# Patient Record
Sex: Female | Born: 1980 | Race: Black or African American | Hispanic: No | Marital: Married | Smoking: Never smoker
Health system: Southern US, Community
[De-identification: ages and names within clinical notes are randomized; demographics above are authoritative.]

## PROBLEM LIST (undated history)

## (undated) ENCOUNTER — Inpatient Hospital Stay (HOSPITAL_COMMUNITY): Payer: Self-pay

## (undated) DIAGNOSIS — R202 Paresthesia of skin: Secondary | ICD-10-CM

## (undated) DIAGNOSIS — M797 Fibromyalgia: Secondary | ICD-10-CM

## (undated) DIAGNOSIS — Z973 Presence of spectacles and contact lenses: Secondary | ICD-10-CM

## (undated) DIAGNOSIS — R200 Anesthesia of skin: Secondary | ICD-10-CM

## (undated) DIAGNOSIS — R110 Nausea: Secondary | ICD-10-CM

## (undated) DIAGNOSIS — I10 Essential (primary) hypertension: Secondary | ICD-10-CM

## (undated) DIAGNOSIS — M329 Systemic lupus erythematosus, unspecified: Secondary | ICD-10-CM

## (undated) DIAGNOSIS — K219 Gastro-esophageal reflux disease without esophagitis: Secondary | ICD-10-CM

## (undated) DIAGNOSIS — F419 Anxiety disorder, unspecified: Secondary | ICD-10-CM

## (undated) DIAGNOSIS — Z87898 Personal history of other specified conditions: Secondary | ICD-10-CM

## (undated) DIAGNOSIS — M351 Other overlap syndromes: Secondary | ICD-10-CM

## (undated) HISTORY — DX: Anxiety disorder, unspecified: F41.9

## (undated) HISTORY — PX: LIVER BIOPSY: SHX301

## (undated) HISTORY — DX: Anesthesia of skin: R20.0

## (undated) HISTORY — DX: Fibromyalgia: M79.7

## (undated) HISTORY — DX: Systemic lupus erythematosus, unspecified: M32.9

## (undated) HISTORY — DX: Paresthesia of skin: R20.2

## (undated) HISTORY — PX: BIOPSY STOMACH: PRO33

---

## 2001-01-10 ENCOUNTER — Other Ambulatory Visit: Admission: RE | Admit: 2001-01-10 | Discharge: 2001-01-10 | Payer: Self-pay | Admitting: Family Medicine

## 2002-05-20 ENCOUNTER — Encounter: Admission: RE | Admit: 2002-05-20 | Discharge: 2002-05-20 | Payer: Self-pay | Admitting: Family Medicine

## 2002-05-20 ENCOUNTER — Encounter: Payer: Self-pay | Admitting: Family Medicine

## 2003-06-12 ENCOUNTER — Inpatient Hospital Stay (HOSPITAL_COMMUNITY): Admission: AD | Admit: 2003-06-12 | Discharge: 2003-06-12 | Payer: Self-pay | Admitting: Obstetrics & Gynecology

## 2003-06-26 ENCOUNTER — Encounter: Admission: RE | Admit: 2003-06-26 | Discharge: 2003-06-26 | Payer: Self-pay | Admitting: Family Medicine

## 2003-07-03 ENCOUNTER — Encounter: Payer: Self-pay | Admitting: Family Medicine

## 2003-07-03 ENCOUNTER — Encounter: Admission: RE | Admit: 2003-07-03 | Discharge: 2003-07-03 | Payer: Self-pay | Admitting: Family Medicine

## 2003-07-08 ENCOUNTER — Inpatient Hospital Stay (HOSPITAL_COMMUNITY): Admission: AD | Admit: 2003-07-08 | Discharge: 2003-07-09 | Payer: Self-pay | Admitting: *Deleted

## 2003-08-06 ENCOUNTER — Encounter: Admission: RE | Admit: 2003-08-06 | Discharge: 2003-08-06 | Payer: Self-pay | Admitting: Sports Medicine

## 2003-08-12 ENCOUNTER — Encounter: Admission: RE | Admit: 2003-08-12 | Discharge: 2003-08-12 | Payer: Self-pay | Admitting: Family Medicine

## 2003-08-16 ENCOUNTER — Inpatient Hospital Stay (HOSPITAL_COMMUNITY): Admission: AD | Admit: 2003-08-16 | Discharge: 2003-08-16 | Payer: Self-pay | Admitting: Obstetrics & Gynecology

## 2003-09-27 ENCOUNTER — Inpatient Hospital Stay (HOSPITAL_COMMUNITY): Admission: AD | Admit: 2003-09-27 | Discharge: 2003-09-27 | Payer: Self-pay | Admitting: Obstetrics

## 2003-10-18 ENCOUNTER — Inpatient Hospital Stay (HOSPITAL_COMMUNITY): Admission: AD | Admit: 2003-10-18 | Discharge: 2003-10-18 | Payer: Self-pay | Admitting: Obstetrics

## 2003-12-22 ENCOUNTER — Inpatient Hospital Stay (HOSPITAL_COMMUNITY): Admission: AD | Admit: 2003-12-22 | Discharge: 2003-12-22 | Payer: Self-pay | Admitting: Obstetrics

## 2004-01-01 ENCOUNTER — Inpatient Hospital Stay (HOSPITAL_COMMUNITY): Admission: AD | Admit: 2004-01-01 | Discharge: 2004-01-01 | Payer: Self-pay | Admitting: Obstetrics

## 2004-01-03 ENCOUNTER — Inpatient Hospital Stay (HOSPITAL_COMMUNITY): Admission: AD | Admit: 2004-01-03 | Discharge: 2004-01-03 | Payer: Self-pay | Admitting: Obstetrics

## 2004-01-06 ENCOUNTER — Inpatient Hospital Stay (HOSPITAL_COMMUNITY): Admission: AD | Admit: 2004-01-06 | Discharge: 2004-01-06 | Payer: Self-pay | Admitting: Obstetrics

## 2004-01-08 ENCOUNTER — Inpatient Hospital Stay (HOSPITAL_COMMUNITY): Admission: AD | Admit: 2004-01-08 | Discharge: 2004-01-08 | Payer: Self-pay | Admitting: Obstetrics

## 2004-01-09 ENCOUNTER — Inpatient Hospital Stay (HOSPITAL_COMMUNITY): Admission: AD | Admit: 2004-01-09 | Discharge: 2004-01-11 | Payer: Self-pay | Admitting: Obstetrics

## 2004-01-20 ENCOUNTER — Inpatient Hospital Stay (HOSPITAL_COMMUNITY): Admission: AD | Admit: 2004-01-20 | Discharge: 2004-01-23 | Payer: Self-pay | Admitting: Obstetrics

## 2004-10-25 ENCOUNTER — Emergency Department (HOSPITAL_COMMUNITY): Admission: EM | Admit: 2004-10-25 | Discharge: 2004-10-25 | Payer: Self-pay | Admitting: Family Medicine

## 2005-05-01 ENCOUNTER — Emergency Department (HOSPITAL_COMMUNITY): Admission: EM | Admit: 2005-05-01 | Discharge: 2005-05-01 | Payer: Self-pay | Admitting: Emergency Medicine

## 2005-07-27 ENCOUNTER — Other Ambulatory Visit: Admission: RE | Admit: 2005-07-27 | Discharge: 2005-07-27 | Payer: Self-pay | Admitting: Family Medicine

## 2005-11-16 ENCOUNTER — Emergency Department (HOSPITAL_COMMUNITY): Admission: EM | Admit: 2005-11-16 | Discharge: 2005-11-16 | Payer: Self-pay | Admitting: Family Medicine

## 2006-05-06 ENCOUNTER — Emergency Department (HOSPITAL_COMMUNITY): Admission: EM | Admit: 2006-05-06 | Discharge: 2006-05-06 | Payer: Self-pay | Admitting: Family Medicine

## 2006-05-09 ENCOUNTER — Emergency Department (HOSPITAL_COMMUNITY): Admission: EM | Admit: 2006-05-09 | Discharge: 2006-05-09 | Payer: Self-pay | Admitting: Family Medicine

## 2006-05-12 ENCOUNTER — Encounter: Admission: RE | Admit: 2006-05-12 | Discharge: 2006-05-12 | Payer: Self-pay | Admitting: Family Medicine

## 2006-06-26 ENCOUNTER — Emergency Department (HOSPITAL_COMMUNITY): Admission: EM | Admit: 2006-06-26 | Discharge: 2006-06-27 | Payer: Self-pay | Admitting: Family Medicine

## 2006-10-17 ENCOUNTER — Emergency Department (HOSPITAL_COMMUNITY): Admission: EM | Admit: 2006-10-17 | Discharge: 2006-10-17 | Payer: Self-pay | Admitting: Family Medicine

## 2007-01-11 ENCOUNTER — Emergency Department (HOSPITAL_COMMUNITY): Admission: EM | Admit: 2007-01-11 | Discharge: 2007-01-11 | Payer: Self-pay | Admitting: Family Medicine

## 2007-02-07 ENCOUNTER — Other Ambulatory Visit: Admission: RE | Admit: 2007-02-07 | Discharge: 2007-02-07 | Payer: Self-pay | Admitting: Obstetrics and Gynecology

## 2007-04-11 ENCOUNTER — Emergency Department (HOSPITAL_COMMUNITY): Admission: EM | Admit: 2007-04-11 | Discharge: 2007-04-11 | Payer: Self-pay | Admitting: Emergency Medicine

## 2008-05-04 IMAGING — CT CT PELVIS W/ CM
4 of 5 series · 12 of 46 positions shown, 19 images · IV contrast (READY/WATER & [ID] OMNI 300)
Comparison: none

CLINICAL DATA: Left abdomen and left flank pain.  Some nausea and vomiting.
 ABDOMEN CT WITH CONTRAST:
TECHNIQUE: Multidetector CT imaging of the abdomen was performed following the standard protocol during bolus administration of intravenous contrast.
 Contrast:  100 cc Omnipaque 300 and oral contrast.
TECHNIQUE: Multidetector CT imaging of the pelvis was performed following the standard protocol during bolus administration of intravenous contrast.

[Series 2: a&p w/ · axial · 0.62mm/px · z∈[-337,-77]mm · 5 of 80 slices shown, 10 images (1 of 2)]
[im 14/80  soft-tissue]
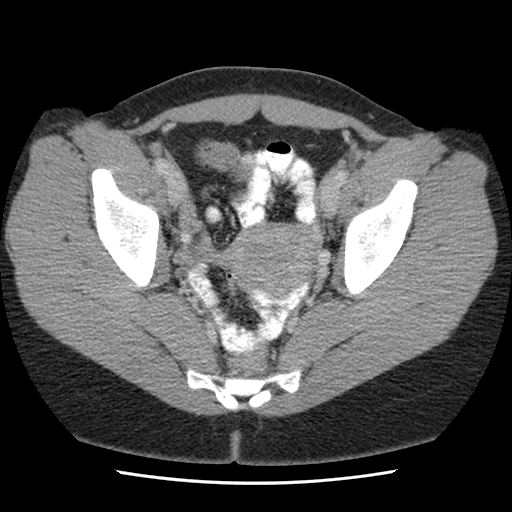
[im 14/80  bone]
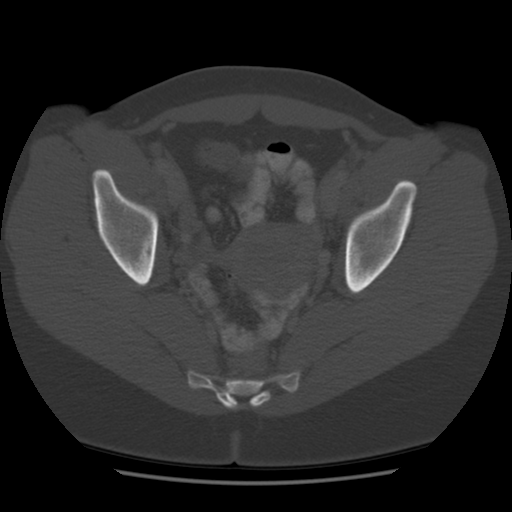
[im 27/80  soft-tissue]
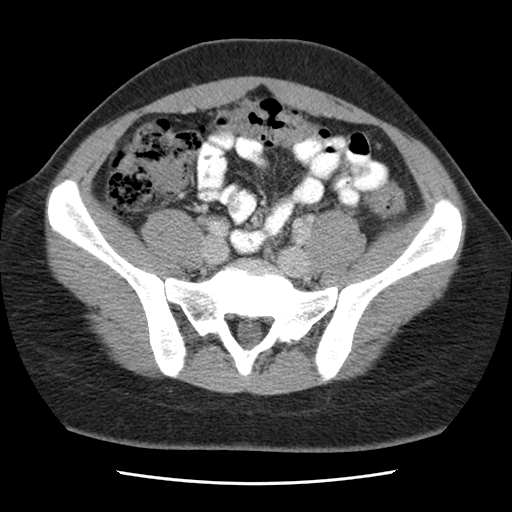
[im 27/80  lung]
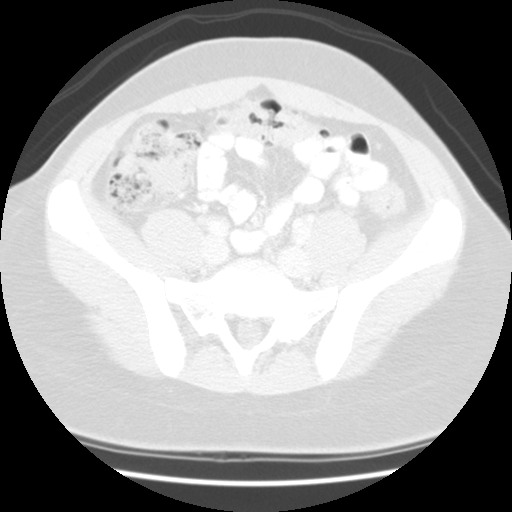
[im 40/80  soft-tissue]
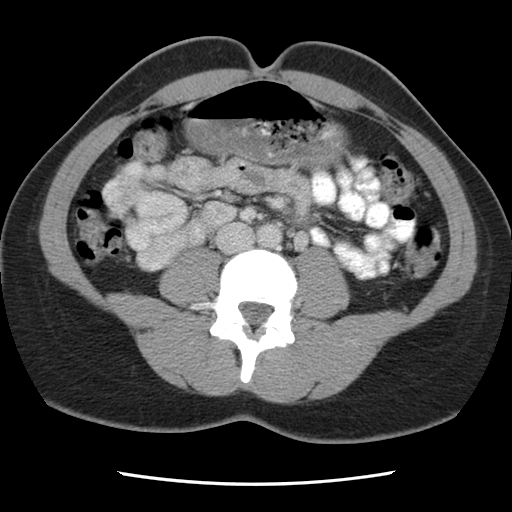
[im 40/80  lung]
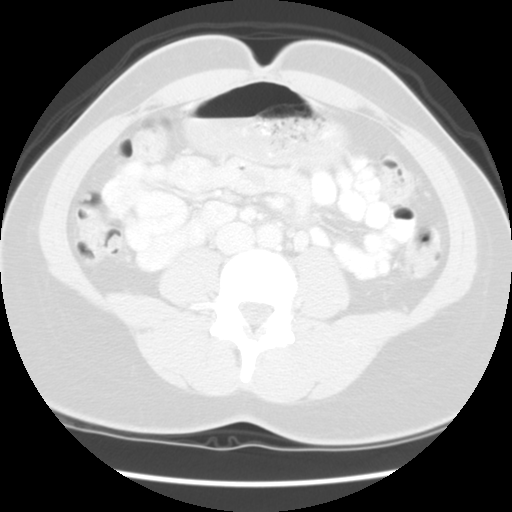
[im 53/80  soft-tissue]
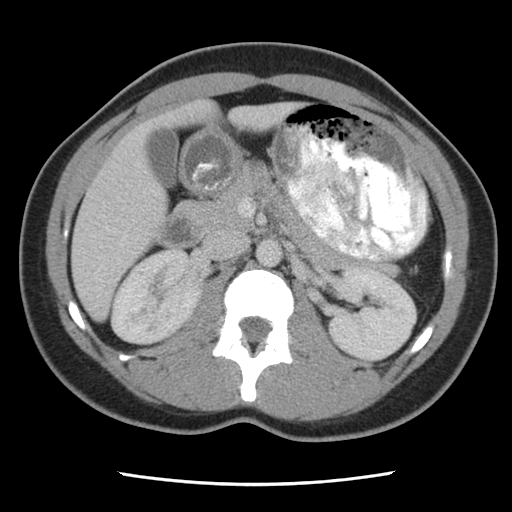
[im 53/80  lung]
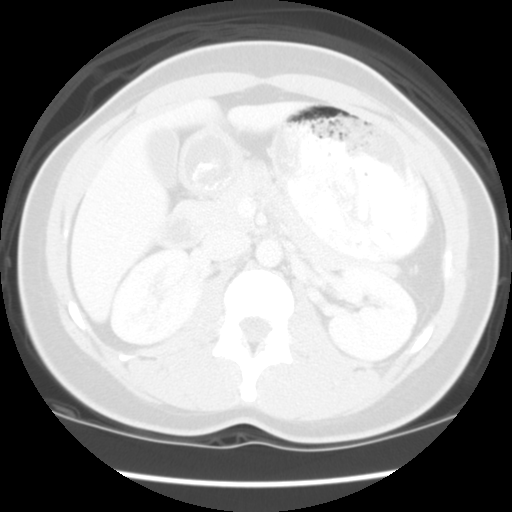
[im 66/80  soft-tissue]
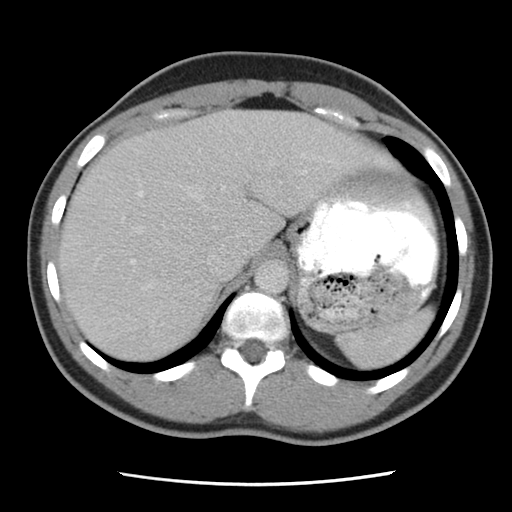
[im 66/80  lung]
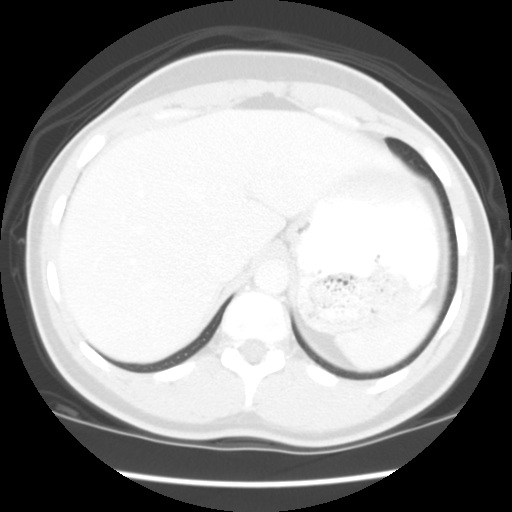

[Series 102: a&p w/ · axial · 0.62mm/px · z∈[-343,-260]mm · 3 of 296 slices shown (2 of 2)]
[im 27/296  soft-tissue]
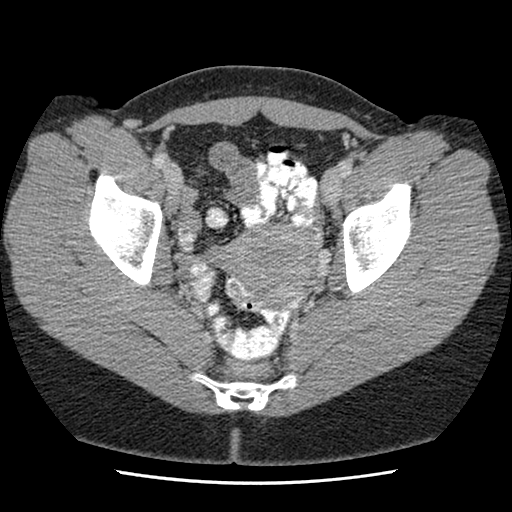
[im 54/296  soft-tissue]
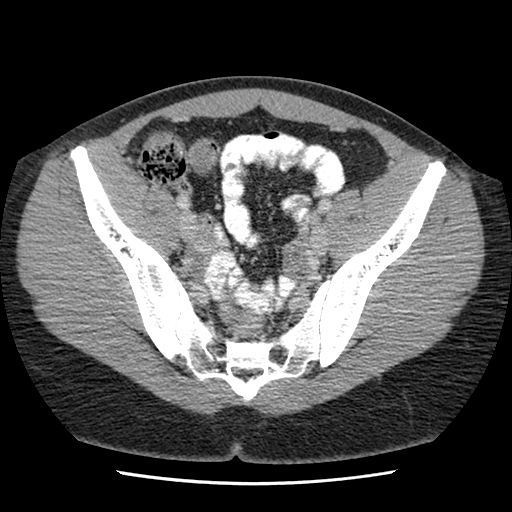
[im 94/296  soft-tissue]
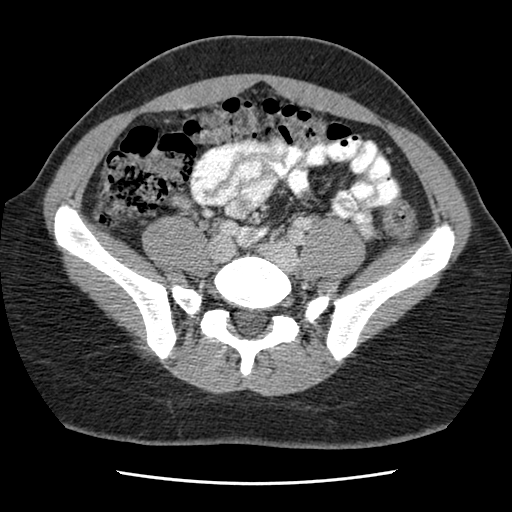

[Series 789: reformatted · sagittal · 0.82mm/px · 3 of 112 slices shown, 4 images (1 of 2)]
[im 38/112  soft-tissue]
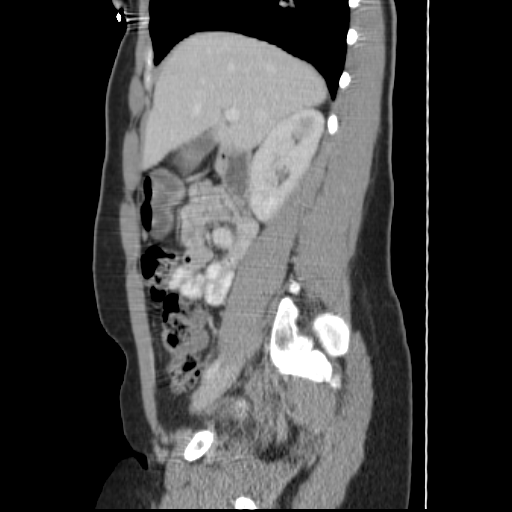
[im 50/112  soft-tissue]
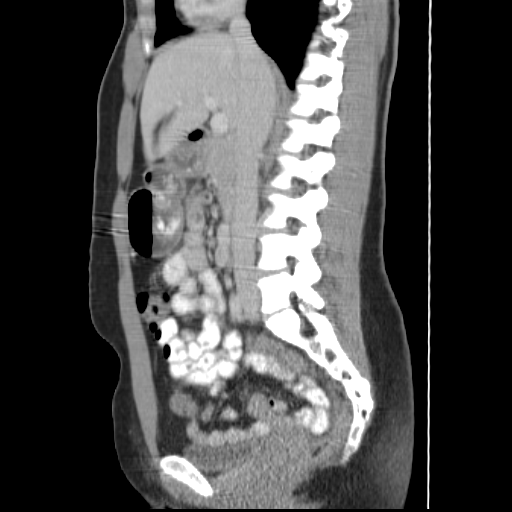
[im 50/112  bone]
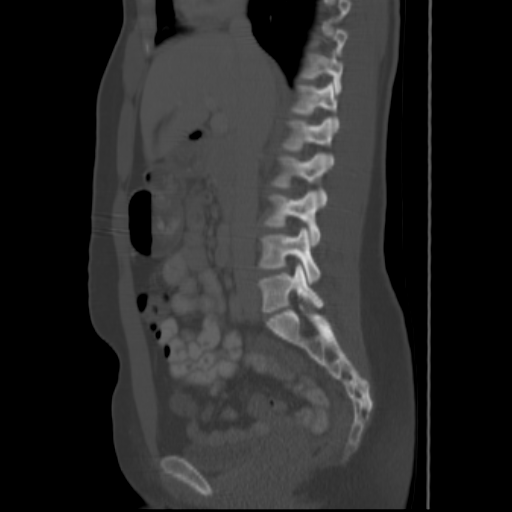
[im 62/112  soft-tissue]
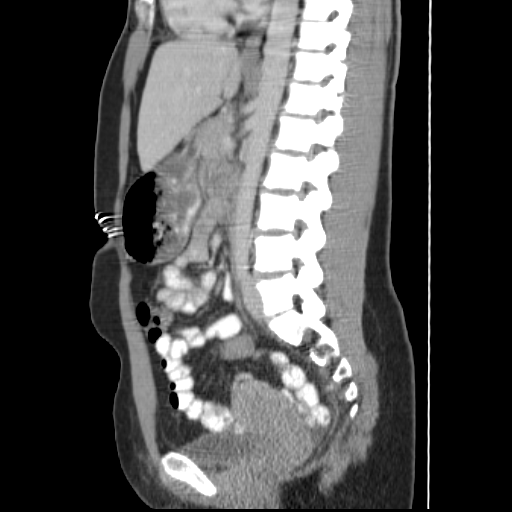

[Series 790: reformatted · coronal · 0.82mm/px · 1 of 86 slices shown, 2 images (2 of 2)]
[im 29/86  soft-tissue]
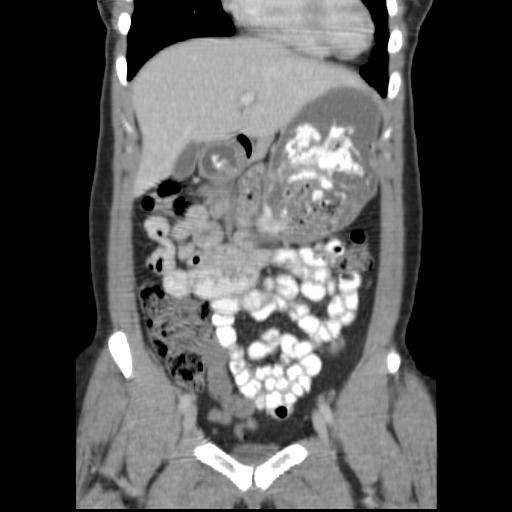
[im 29/86  bone]
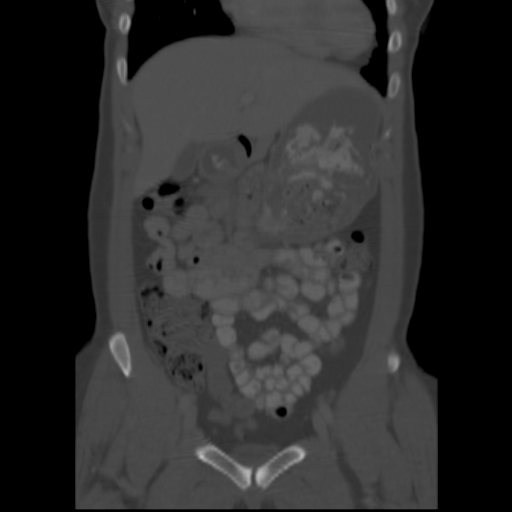

[12 of 46 positions shown; findings below may reference images not displayed]

FINDINGS: The lung bases are clear.  The liver enhances normally with no focal abnormality and no ductal dilatation is seen.  No calcified gallstones are noted. The pancreas is normal in size with normal peripancreatic fat planes.  The adrenal glands and spleen appear normal.  The kidneys enhance normally and on delayed images, the pelvocaliceal systems appear normal.  The abdominal aorta is normal in caliber.  No adenopathy is seen.
IMPRESSION: Negative CT of the abdomen.  
 PELVIS CT WITH CONTRAST:
FINDINGS: Scans were continued through the pelvis after oral and IV contrast media were given.  The urinary bladder is not distended.  The uterus is normal in size.  Only a small amount of fluid is noted in the cul-de-sac.  No adnexal lesion is seen.  The appendix and terminal ileum appear normal.
IMPRESSION: 1.  Negative CT of the pelvis.  Appendix and terminal ileum appear normal. 
 2.  Small amount of fluid in the cul-de-sac.

## 2008-06-18 ENCOUNTER — Other Ambulatory Visit: Admission: RE | Admit: 2008-06-18 | Discharge: 2008-06-18 | Payer: Self-pay | Admitting: Obstetrics and Gynecology

## 2009-01-23 ENCOUNTER — Emergency Department (HOSPITAL_COMMUNITY): Admission: EM | Admit: 2009-01-23 | Discharge: 2009-01-23 | Payer: Self-pay | Admitting: Emergency Medicine

## 2009-01-28 ENCOUNTER — Emergency Department (HOSPITAL_COMMUNITY): Admission: EM | Admit: 2009-01-28 | Discharge: 2009-01-28 | Payer: Self-pay | Admitting: Family Medicine

## 2009-06-25 ENCOUNTER — Emergency Department (HOSPITAL_COMMUNITY): Admission: EM | Admit: 2009-06-25 | Discharge: 2009-06-25 | Payer: Self-pay | Admitting: Emergency Medicine

## 2009-12-02 ENCOUNTER — Other Ambulatory Visit: Admission: RE | Admit: 2009-12-02 | Discharge: 2009-12-02 | Payer: Self-pay | Admitting: Family Medicine

## 2010-11-21 HISTORY — PX: WISDOM TOOTH EXTRACTION: SHX21

## 2011-02-26 LAB — CBC
HCT: 35.7 % — ABNORMAL LOW (ref 36.0–46.0)
MCHC: 32.9 g/dL (ref 30.0–36.0)
MCV: 80.2 fL (ref 78.0–100.0)
RBC: 4.46 MIL/uL (ref 3.87–5.11)
RDW: 14.1 % (ref 11.5–15.5)

## 2011-02-26 LAB — DIFFERENTIAL
Basophils Relative: 0 % (ref 0–1)
Eosinophils Absolute: 0 10*3/uL (ref 0.0–0.7)
Lymphs Abs: 1.5 10*3/uL (ref 0.7–4.0)
Monocytes Absolute: 0.4 10*3/uL (ref 0.1–1.0)
Monocytes Relative: 8 % (ref 3–12)
Neutro Abs: 2.4 10*3/uL (ref 1.7–7.7)
Neutrophils Relative %: 57 % (ref 43–77)

## 2011-02-26 LAB — COMPREHENSIVE METABOLIC PANEL
ALT: 9 U/L (ref 0–35)
Alkaline Phosphatase: 66 U/L (ref 39–117)
BUN: 6 mg/dL (ref 6–23)
CO2: 24 mEq/L (ref 19–32)
Calcium: 9 mg/dL (ref 8.4–10.5)
GFR calc Af Amer: >60 mL/min (ref >60)
Glucose, Bld: 87 mg/dL (ref 70–99)
Potassium: 3.5 mEq/L (ref 3.5–5.1)
Total Bilirubin: 0.4 mg/dL (ref 0.3–1.2)
Total Protein: 7.6 g/dL (ref 6.0–8.3)

## 2011-02-26 LAB — TSH: TSH: 0.98 u[IU]/mL (ref 0.350–4.500)

## 2011-08-26 ENCOUNTER — Encounter (HOSPITAL_COMMUNITY): Payer: Self-pay

## 2011-08-26 ENCOUNTER — Inpatient Hospital Stay (HOSPITAL_COMMUNITY)
Admission: AD | Admit: 2011-08-26 | Discharge: 2011-08-26 | Disposition: A | Discharge status: Home / Self Care | Payer: BC Managed Care – PPO | Source: Ambulatory Visit | Attending: Obstetrics and Gynecology | Admitting: Obstetrics and Gynecology

## 2011-08-26 DIAGNOSIS — Z30432 Encounter for removal of intrauterine contraceptive device: Secondary | ICD-10-CM | POA: Insufficient documentation

## 2011-08-26 DIAGNOSIS — I10 Essential (primary) hypertension: Secondary | ICD-10-CM | POA: Insufficient documentation

## 2011-08-26 DIAGNOSIS — N912 Amenorrhea, unspecified: Secondary | ICD-10-CM | POA: Insufficient documentation

## 2011-08-26 HISTORY — DX: Essential (primary) hypertension: I10

## 2011-08-26 LAB — URINALYSIS, ROUTINE W REFLEX MICROSCOPIC
Glucose, UA: NEGATIVE mg/dL
Leukocytes, UA: NEGATIVE
Nitrite: NEGATIVE
Protein, ur: NEGATIVE mg/dL

## 2011-08-26 LAB — POCT PREGNANCY, URINE
Preg Test, Ur: NEGATIVE
Preg Test, Ur: NEGATIVE

## 2011-08-26 LAB — HCG, QUANTITATIVE, PREGNANCY: hCG, Beta Chain, Quant, S: 1 m[IU]/mL (ref <5)

## 2011-08-26 NOTE — Progress Notes (Signed)
Pt states, " I had my IUD for 5 yrs and never missed my period until now. I had a pos. HPT, and a Neg HPT.i want my IUD out reguardles."

## 2011-08-26 NOTE — Progress Notes (Signed)
Patient has iud placed about 5 years ago. Her lmp 07/19/11. She had 1+upt and 1  Negative. Denies any vaginal bleeding or discharge. Has lower abdominal cramping that is intermittent . Started on Monday.

## 2011-08-26 NOTE — ED Provider Notes (Signed)
Michelle Rojas [id number] y.o.[id number]  Chief Complaint  Patient presents with  . Amenorrhea    SUBJECTIVE  HPI:  LMP 07/19/11 with normal cycle interval 4 wks. She had one pos and one neg HPT. She requests removal of her Mirena IUD which has been in place for 5 years and one month. She has never felt the strings. She has some mild lower abdominal cramping which she describes as premenstrual cramps. Has not had subjective symptoms of pregnancy. Denies dysuria or irritative vaginal discharge. She is undecided about future contraception. She is followed by Dr. Lorn Junes at [facility] for her chronic hypertension and takes her hydrochlorothiazide as directed.    Past Medical History  Diagnosis Date  . Hypertension     on hctz 25mg  daily   Ob Hx: NSVD Gyn Hx: neg for STIs History reviewed. No pertinent past surgical history. No Known Allergies  ROS: pertinent items in HPI  OBJECTIVE  BP 186/112  Pulse 91  Temp(Src) 98.1 F (36.7 C) (Oral)  Resp 20  Ht 5\' 6"  (1.676 m)  Wt 83.462 kg (184 lb)  BMI 29.70 kg/m2   Physical Exam:  General: Pleasant WN/WD female in NAD Abd: soft, NT Pelvic: NEFG             Spec: vag clean with physiologic discharge; cx with sm Nabothian cyst, IUD strings protruding  2 cm from ext os grasped with sponge forceps and removed easily, minimal spotting; pt   tolerated procedure well  Uterus: NSSP  Adnexae without tenderness or masses Results for orders placed during the hospital encounter of 08/26/11 (from the past 24 hour(s))  POCT PREGNANCY, URINE     Status: Normal   Collection Time   08/26/11  6:20 PM      Component Value Range   Preg Test, Ur NEGATIVE    POCT PREGNANCY, URINE     Status: Normal   Collection Time   08/26/11  8:21 PM      Component Value Range   Preg Test, Ur NEGATIVE    URINALYSIS, ROUTINE W REFLEX MICROSCOPIC     Status: Normal   Collection Time   08/26/11  8:21 PM      Component Value Range   Color, Urine YELLOW  YELLOW      Appearance CLEAR  CLEAR    Specific Gravity, Urine 1.020  1.005 - 1.030    pH 7.0  5.0 - 8.0    Glucose, UA NEGATIVE  NEGATIVE (mg/dL)   Hgb urine dipstick NEGATIVE  NEGATIVE    Bilirubin Urine NEGATIVE  NEGATIVE    Ketones, ur NEGATIVE  NEGATIVE (mg/dL)   Protein, ur NEGATIVE  NEGATIVE (mg/dL)   Urobilinogen, UA 0.2  0.0 - 1.0 (mg/dL)   Nitrite NEGATIVE  NEGATIVE    Leukocytes, UA NEGATIVE  NEGATIVE   HCG, QUANTITATIVE, PREGNANCY     Status: Normal   Collection Time   08/26/11  8:58 PM      Component Value Range   hCG, Beta Chain, Quant, S 1  <5 (mIU/mL)   ASSESSMENT Pregnancy ruled out Mirena IUD removal CHTN not well controlled   PLAN D/W Dr. Paul Half Safe sex and contraceptive options discussed. Use condoms or abstain. F/U with Dr. Richardson Dopp. She will call her PMD at [facility] Monday for appointment regarding [facility] management.

## 2011-08-26 NOTE — Progress Notes (Signed)
Dr Paul Half notified of patient. Negative upt here and he ordered bhcg and have midlevel provider evaluate.

## 2011-09-06 NOTE — ED Provider Notes (Signed)
Patient was discussed with PA only after IUD removed.  Neg urine and serum pregnancy tests noted.  Pt told by PA to use condoms.  I asked that the patient be followed up by the PCP asap to determine new method of contraception.

## 2013-11-30 ENCOUNTER — Encounter (HOSPITAL_COMMUNITY): Payer: Self-pay | Admitting: Emergency Medicine

## 2013-11-30 ENCOUNTER — Emergency Department (HOSPITAL_COMMUNITY)
Admission: EM | Admit: 2013-11-30 | Discharge: 2013-11-30 | Disposition: A | Discharge status: Home / Self Care | Payer: 59 | Attending: Emergency Medicine | Admitting: Emergency Medicine

## 2013-11-30 DIAGNOSIS — R1032 Left lower quadrant pain: Secondary | ICD-10-CM | POA: Insufficient documentation

## 2013-11-30 DIAGNOSIS — Z79899 Other long term (current) drug therapy: Secondary | ICD-10-CM | POA: Insufficient documentation

## 2013-11-30 DIAGNOSIS — R109 Unspecified abdominal pain: Secondary | ICD-10-CM

## 2013-11-30 DIAGNOSIS — Z3202 Encounter for pregnancy test, result negative: Secondary | ICD-10-CM | POA: Insufficient documentation

## 2013-11-30 DIAGNOSIS — Z792 Long term (current) use of antibiotics: Secondary | ICD-10-CM | POA: Insufficient documentation

## 2013-11-30 DIAGNOSIS — R197 Diarrhea, unspecified: Secondary | ICD-10-CM | POA: Insufficient documentation

## 2013-11-30 DIAGNOSIS — I10 Essential (primary) hypertension: Secondary | ICD-10-CM | POA: Insufficient documentation

## 2013-11-30 DIAGNOSIS — R110 Nausea: Secondary | ICD-10-CM | POA: Insufficient documentation

## 2013-11-30 LAB — CBC WITH DIFFERENTIAL/PLATELET
BASOS PCT: 0 % (ref 0–1)
Basophils Absolute: 0 10*3/uL (ref 0.0–0.1)
EOS PCT: 1 % (ref 0–5)
Eosinophils Absolute: 0 10*3/uL (ref 0.0–0.7)
HCT: 35.8 % — ABNORMAL LOW (ref 36.0–46.0)
Hemoglobin: 12 g/dL (ref 12.0–15.0)
LYMPHS PCT: 45 % (ref 12–46)
Lymphs Abs: 2.3 10*3/uL (ref 0.7–4.0)
MCH: 26.5 pg (ref 26.0–34.0)
MCHC: 33.5 g/dL (ref 30.0–36.0)
MCV: 79 fL (ref 78.0–100.0)
MONO ABS: 0.5 10*3/uL (ref 0.1–1.0)
Monocytes Relative: 9 % (ref 3–12)
NEUTROS ABS: 2.3 10*3/uL (ref 1.7–7.7)
Neutrophils Relative %: 45 % (ref 43–77)
Platelets: 250 10*3/uL (ref 150–400)
RBC: 4.53 MIL/uL (ref 3.87–5.11)
RDW: 14 % (ref 11.5–15.5)
WBC: 5.1 10*3/uL (ref 4.0–10.5)

## 2013-11-30 LAB — COMPREHENSIVE METABOLIC PANEL
ALBUMIN: 3.9 g/dL (ref 3.5–5.2)
ALT: 26 U/L (ref 0–35)
AST: 26 U/L (ref 0–37)
Alkaline Phosphatase: 61 U/L (ref 39–117)
BUN: 10 mg/dL (ref 6–23)
CALCIUM: 9.4 mg/dL (ref 8.4–10.5)
CHLORIDE: 103 meq/L (ref 96–112)
CO2: 30 meq/L (ref 19–32)
Creatinine, Ser: 0.9 mg/dL (ref 0.50–1.10)
GFR, EST NON AFRICAN AMERICAN: 82 mL/min — AB (ref >90)
GLUCOSE: 98 mg/dL (ref 70–99)
Potassium: 3.6 mEq/L — ABNORMAL LOW (ref 3.7–5.3)
Sodium: 141 mEq/L (ref 137–147)
Total Bilirubin: 0.2 mg/dL — ABNORMAL LOW (ref 0.3–1.2)
Total Protein: 8.2 g/dL (ref 6.0–8.3)

## 2013-11-30 LAB — URINALYSIS, ROUTINE W REFLEX MICROSCOPIC
BILIRUBIN URINE: NEGATIVE
Glucose, UA: NEGATIVE mg/dL
HGB URINE DIPSTICK: NEGATIVE
Ketones, ur: NEGATIVE mg/dL
LEUKOCYTES UA: NEGATIVE
Nitrite: NEGATIVE
PH: 6 (ref 5.0–8.0)
PROTEIN: NEGATIVE mg/dL
SPECIFIC GRAVITY, URINE: 1.033 — AB (ref 1.005–1.030)
UROBILINOGEN UA: 0.2 mg/dL (ref 0.0–1.0)

## 2013-11-30 LAB — POCT PREGNANCY, URINE: Preg Test, Ur: NEGATIVE

## 2013-11-30 MED ORDER — POLYETHYLENE GLYCOL 3350 17 G PO PACK: 17.0000 g | PACK | Freq: Every day | ORAL | Status: DC

## 2013-11-30 MED ORDER — DICYCLOMINE HCL 20 MG PO TABS: 20.0000 mg | ORAL_TABLET | Freq: Two times a day (BID) | ORAL | Status: DC

## 2013-11-30 NOTE — Discharge Instructions (Signed)
Abdominal Pain, Women °Abdominal (stomach, pelvic, or belly) pain can be caused by many things. It is important to tell your doctor: °· The location of the pain. °· Does it come and go or is it present all the time? °· Are there things that start the pain (eating certain foods, exercise)? °· Are there other symptoms associated with the pain (fever, nausea, vomiting, diarrhea)? °All of this is helpful to know when trying to find the cause of the pain. °CAUSES  °· Stomach: virus or bacteria infection, or ulcer. °· Intestine: appendicitis (inflamed appendix), regional ileitis (Crohn's disease), ulcerative colitis (inflamed colon), irritable bowel syndrome, diverticulitis (inflamed diverticulum of the colon), or cancer of the stomach or intestine. °· Gallbladder disease or stones in the gallbladder. °· Kidney disease, kidney stones, or infection. °· Pancreas infection or cancer. °· Fibromyalgia (pain disorder). °· Diseases of the female organs: °· Uterus: fibroid (non-cancerous) tumors or infection. °· Fallopian tubes: infection or tubal pregnancy. °· Ovary: cysts or tumors. °· Pelvic adhesions (scar tissue). °· Endometriosis (uterus lining tissue growing in the pelvis and on the pelvic organs). °· Pelvic congestion syndrome (female organs filling up with blood just before the menstrual period). °· Pain with the menstrual period. °· Pain with ovulation (producing an egg). °· Pain with an IUD (intrauterine device, birth control) in the uterus. °· Cancer of the female organs. °· Functional pain (pain not caused by a disease, may improve without treatment). °· Psychological pain. °· Depression. °DIAGNOSIS  °Your doctor will decide the seriousness of your pain by doing an examination. °· Blood tests. °· X-rays. °· Ultrasound. °· CT scan (computed tomography, special type of X-ray). °· MRI (magnetic resonance imaging). °· Cultures, for infection. °· Barium enema (dye inserted in the large intestine, to better view it with  X-rays). °· Colonoscopy (looking in intestine with a lighted tube). °· Laparoscopy (minor surgery, looking in abdomen with a lighted tube). °· Major abdominal exploratory surgery (looking in abdomen with a large incision). °TREATMENT  °The treatment will depend on the cause of the pain.  °· Many cases can be observed and treated at home. °· Over-the-counter medicines recommended by your caregiver. °· Prescription medicine. °· Antibiotics, for infection. °· Birth control pills, for painful periods or for ovulation pain. °· Hormone treatment, for endometriosis. °· Nerve blocking injections. °· Physical therapy. °· Antidepressants. °· Counseling with a psychologist or psychiatrist. °· Minor or major surgery. °HOME CARE INSTRUCTIONS  °· Do not take laxatives, unless directed by your caregiver. °· Take over-the-counter pain medicine only if ordered by your caregiver. Do not take aspirin because it can cause an upset stomach or bleeding. °· Try a clear liquid diet (broth or water) as ordered by your caregiver. Slowly move to a bland diet, as tolerated, if the pain is related to the stomach or intestine. °· Have a thermometer and take your temperature several times a day, and record it. °· Bed rest and sleep, if it helps the pain. °· Avoid sexual intercourse, if it causes pain. °· Avoid stressful situations. °· Keep your follow-up appointments and tests, as your caregiver orders. °· If the pain does not go away with medicine or surgery, you may try: °· Acupuncture. °· Relaxation exercises (yoga, meditation). °· Group therapy. °· Counseling. °SEEK MEDICAL CARE IF:  °· You notice certain foods cause stomach pain. °· Your home care treatment is not helping your pain. °· You need stronger pain medicine. °· You want your IUD removed. °· You feel faint or   lightheaded. °· You develop nausea and vomiting. °· You develop a rash. °· You are having side effects or an allergy to your medicine. °SEEK IMMEDIATE MEDICAL CARE IF:  °· Your  pain does not go away or gets worse. °· You have a fever. °· Your pain is felt only in portions of the abdomen. The right side could possibly be appendicitis. The left lower portion of the abdomen could be colitis or diverticulitis. °· You are passing blood in your stools (bright red or black tarry stools, with or without vomiting). °· You have blood in your urine. °· You develop chills, with or without a fever. °· You pass out. °MAKE SURE YOU:  °· Understand these instructions. °· Will watch your condition. °· Will get help right away if you are not doing well or get worse. °Document Released: 09/04/2007 Document Revised: 01/30/2012 Document Reviewed: 09/24/2009 °Exit[employer]® Patient Information ©2014 [employer]. ° °

## 2013-11-30 NOTE — ED Provider Notes (Signed)
I saw and evaluated the patient, reviewed the resident's note and I agree with the findings and plan.  EKG Interpretation   None        Pt with waxing and waning LLQ abd pain, sharp/cramping, ongoing for 2 months. No vomiting, fever, dysuria. No blood in stool but has had a lot of gas. Abd benign, no peritoneal signs, labs unremarkable. No indication for emergent imaging. Advised bentyl and GI followup for further eval.   Larrisha Babineau B. Bernette MayersSheldon, MD 11/30/13 1745

## 2013-11-30 NOTE — ED Provider Notes (Signed)
CSN: [id number]     Arrival date & time 11/30/13  1505 History   First MD Initiated Contact with Patient 11/30/13 1629     Chief Complaint  Patient presents with  . Abdominal Pain   (Consider location/radiation/quality/duration/timing/severity/associated sxs/prior Treatment) HPI  33 year old female with a history of hypertension who presents today with abdominal pain.  Patient has abdominal pain in her left lower quadrant which she describes as sharp and intermittent as well as dull and constant. She states it is been ongoing for the last 2 months. She states that she went to urgent care on Monday. She states that she has had some mild vaginal bleeding and because of that was evaluated by a gynecologist yesterday for concern for her IUD, and there was no problem with it. She presents now for continued pain in further evaluation.  Past Medical History  Diagnosis Date  . Hypertension     on hctz 25mg  daily   History reviewed. No pertinent past surgical history. History reviewed. No pertinent family history. History  Substance Use Topics  . Smoking status: Never Smoker   . Smokeless tobacco: Not on file  . Alcohol Use: Not on file   OB History   Grav Para Term Preterm Abortions TAB SAB Ect Mult Living   1 1 1       1      Review of Systems  Constitutional: Negative for fever and chills.  HENT: Negative for sore throat.   Eyes: Negative for pain.  Respiratory: Negative for cough and shortness of breath.   Cardiovascular: Negative for chest pain.  Gastrointestinal: Positive for nausea and diarrhea. Negative for vomiting and abdominal pain.  Genitourinary: Negative for dysuria.  Musculoskeletal: Negative for back pain.  Skin: Negative for rash.  Neurological: Negative for numbness and headaches.    Allergies  Review of patient's allergies indicates no known allergies.  Home Medications   Current Outpatient Rx  Name  Route  Sig  Dispense  Refill  . acetaminophen (TYLENOL)  500 MG tablet   Oral   Take 1,000 mg by mouth daily as needed for mild pain.          Marland Kitchen HYDROcodone-acetaminophen (NORCO/VICODIN) 5-325 MG per tablet   Oral   Take 1 tablet by mouth every 6 (six) hours as needed for moderate pain.         Marland Kitchen HYDROcodone-homatropine (HYCODAN) 5-1.5 MG/5ML syrup   Oral   Take 5 mLs by mouth every 6 (six) hours as needed for cough.         Marland Kitchen levofloxacin (LEVAQUIN) 500 MG tablet   Oral   Take 500 mg by mouth daily.         Marland Kitchen losartan-hydrochlorothiazide (HYZAAR) 50-12.5 MG per tablet   Oral   Take 1 tablet by mouth daily.         Marland Kitchen dicyclomine (BENTYL) 20 MG tablet   Oral   Take 1 tablet (20 mg total) by mouth 2 (two) times daily.   20 tablet   0   . polyethylene glycol (MIRALAX) packet   Oral   Take 17 g by mouth daily.   14 each   0    BP 150/96  Pulse 66  Temp(Src) 98.6 F (37 C) (Oral)  Resp 18  Wt 202 lb (91.627 kg)  SpO2 100% Physical Exam  Constitutional: She is oriented to person, place, and time. She appears well-developed and well-nourished. No distress.  HENT:  Head: Normocephalic and atraumatic.  Eyes:  Pupils are equal, round, and reactive to light. Right eye exhibits no discharge. Left eye exhibits no discharge.  Neck: Normal range of motion.  Cardiovascular: Normal rate, regular rhythm and normal heart sounds.   Pulmonary/Chest: Effort normal and breath sounds normal.  Abdominal: Soft. She exhibits no distension. There is no tenderness. There is no rigidity, no guarding and no tenderness at McBurney's point.  Musculoskeletal: Normal range of motion.  Neurological: She is alert and oriented to person, place, and time.  Skin: Skin is warm. She is not diaphoretic.    ED Course  Procedures (including critical care time) Labs Review Labs Reviewed  COMPREHENSIVE METABOLIC PANEL - Abnormal; Notable for the following:    Potassium 3.6 (*)    Total Bilirubin 0.2 (*)    GFR calc non Af Amer 82 (*)    All other  components within normal limits  CBC WITH DIFFERENTIAL - Abnormal; Notable for the following:    HCT 35.8 (*)    All other components within normal limits  URINALYSIS, ROUTINE W REFLEX MICROSCOPIC - Abnormal; Notable for the following:    Specific Gravity, Urine 1.033 (*)    All other components within normal limits  POCT PREGNANCY, URINE   Imaging Review No results found.  EKG Interpretation   None       MDM   1. Abdominal pain    33 year old female with history of hypertension presents with abdominal pain and left lower quadrant.  Description as above, including physical exam findings, the patient does not have an acute abdomen nor appears in any distress. She is hemodynamically stable. She is mildly hypertensive which is consistent with her history of hypertension, not taking her home medications and she plans to begin doing so again. Her abdomen is soft and nontender and nondistended. Review of her labs from triage and she no acute findings. Doubt appendicitis diverticulitis diverticulosis bowel ischemia. IBS vs constipation.   No acute findings on labs. Will discharge with instructions to follow-up with PCP. Recommend bentyl and miralax for abdominal pain and symptoms. Patient voices understanding, and feels comfortable with discharge. Strict return precautions given. Discharged to home in stable condition. Patient seen and evaluated by myself and my attending, Dr. Bernette MayersSheldon.     Imagene ShellerSteve Stephenia Vogan, MD 12/01/13 [id number]  Imagene ShellerSteve Jumaane Weatherford, MD 12/01/13 [phone]

## 2013-11-30 NOTE — ED Notes (Signed)
Pt states shes had L sided abd pain since thanksgiving. She went to [facility] and gyn md this week with no diagnosis. States the pain was so bad last night she could not sleep. States shes had nausea and diarrhea also

## 2013-12-12 ENCOUNTER — Encounter (HOSPITAL_COMMUNITY): Payer: Self-pay | Admitting: Emergency Medicine

## 2013-12-12 ENCOUNTER — Emergency Department (HOSPITAL_COMMUNITY)
Admission: EM | Admit: 2013-12-12 | Discharge: 2013-12-12 | Disposition: A | Discharge status: Home / Self Care | Payer: 59 | Source: Home / Self Care | Attending: Emergency Medicine | Admitting: Emergency Medicine

## 2013-12-12 DIAGNOSIS — R002 Palpitations: Secondary | ICD-10-CM

## 2013-12-12 LAB — TSH: TSH: 1.144 u[IU]/mL (ref 0.350–4.500)

## 2013-12-12 LAB — POCT I-STAT, CHEM 8
BUN: 12 mg/dL (ref 6–23)
CALCIUM ION: 1.23 mmol/L (ref 1.12–1.23)
CHLORIDE: 108 meq/L (ref 96–112)
Creatinine, Ser: 0.9 mg/dL (ref 0.50–1.10)
GLUCOSE: 88 mg/dL (ref 70–99)
HEMATOCRIT: 38 % (ref 36.0–46.0)
HEMOGLOBIN: 12.9 g/dL (ref 12.0–15.0)
Potassium: 3.9 mEq/L (ref 3.7–5.3)
Sodium: 145 mEq/L (ref 137–147)
TCO2: 23 mmol/L (ref 0–100)

## 2013-12-12 NOTE — ED Notes (Signed)
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

## 2013-12-12 NOTE — ED Provider Notes (Signed)
Chief Complaint:   Chief Complaint  Patient presents with  . Palpitations    History of Present Illness:   Michelle Rojas is a 33 year old female who has had a three-day history of recurring palpitations. Episodes come and go and can last 2-3 seconds at a time it feels like her heart speeds up and slows down and sometimes feels like it's beating irregularly. She has never checked her pulse during any of these episodes last night she woke up in the middle of the night didn't like she was gasping for air. She also has had sharp substernal chest pains lasting for a second or 2 at a time. This tends to occur while walking. It's not necessarily related to the palpitations. She's felt lightheaded and dizzy. She denies any vertigo or presyncope. No syncope. Lightheaded sensations don't necessarily correlate with the rapid heartbeat. Patient also notes her legs, feet, and hands feel numb and tingly. She denies any medications that might be causing these symptoms, denies anxiety or stress, and has not had any history of cardiac problems in the past. She has no history of thyroid disease.  Review of Systems:  Other than noted above, the patient denies any of the following symptoms. Systemic:  No fever, chills, or fatigue. Pulmonary:  No cough, wheezing, shortness of breath. Cardiac:  No chest pain, tightness, pressure, dizziness, presyncope, syncope, PND, orthopnea, or edema. Ext:  No leg pain or swelling. Neuro:  No weakness, paresthesias, or difficulty with speech or gait. Psych:  No anxiety or depression. Endo:  No weight loss, tremor, sweats, or heat intolerance.   PMFSH:  Past medical history, family history, social history, meds, and allergies were reviewed and updated as needed. No history of cardiac disease.  No history of excessive alcohol intake.  She has no medication allergies. She has a Mirena IUD and takes fosinopril/hydrochlorothiazide for high blood pressure.  Physical Exam:   Vital  signs:  BP 137/98  Pulse 73  Temp(Src) 98.2 F (36.8 C) (Oral)  Resp 18  SpO2 100%  LMP 11/17/2013 Gen:  Alert, oriented, in no distress, skin warm and dry. Eye:  PERRL, lids and conjunctivas normal.  No stare or lid lag. ENT:  Mucous membranes moist, pharynx clear. Neck:  Supple, no adenopathy or tenderness.  No JVD.  Thyroid not enlarged. Lungs:  Clear to auscultation, no wheezes, rales or rhonchi.  No respiratory distress. Heart:  Regular rhythm, no extrasystoles.  No gallops, murmers, clicks or rubs. Abdomen:  Soft, nontender, no organomegaly or mass.  Bowel sounds normal.  No pulsatile abdominal mass or bruit. Ext:  No edema. Pulses full and equal. Skin:  Warm and dry.  No rash.  Labs:   Results for orders placed during the hospital encounter of 12/12/13  POCT I-STAT, CHEM 8      Result Value Range   Sodium 145  137 - 147 mEq/L   Potassium 3.9  3.7 - 5.3 mEq/L   Chloride 108  96 - 112 mEq/L   BUN 12  6 - 23 mg/dL   Creatinine, Ser 1.610.90  0.50 - 1.10 mg/dL   Glucose, Bld 88  70 - 99 mg/dL   Calcium, Ion 0.961.23  0.451.12 - 1.23 mmol/L   TCO2 23  0 - 100 mmol/L   Hemoglobin 12.9  12.0 - 15.0 g/dL   HCT 40.938.0  81.136.0 - 91.446.0 %    A TSH was obtained.  EKG:   Date: 12/12/2013  Rate: 72  Rhythm: normal sinus  rhythm  QRS Axis: normal  Intervals: normal  ST/T Wave abnormalities: normal  Conduction Disutrbances:none  Narrative Interpretation: Normal sinus rhythm, normal EKG.  Old EKG Reviewed: none available  Assessment:  The encounter diagnosis was Palpitations.  No obvious cause for palpitations were found she will need followup with cardiology.  Plan:   1.  Meds:  The following meds were prescribed:   Discharge Medication List as of 12/12/2013  9:57 AM      2.  Patient Education/Counseling:  The patient was given appropriate handouts, self care instructions, and instructed in symptomatic relief.  Advised to avoid caffeine and get extra fluids.  3.  Follow up:  The patient  was told to follow up if no better in 3 to 4 days, if becoming worse in any way, and given some red flag symptoms such as syncope, presyncope, chest pain or dyspnea which would prompt immediate return.  Follow up with Dr. Charlton Haws as soon as possible.       Reuben Likes, MD 12/12/13 [phone]

## 2013-12-12 NOTE — Discharge Instructions (Signed)
Avoid caffeine and get extra fluids.  We will call about result of thyroid test.    Palpitations  A palpitation is the feeling that your heartbeat is irregular or is faster than normal. It may feel like your heart is fluttering or skipping a beat. Palpitations are usually not a serious problem. However, in some cases, you may need further medical evaluation. CAUSES  Palpitations can be caused by:  Smoking.  Caffeine or other stimulants, such as diet pills or energy drinks.  Alcohol.  Stress and anxiety.  Strenuous physical activity.  Fatigue.  Certain medicines.  Heart disease, especially if you have a history of arrhythmias. This includes atrial fibrillation, atrial flutter, or supraventricular tachycardia.  An improperly working pacemaker or defibrillator. DIAGNOSIS  To find the cause of your palpitations, your caregiver will take your history and perform a physical exam. Tests may also be done, including:  Electrocardiography (ECG). This test records the heart's electrical activity.  Cardiac monitoring. This allows your caregiver to monitor your heart rate and rhythm in real time.  Holter monitor. This is a portable device that records your heartbeat and can help diagnose heart arrhythmias. It allows your caregiver to track your heart activity for several days, if needed.  Stress tests by exercise or by giving medicine that makes the heart beat faster. TREATMENT  Treatment of palpitations depends on the cause of your symptoms and can vary greatly. Most cases of palpitations do not require any treatment other than time, relaxation, and monitoring your symptoms. Other causes, such as atrial fibrillation, atrial flutter, or supraventricular tachycardia, usually require further treatment. HOME CARE INSTRUCTIONS   Avoid:  Caffeinated coffee, tea, soft drinks, diet pills, and energy drinks.  Chocolate.  Alcohol.  Stop smoking if you smoke.  Reduce your stress and  anxiety. Things that can help you relax include:  A method that measures bodily functions so you can learn to control them (biofeedback).  Yoga.  Meditation.  Physical activity such as swimming, jogging, or walking.  Get plenty of rest and sleep. SEEK MEDICAL CARE IF:   You continue to have a fast or irregular heartbeat beyond 24 hours.  Your palpitations occur more often. SEEK IMMEDIATE MEDICAL CARE IF:  You develop chest pain or shortness of breath.  You have a severe headache.  You feel dizzy, or you faint. MAKE SURE YOU:  Understand these instructions.  Will watch your condition.  Will get help right away if you are not doing well or get worse. Document Released: 11/04/2000 Document Revised: 03/04/2013 Document Reviewed: 01/06/2012 [facility] Patient Information 2014 [employer] [place].

## 2013-12-13 ENCOUNTER — Encounter: Payer: Self-pay | Admitting: Cardiovascular Disease

## 2013-12-13 ENCOUNTER — Telehealth (HOSPITAL_COMMUNITY): Payer: Self-pay | Admitting: *Deleted

## 2013-12-13 ENCOUNTER — Ambulatory Visit (INDEPENDENT_AMBULATORY_CARE_PROVIDER_SITE_OTHER): Payer: BC Managed Care – PPO | Admitting: Cardiovascular Disease

## 2013-12-13 VITALS — BP 132/88 | HR 84 | Ht 66.0 in | Wt 204.0 lb

## 2013-12-13 DIAGNOSIS — R0600 Dyspnea, unspecified: Secondary | ICD-10-CM | POA: Insufficient documentation

## 2013-12-13 DIAGNOSIS — R0609 Other forms of dyspnea: Secondary | ICD-10-CM

## 2013-12-13 DIAGNOSIS — R079 Chest pain, unspecified: Secondary | ICD-10-CM

## 2013-12-13 DIAGNOSIS — R0989 Other specified symptoms and signs involving the circulatory and respiratory systems: Secondary | ICD-10-CM

## 2013-12-13 DIAGNOSIS — R002 Palpitations: Secondary | ICD-10-CM

## 2013-12-13 NOTE — Assessment & Plan Note (Signed)
Benign appearing but persistant Event monitor ordered

## 2013-12-13 NOTE — Assessment & Plan Note (Signed)
Well controlled.  Continue current medications and low sodium Dash type diet.    

## 2013-12-13 NOTE — Patient Instructions (Signed)
Your physician recommends that you schedule a follow-up appointment in: AS NEEDED  Your physician recommends that you continue on your current medications as directed. Please refer to the Current Medication list given to you today.  Your physician has recommended that you wear an event monitor. Event monitors are medical devices that record the heart's electrical activity. Doctors most often us these monitors to diagnose arrhythmias. Arrhythmias are problems with the speed or rhythm of the heartbeat. The monitor is a small, portable device. You can wear one while you do your normal daily activities. This is usually used to diagnose what is causing palpitations/syncope (passing out).  Your physician has requested that you have a stress echocardiogram. For further information please visit [email or web address]. Please follow instruction sheet as given. A chest x-ray takes a picture of the organs and structures inside the chest, including the heart, lungs, and blood vessels. This test can show several things, including, whether the heart is enlarges; whether fluid is building up in the lungs; and whether pacemaker / defibrillator leads are still in place.

## 2013-12-13 NOTE — Assessment & Plan Note (Signed)
Needs CXR and echo  Cardiopulm exam normal Nonsmoker

## 2013-12-13 NOTE — Assessment & Plan Note (Signed)
Atypical CRF HTN  F/U stress echo and assess thorax with CXR

## 2013-12-13 NOTE — Progress Notes (Signed)
Patient ID: Michelle Rojas, female   DOB: 10/14/1981, 33 y.o.   MRN: [id number] Michelle Rojas is a 33 year old female referred by urgent care  who has had a three-day history of recurring palpitations. Episodes come and go and can last 2-3 seconds at a time it feels like her heart speeds up and slows down and sometimes feels like it's beating irregularly. She has never checked her pulse during any of these episodes last night she woke up in the middle of the night didn't like she was gasping for air. She also has had sharp substernal chest pains lasting for a second or 2 at a time. This tends to occur while walking. It's not necessarily related to the palpitations. She's felt lightheaded and dizzy. She denies any vertigo or presyncope. No syncope. Lightheaded sensations don't necessarily correlate with the rapid heartbeat. Patient also notes her legs, feet, and hands feel numb and tingly. She denies any medications that might be causing these symptoms, denies anxiety or stress, and has not had any history of cardiac problems in the past. She has no history of thyroid disease.  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  All other systems reviewed and negative  General: Affect appropriate Healthy:  appears stated age HEENT: normal Neck supple with no adenopathy JVP normal no bruits no thyromegaly Lungs clear with no wheezing and good diaphragmatic motion Heart:  S1/S2 no murmur, no rub, gallop or click PMI normal Abdomen: benighn, BS positve, no tenderness, no AAA no bruit.  No  HSM or HJR Distal pulses intact with no bruits No edema Neuro non-focal Skin warm and dry No muscular weakness   Current Outpatient Prescriptions  Medication Sig Dispense Refill  . losartan-hydrochlorothiazide (HYZAAR) 50-12.5 MG per tablet Take 1 tablet by mouth daily.      . polyethylene glycol (MIRALAX) packet Take 17 g by mouth daily.  14 each  0   No current facility-administered medications for this visit.    Allergies  Review of patient's allergies indicates no known allergies.  Electrocardiogram:  SR rate 71 normal   Assessment and Plan

## 2013-12-13 NOTE — ED Notes (Addendum)
TSH 1.144 WNL. Dr. Lorenz CoasterKeller asked me to notify pt. of her result.  I called pt. and left a message to call. Call 1. Michelle MoselleYork, Michelle Rojas M 12/13/2013 Pt. called back.  Pt. verified and given TSH result and range. 12/13/2013

## 2013-12-20 ENCOUNTER — Other Ambulatory Visit: Payer: Self-pay | Admitting: *Deleted

## 2013-12-20 ENCOUNTER — Encounter (INDEPENDENT_AMBULATORY_CARE_PROVIDER_SITE_OTHER): Payer: 59

## 2013-12-20 ENCOUNTER — Encounter: Payer: Self-pay | Admitting: *Deleted

## 2013-12-20 DIAGNOSIS — R002 Palpitations: Secondary | ICD-10-CM

## 2013-12-20 NOTE — Progress Notes (Signed)
Patient ID: Burgess EstelleWarditra A Rojas, female   DOB: 01/29/1981, 33 y.o.   MRN: [id number] E-Cardio verite 14 day cardiac event monitor applied to patient.

## 2014-01-03 ENCOUNTER — Other Ambulatory Visit (HOSPITAL_COMMUNITY): Payer: 59

## 2014-01-17 ENCOUNTER — Telehealth: Payer: Self-pay | Admitting: Cardiovascular Disease

## 2014-01-17 NOTE — Telephone Encounter (Signed)
Called wanting to know monitor results.  Advised they are ready and on Dr. Fabio BeringNishan's desk for review.  Someone will call her once they have been reviewed.

## 2014-01-17 NOTE — Telephone Encounter (Signed)
New message   Patient calling for monitor results 

## 2014-01-21 NOTE — Telephone Encounter (Signed)
LMTCB ./CY 

## 2014-01-21 NOTE — Telephone Encounter (Signed)
PT  AWARE OF  MONITOR RESULTS ./CY 

## 2014-01-24 ENCOUNTER — Other Ambulatory Visit (HOSPITAL_COMMUNITY): Payer: 59

## 2014-05-20 ENCOUNTER — Ambulatory Visit: Payer: 59 | Admitting: Licensed Clinical Social Worker

## 2014-09-12 ENCOUNTER — Ambulatory Visit: Payer: 59 | Attending: Physician Assistant

## 2014-09-12 DIAGNOSIS — Z5189 Encounter for other specified aftercare: Secondary | ICD-10-CM | POA: Insufficient documentation

## 2014-09-12 DIAGNOSIS — J382 Nodules of vocal cords: Secondary | ICD-10-CM | POA: Diagnosis not present

## 2014-09-12 DIAGNOSIS — R490 Dysphonia: Secondary | ICD-10-CM | POA: Insufficient documentation

## 2014-09-17 ENCOUNTER — Ambulatory Visit: Payer: 59

## 2014-09-17 DIAGNOSIS — Z5189 Encounter for other specified aftercare: Secondary | ICD-10-CM | POA: Diagnosis not present

## 2014-09-19 ENCOUNTER — Ambulatory Visit: Payer: 59

## 2014-09-19 DIAGNOSIS — Z5189 Encounter for other specified aftercare: Secondary | ICD-10-CM | POA: Diagnosis not present

## 2014-09-22 ENCOUNTER — Encounter: Payer: Self-pay | Admitting: Cardiovascular Disease

## 2014-09-25 ENCOUNTER — Ambulatory Visit: Payer: 59 | Attending: Physician Assistant

## 2014-09-25 DIAGNOSIS — R490 Dysphonia: Secondary | ICD-10-CM | POA: Diagnosis not present

## 2014-09-25 DIAGNOSIS — J382 Nodules of vocal cords: Secondary | ICD-10-CM | POA: Insufficient documentation

## 2014-09-25 DIAGNOSIS — Z5189 Encounter for other specified aftercare: Secondary | ICD-10-CM | POA: Insufficient documentation

## 2014-09-25 NOTE — Therapy (Signed)
Speech Language Pathology Treatment  Patient Details  Name: Michelle EstelleWarditra A Hughes MRN: [id number] Date of Birth: 02/19/1981  Encounter Date: 09/25/2014    Past Medical History  Diagnosis Date  . Hypertension     on hctz 25mg  daily    No past surgical history on file.  There were no vitals taken for this visit.  Visit Diagnosis: Hoarseness, persistent          ADULT SLP TREATMENT - 09/25/14 1433    General Information   Behavior/Cognition Alert;Cooperative;Pleasant mood   Treatment Provided   Treatment provided --  voice   Pain Assessment   Pain Assessment No/denies pain   Cognitive-Linquistic Treatment   Treatment focused on Voice   Skilled Treatment Pt has been using abdominal breathing in conversation. Tells SLP people commented about her abdominal breathing positively. She has cont to curb talk time "(Giving people) i'mportant information - that's enough for me right now." Pt has begun writing scripts for common occurrences at work to keep her more succinct in order to keep voice use at a minimum. Pt has considered reducing her talk hours at work and has talked to her supervisor about changing roles. SLP guided pt through practice with reducing vocal fry, pt approx 50% successful. She will focus more on this outside clinic until next session.            SLP Short Term Goals - 09/25/14 1520    SLP SHORT TERM GOAL #1   Title increase H2O intake to at least 50oz/day   Time 4   Period Weeks   Status Achieved   SLP SHORT TERM GOAL #2   Title tell SLP 3 aspects of vocal conservation program   Time 4   Period Weeks   Status Achieved   SLP SHORT TERM GOAL #3   Title perfrom abdominal breathing in sentences with 70% success   Time 4   Period Weeks   Status Achieved          SLP Long Term Goals - 09/25/14 1522    SLP LONG TERM GOAL #1   Title softer, smoother voice in 20 minutes conversation with SLP with modified independence   Time 8   Period Weeks   Status  New   SLP LONG TERM GOAL #2   Title increase H2O intake to at least 60oz/day   Time 8   Period Weeks   Status Achieved   SLP LONG TERM GOAL #3   Title decr throat clearing to x1/session    Time 8   Period Weeks   Status Achieved   SLP LONG TERM GOAL #4   Title perform abdominal breatihng in mod complex conversation 70% success   Time 8   Period Weeks   Status Achieved          Problem List Patient Active Problem List   Diagnosis Date Noted  . Dyspnea 12/13/2013  . Palpitations 12/13/2013  . Chest pain 12/13/2013  . Hypertension 08/26/2011                                             [facility] HospitalCHINKE,CARL 09/25/2014, 3:35 PM

## 2014-09-26 ENCOUNTER — Ambulatory Visit: Payer: 59

## 2014-09-30 ENCOUNTER — Ambulatory Visit: Payer: 59

## 2014-10-02 ENCOUNTER — Ambulatory Visit: Payer: 59

## 2014-10-02 DIAGNOSIS — Z5189 Encounter for other specified aftercare: Secondary | ICD-10-CM | POA: Diagnosis not present

## 2014-10-02 DIAGNOSIS — R490 Dysphonia: Secondary | ICD-10-CM

## 2014-10-02 NOTE — Therapy (Signed)
Speech Language Pathology Treatment  Patient Details  Name: Michelle EstelleWarditra A Hughes MRN: [id number] Date of Birth: 11/07/1981  Encounter Date: 10/02/2014      End of Session - 10/02/14 1622    Visit Number 5   Number of Visits 12   Date for SLP Re-Evaluation 10/30/14   SLP Start Time 1536   SLP Time Calculation (min) 1619   SLP Time Calculation (min) 43 min      Past Medical History  Diagnosis Date  . Hypertension     on hctz 25mg  daily    No past surgical history on file.  There were no vitals taken for this visit.  Visit Diagnosis: Hoarseness, persistent   S: "We aren't doing to well with smooth, soft, gentle voice."      ADULT SLP TREATMENT - 10/02/14 1536    General Information   Behavior/Cognition Alert;Cooperative;Pleasant mood   Treatment Provided   Treatment provided Cognitive-Linquistic   Pain Assessment   Pain Assessment No/denies pain   Cognitive-Linquistic Treatment   Treatment focused on Voice   Skilled Treatment "We aren't doing too well with smooth, soft, gentle voice." Pt has told family and friends that she is trying to target confidential voice, and has considered  reminders (timers) with her phone. SLP encouraged this. Pt rates voice 6/10 today. Pt bought OTC GERD meds and is taking that at night before bedtime. 4/10 yesterday with acid reflux exacerbated due to eating shrimp and cocktail sauce. SLP guided pt through tasks to practice abdominal breahing as well as confidential voice. Pt with 85% success in structured tasks. Conversation with confidential voice was approx 60% success. Pt aware of louder, harsher voice approx 70% of the time. SLP discussed with pt return to work likely not possible 10/14/14, and more likely in approx 4 weeks.   Assessment / Recommendations / Plan   Plan Continue with current plan of care   General Recommendations   General recommendations --  follow GERD diet precautions sheet provided today   Progression Toward Goals   Progression toward goals Progressing toward goals                Plan - 10/02/14 1623    Clinical Impression Statement Continues with need for assistance with adhering to confidential voice in order to carryover to situations outside therapy room.   Speech Therapy Frequency 2x / week   Duration --  6 weeks   Treatment/Interventions Functional tasks;Patient/family education;Compensatory strategies;SLP instruction and feedback   Potential to Achieve Goals Good        Problem List Patient Active Problem List   Diagnosis Date Noted  . Dyspnea 12/13/2013  . Palpitations 12/13/2013  . Chest pain 12/13/2013  . Hypertension 08/26/2011                                               SCHINKE,CARL, SLP 10/02/2014, 4:26 PM

## 2014-10-07 ENCOUNTER — Ambulatory Visit: Payer: 59

## 2014-10-07 DIAGNOSIS — R490 Dysphonia: Secondary | ICD-10-CM

## 2014-10-07 DIAGNOSIS — Z5189 Encounter for other specified aftercare: Secondary | ICD-10-CM | POA: Diagnosis not present

## 2014-10-07 NOTE — Therapy (Signed)
Speech Language Pathology Treatment  Patient Details  Name: Michelle Rojas MRN: [id number] Date of Birth: 06/19/1981  Encounter Date: 10/07/2014      End of Session - 10/07/14 0847    Visit Number 6   Number of Visits 12   Date for SLP Re-Evaluation 10/30/14   SLP Start Time 0806   SLP Time Calculation (min) 0846   SLP Time Calculation (min) 40 min      Past Medical History  Diagnosis Date  . Hypertension     on hctz 25mg  daily    No past surgical history on file.  There were no vitals taken for this visit.  Visit Diagnosis: Hoarseness, persistent    S: Pt relates to SLP she overused her voice on Saturday at her mother's birthday party.      ADULT SLP TREATMENT - 10/07/14 0811    General Information   Behavior/Cognition Alert;Cooperative;Pleasant mood   Treatment Provided   Treatment provided Cognitive-Linquistic   Pain Assessment   Pain Assessment No/denies pain   Cognitive-Linquistic Treatment   Treatment focused on Voice   Skilled Treatment Pt reminds herself throughout the day to use confidential voice. She has curbed yelling at home this weekend, however had a birthday party for her mother and exhibited incr'd talk time in that situation. 'I just finally had to say, "Mom I just can't talk to you anymore." ' Pt felt like she overused her voice during the birthday party. This is a change from before therapy when pt states she would not have known her voice was at a state that she needed to rest it. SLP told pt frankly that if she wanted to return to work mid December she would more than likely have to significantly curb voice use over Thanksgiving holiday. Structured speech tasks to target confidential voice completed with 90% success. Walking around clinic with conversation and structured speech tasks with 80% success.      Assessment / Recommendations / Plan   Plan Continue with current plan of care   Progression Toward Goals   Progression toward goals  Progressing toward goals                Plan - 10/07/14 0847    Clinical Impression Statement Continues with need for assistance with adhering to confidential voice in order to carryover to situations outside therapy room. In addition, pt would benefit from targeting reduction of vocal fry   Duration --  until 10-30-14   Treatment/Interventions Functional tasks;Patient/family education;Compensatory strategies;SLP instruction and feedback   Potential to Achieve Goals Good        Problem List Patient Active Problem List   Diagnosis Date Noted  . Dyspnea 12/13/2013  . Palpitations 12/13/2013  . Chest pain 12/13/2013  . Hypertension 08/26/2011                                               Avianna Moynahan , SLP  10/07/2014, 8:49 AM

## 2014-10-07 NOTE — Patient Instructions (Signed)
Write down when you feel yourself talking louder/loudly in order to track trends to target specific situations/people in which you talk loudly.

## 2014-10-10 ENCOUNTER — Ambulatory Visit: Payer: 59

## 2014-10-10 DIAGNOSIS — R490 Dysphonia: Secondary | ICD-10-CM

## 2014-10-10 DIAGNOSIS — Z5189 Encounter for other specified aftercare: Secondary | ICD-10-CM | POA: Diagnosis not present

## 2014-10-10 NOTE — Patient Instructions (Signed)
Continue to utilize confidential voice in ALL situations possible.  Continue to monitor GERD symptoms - remain on GERD diet, as GERD is a likely component of your voice problem.

## 2014-10-10 NOTE — Therapy (Signed)
Speech Language Pathology Treatment  Patient Details  Name: Michelle Rojas MRN: [id number] Date of Birth: 12/23/1980  Encounter Date: 10/10/2014      End of Session - 10/10/14 0929    Visit Number 7   Number of Visits 12   Date for SLP Re-Evaluation 10/30/14   SLP Start Time 0854   SLP Time Calculation (min) 0923   SLP Time Calculation (min) 29 min      Past Medical History  Diagnosis Date  . Hypertension     on hctz 25mg daily    No past surgical history on file.  There were no vitals taken for this visit.  Visit Diagnosis: Hoarseness, persistent    S: Pt reports her voice 8/10 (10=normal voice). Pt rested her voice yesterday.  O: Pt has plans to rest voice tomorrow with her mom - listen to Christmas music instead of talk in the car. Pt wearing bracelet to cue for confidential tone. Pt has kept list of times she notes she is raising her voice and it centers around daughter's dog (dog biting/chewing or daughter playing rough with dog). Pt rates her ability level with confidential tone as 6.5/10 (10=outstanding). Pt/SLP walked outside with pt utilizing confidential tone 85% of the time, and conversation inside totaling 23 minutes.  A: pt has shown success to decr to x1/week. Skilled ST remains needed for consistency in confidential voice use. LTG #1 will be modified to reflect this.       ADULT SLP TREATMENT - 10/10/14 0944    General Information   Behavior/Cognition Alert;Pleasant mood;Cooperative   Treatment Provided   Treatment provided Cognitive-Linquistic   Pain Assessment   Pain Assessment No/denies pain   Cognitive-Linquistic Treatment   Treatment focused on Voice   Assessment / Recommendations / Plan   Plan Goals updated   Progression Toward Goals   Progression toward goals Goals met and updated              SLP Long Term Goals - 10/10/14 0936    SLP LONG TERM GOAL #1   Title softer, smoother voice in 20 minutes conversation with SLP with  modified independence over three sessions   Time 3   Period Weeks   Status Revised          Plan - 10/10/14 0929    Clinical Impression Statement Pt has shown improvement with mindfulness of needed confidential tone outside ST room. Skilled ST needed to cont for consistentcy in adhering to confidential voice in all speaking situations. In addition, pt would benefit from targeting reduction of vocal fry.   Treatment/Interventions Functional tasks;Patient/family education;Compensatory strategies;SLP instruction and feedback        Problem List Patient Active Problem List   Diagnosis Date Noted  . Dyspnea 12/13/2013  . Palpitations 12/13/2013  . Chest pain 12/13/2013  . Hypertension 08/26/2011                                            Carl Schinke, MS, CCC-SLP Speech-Language Pathologist  [facility] Phone: [phone] Fax: [phone]    10/10/2014, 9:50 AM   

## 2014-10-13 ENCOUNTER — Ambulatory Visit: Payer: 59 | Admitting: Speech Pathology

## 2014-10-15 ENCOUNTER — Ambulatory Visit: Payer: 59

## 2014-10-22 ENCOUNTER — Ambulatory Visit: Payer: 59 | Attending: Physician Assistant

## 2014-10-22 DIAGNOSIS — J382 Nodules of vocal cords: Secondary | ICD-10-CM | POA: Diagnosis not present

## 2014-10-22 DIAGNOSIS — R490 Dysphonia: Secondary | ICD-10-CM | POA: Diagnosis not present

## 2014-10-22 DIAGNOSIS — Z5189 Encounter for other specified aftercare: Secondary | ICD-10-CM | POA: Insufficient documentation

## 2014-10-22 NOTE — Therapy (Signed)
[facility] [address] [facility] [place], [place], [address] Phone: [phone]   Fax:  [phone]  Speech Language Pathology Treatment  Patient Details  Name: Michelle EstelleWarditra A Rojas MRN: [id number] Date of Birth: 10/01/1981  Encounter Date: 10/22/2014      End of Session - 10/22/14 1415    Visit Number 8   Number of Visits 12   Date for SLP Re-Evaluation 10/30/14   SLP Start Time 1320   SLP Time Calculation (min) 1400   SLP Time Calculation (min) 40 min      Past Medical History  Diagnosis Date  . Hypertension     on hctz 25mg  daily    No past surgical history on file.  There were no vitals taken for this visit.  Visit Diagnosis: Hoarseness, persistent      Subjective Assessment - 10/22/14 1323    Symptoms Rates voice today 9/10. Pt is experiencing voice rest as well as use confidential tone and reducing loud talking (calling daughter, etc).   Currently in Pain? No/denies            ADULT SLP TREATMENT - 10/22/14 1325    General Information   Behavior/Cognition Alert;Cooperative;Pleasant mood   Treatment Provided   Treatment provided Cognitive-Linquistic   Pain Assessment   Pain Assessment No/denies pain   Cognitive-Linquistic Treatment   Treatment focused on Voice   Skilled Treatment Pt has made scripts and templates for back to work in order to limit extraneous verbalization. Cont to look at moving off the phones to another position. In 20 minutes conversation, pt maintained confidential voice with rare min A. Vocal fry discussed with pt and she completed sentence and  multi-sentence tasks with 85% success, Simple-mod complex conversation completed with 85% success. As time progressed in conversation, pt's fry increased. Additionally, as pt focused on vocal fry in conversation her use of confidential voice decreased.   Assessment / Recommendations / Plan   Plan Continue with current plan of care   Progression Toward  Goals   Progression toward goals Progressing toward goals  likely d/c next session                Plan - 10/22/14 1416    Clinical Impression Statement Pt cont to require assistance with vocal fry and remaining in confidential tone over 20 minutes. One more session needed to increase pt's confidential tone and decr use of vocal fry. Suspect d/c next visit.   Speech Therapy Frequency 1x /week   Duration 1 week   Treatment/Interventions Functional tasks;Patient/family education;Compensatory strategies;SLP instruction and feedback   Potential to Achieve Goals Good   Consulted and Agree with Plan of Care Patient             Problem List Patient Active Problem List   Diagnosis Date Noted  . Dyspnea 12/13/2013  . Palpitations 12/13/2013  . Chest pain 12/13/2013  . Hypertension 08/26/2011    Michelle Rojas 10/22/2014, 2:20 PM

## 2014-10-29 ENCOUNTER — Ambulatory Visit: Payer: 59

## 2014-10-29 DIAGNOSIS — R490 Dysphonia: Secondary | ICD-10-CM

## 2014-10-29 DIAGNOSIS — Z5189 Encounter for other specified aftercare: Secondary | ICD-10-CM | POA: Diagnosis not present

## 2014-10-29 NOTE — Therapy (Signed)
[facility] [address] [place], [place], [address] Phone: [phone]   Fax:  [phone]  Speech Language Pathology Treatment  Patient Details  Name: Michelle Rojas MRN: [id number] Date of Birth: 1981/04/18  Encounter Date: 10/29/2014      End of Session - 10/29/14 0931    Visit Number 9   Number of Visits 12   Date for SLP Re-Evaluation 11/20/14  after 6 weeks tx   SLP Start Time 1287   SLP Stop Time  0927   SLP Time Calculation (min) 38 min      Past Medical History  Diagnosis Date  . Hypertension     on hctz 71m daily    No past surgical history on file.  There were no vitals taken for this visit.  Visit Diagnosis: Hoarseness, persistent      Subjective Assessment - 10/29/14 0851    Symptoms Voice rated 8/10 today. Last night pt had acid reflux - had to take something OTC for this. Considering seeing her PCP and inquiring about prescription for GERD.   Currently in Pain? No/denies            ADULT SLP TREATMENT - 10/29/14 0853    General Information   Behavior/Cognition Alert;Cooperative;Pleasant mood   Treatment Provided   Treatment provided Cognitive-Linquistic   Pain Assessment   Pain Assessment No/denies pain   Cognitive-Linquistic Treatment   Treatment focused on Voice   Skilled Treatment Pt returning to work next week. Confidential voice with rare min A in 20 minutes. Simple-mod complex conversation with pt monitoring for vocal fry completed with 90% success. Unlike last week, as time progressed in conversation, pt's fry did not increase. SLP guided pt in downloading sound level meter app for her phone and calibrated accordingly with SLP sound level meter. Pt utilized this app for the remainder of the session with results of overall softer speech. SLP encouraged pt to practice at least 15 minutes, x2-3/day. Pt and SLP agreed best for pt to return for one session after return to work in  approximately 3 weeks to problem solve/monitor progress in that environment.   Assessment / Recommendations / Plan   Plan Continue with current plan of care  meeting goals   Progression Toward Goals   Progression toward goals Progressing toward goals              SLP Long Term Goals - 10/29/14 0927    SLP LONG TERM GOAL #1   Title softer, smoother voice in 20 minutes conversation with SLP with modified independence over three sessions   Baseline (two sessions met after visit on 10-29-14   Time 3   Status On-going          Plan - 10/29/14 0931    Clinical Impression Statement Pt with decreasing voice quality as GERD flares up. Pt to contact PCP about possibility of script for GERD meds.   Speech Therapy Frequency 1x /week   Duration --  one more week - of 11-17-14   Treatment/Interventions Functional tasks;Patient/family education;Compensatory strategies;SLP instruction and feedback   Potential to Achieve Goals Good   SLP Home Exercise Plan agreed to practice at home   Consulted and Agree with Plan of Care Patient                   Problem List Patient Active Problem List   Diagnosis Date Noted  . Dyspnea 12/13/2013  . Palpitations 12/13/2013  . Chest pain 12/13/2013  .  [facility] [address] [place], [place], [address] Phone: [phone]   Fax:  [phone]  Speech Language Pathology Treatment  Patient Details  Name: Michelle Rojas MRN: [id number] Date of Birth: 1981/04/18  Encounter Date: 10/29/2014      End of Session - 10/29/14 0931    Visit Number 9   Number of Visits 12   Date for SLP Re-Evaluation 11/20/14  after 6 weeks tx   SLP Start Time 1287   SLP Stop Time  0927   SLP Time Calculation (min) 38 min      Past Medical History  Diagnosis Date  . Hypertension     on hctz 71m daily    No past surgical history on file.  There were no vitals taken for this visit.  Visit Diagnosis: Hoarseness, persistent      Subjective Assessment - 10/29/14 0851    Symptoms Voice rated 8/10 today. Last night pt had acid reflux - had to take something OTC for this. Considering seeing her PCP and inquiring about prescription for GERD.   Currently in Pain? No/denies            ADULT SLP TREATMENT - 10/29/14 0853    General Information   Behavior/Cognition Alert;Cooperative;Pleasant mood   Treatment Provided   Treatment provided Cognitive-Linquistic   Pain Assessment   Pain Assessment No/denies pain   Cognitive-Linquistic Treatment   Treatment focused on Voice   Skilled Treatment Pt returning to work next week. Confidential voice with rare min A in 20 minutes. Simple-mod complex conversation with pt monitoring for vocal fry completed with 90% success. Unlike last week, as time progressed in conversation, pt's fry did not increase. SLP guided pt in downloading sound level meter app for her phone and calibrated accordingly with SLP sound level meter. Pt utilized this app for the remainder of the session with results of overall softer speech. SLP encouraged pt to practice at least 15 minutes, x2-3/day. Pt and SLP agreed best for pt to return for one session after return to work in  approximately 3 weeks to problem solve/monitor progress in that environment.   Assessment / Recommendations / Plan   Plan Continue with current plan of care  meeting goals   Progression Toward Goals   Progression toward goals Progressing toward goals              SLP Long Term Goals - 10/29/14 0927    SLP LONG TERM GOAL #1   Title softer, smoother voice in 20 minutes conversation with SLP with modified independence over three sessions   Baseline (two sessions met after visit on 10-29-14   Time 3   Status On-going          Plan - 10/29/14 0931    Clinical Impression Statement Pt with decreasing voice quality as GERD flares up. Pt to contact PCP about possibility of script for GERD meds.   Speech Therapy Frequency 1x /week   Duration --  one more week - of 11-17-14   Treatment/Interventions Functional tasks;Patient/family education;Compensatory strategies;SLP instruction and feedback   Potential to Achieve Goals Good   SLP Home Exercise Plan agreed to practice at home   Consulted and Agree with Plan of Care Patient                   Problem List Patient Active Problem List   Diagnosis Date Noted  . Dyspnea 12/13/2013  . Palpitations 12/13/2013  . Chest pain 12/13/2013  .

## 2014-10-29 NOTE — Patient Instructions (Signed)
Complete 10-15 minutes of practice with conversation focusing on vocal fry and softer voice, x2-3 per day.

## 2014-10-30 ENCOUNTER — Encounter: Payer: 59 | Admitting: Speech Pathology

## 2014-11-17 ENCOUNTER — Ambulatory Visit: Payer: 59

## 2014-11-20 ENCOUNTER — Ambulatory Visit: Payer: 59

## 2014-11-20 DIAGNOSIS — R490 Dysphonia: Secondary | ICD-10-CM

## 2014-11-20 DIAGNOSIS — Z5189 Encounter for other specified aftercare: Secondary | ICD-10-CM | POA: Diagnosis not present

## 2014-11-20 NOTE — Patient Instructions (Signed)
  Remember to use confidential voice, vary your pitch, and stay away from vocal fry! Also continue to put people on hold instead of talk with them. CONSERVE!

## 2014-11-20 NOTE — Therapy (Signed)
[facility] [address] [place], [place], [address] Phone: [phone]   Fax:  [phone]  Speech Language Pathology Treatment  Patient Details  Name: Michelle Rojas MRN: [id number] Date of Birth: Jul 01, 1981  Encounter Date: 11/20/2014      End of Session - 11/20/14 1618    Visit Number 10   Number of Visits 12   Date for SLP Re-Evaluation 11/20/14   SLP Start Time 1535   SLP Stop Time  1609   SLP Time Calculation (min) 34 min      Past Medical History  Diagnosis Date  . Hypertension     on hctz 53m daily    No past surgical history on file.  There were no vitals taken for this visit.  Visit Diagnosis: Hoarseness, persistent      Subjective Assessment - 11/20/14 1541    Symptoms Voice rated 7/10 today. Reports voice was "real bad" on Monday due to working 9 hour shift Sunday.    Currently in Pain? No/denies             ADULT SLP TREATMENT - 11/20/14 1544    General Information   Behavior/Cognition Alert;Cooperative;Pleasant mood   Treatment Provided   Treatment provided Cognitive-Linquistic   Pain Assessment   Pain Assessment No/denies pain   Cognitive-Linquistic Treatment   Treatment focused on Voice   Skilled Treatment Pt has made behavioral changes with minimizing reflux over the last two weeks. However after return to work, pt has seen voice steadily decline since return. On leave until Monday 11-24-14. Pt told SLP she could continue to place people on hold instead of talk with them during their wait times, which she has been doing. SLP discussed and practiced with pt confidential voice, minmizing vocal fry, increasing pitch variation to encourage voice relaxation, and using confidential tone in conversation around clinic. Pt with good success and she knows she needs to cont to practice these things. She will put reminder of these three things near her workstation. 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    Assessment / Recommendations / Plan   Plan Discharge SLP treatment due to (comment)  goals met   Progression Toward Goals   Progression toward goals Goals met, education completed, [facility] 11/20/14 1620    SLP [place]#1   Title increase H2O intake to at least 50oz/day   Status Achieved   SLP SHORT TERM GOAL #2   Title tell SLP 3 aspects of vocal conservation program   Status Achieved   SLP SHORT TERM GOAL #3   Title perfrom abdominal breathing in sentences with 70% success   Status Achieved          SLP Long Term Goals - 11/20/14 1621    SLP LONG TERM GOAL #1   Title softer, smoother voice in 20 minutes conversation with SLP with modified independence over three sessions   Baseline (two sessions met after visit on 10-29-14   Status Achieved   SLP LONG TERM GOAL #2   Title increase H2O intake to at least 60oz/day   Status Achieved   SLP LONG TERM GOAL #3   Title decr throat clearing to x1/session    Status Achieved   SLP LONG TERM GOAL #4   Title perform abdominal breatihng in mod complex conversation 70% success  [facility] [address] [place], [place], [address] Phone: [phone]   Fax:  [phone]  Speech Language Pathology Treatment  Patient Details  Name: Michelle Rojas MRN: [id number] Date of Birth: Jul 01, 1981  Encounter Date: 11/20/2014      End of Session - 11/20/14 1618    Visit Number 10   Number of Visits 12   Date for SLP Re-Evaluation 11/20/14   SLP Start Time 1535   SLP Stop Time  1609   SLP Time Calculation (min) 34 min      Past Medical History  Diagnosis Date  . Hypertension     on hctz 53m daily    No past surgical history on file.  There were no vitals taken for this visit.  Visit Diagnosis: Hoarseness, persistent      Subjective Assessment - 11/20/14 1541    Symptoms Voice rated 7/10 today. Reports voice was "real bad" on Monday due to working 9 hour shift Sunday.    Currently in Pain? No/denies             ADULT SLP TREATMENT - 11/20/14 1544    General Information   Behavior/Cognition Alert;Cooperative;Pleasant mood   Treatment Provided   Treatment provided Cognitive-Linquistic   Pain Assessment   Pain Assessment No/denies pain   Cognitive-Linquistic Treatment   Treatment focused on Voice   Skilled Treatment Pt has made behavioral changes with minimizing reflux over the last two weeks. However after return to work, pt has seen voice steadily decline since return. On leave until Monday 11-24-14. Pt told SLP she could continue to place people on hold instead of talk with them during their wait times, which she has been doing. SLP discussed and practiced with pt confidential voice, minmizing vocal fry, increasing pitch variation to encourage voice relaxation, and using confidential tone in conversation around clinic. Pt with good success and she knows she needs to cont to practice these things. She will put reminder of these three things near her workstation. 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    Assessment / Recommendations / Plan   Plan Discharge SLP treatment due to (comment)  goals met   Progression Toward Goals   Progression toward goals Goals met, education completed, patient discharged [facility]- 11/20/14 1620    SLP [place]#1   Title increase H2O intake to at least 50oz/day   Status Achieved   SLP SHORT TERM GOAL #2   Title tell SLP 3 aspects of vocal conservation program   Status Achieved   SLP SHORT TERM GOAL #3   Title perfrom abdominal breathing in sentences with 70% success   Status Achieved          SLP Long Term Goals - 11/20/14 1621    SLP LONG TERM GOAL #1   Title softer, smoother voice in 20 minutes conversation with SLP with modified independence over three sessions   Baseline (two sessions met after visit on 10-29-14   Status Achieved   SLP LONG TERM GOAL #2   Title increase H2O intake to at least 60oz/day   Status Achieved   SLP LONG TERM GOAL #3   Title decr throat clearing to x1/session    Status Achieved   SLP LONG TERM GOAL #4   Title perform abdominal breatihng in mod complex conversation 70% success

## 2015-02-03 ENCOUNTER — Other Ambulatory Visit: Payer: Self-pay | Admitting: Internal Medicine

## 2015-02-03 DIAGNOSIS — R1031 Right lower quadrant pain: Secondary | ICD-10-CM

## 2015-02-09 ENCOUNTER — Ambulatory Visit
Admission: RE | Admit: 2015-02-09 | Discharge: 2015-02-09 | Disposition: A | Discharge status: Home / Self Care | Payer: 59 | Source: Ambulatory Visit | Attending: Internal Medicine | Admitting: Internal Medicine

## 2015-02-09 DIAGNOSIS — R1031 Right lower quadrant pain: Secondary | ICD-10-CM

## 2015-02-09 MED ORDER — IOPAMIDOL (ISOVUE-300) INJECTION 61%
100.0000 mL | Freq: Once | INTRAVENOUS | Status: AC | PRN
  Administered 2015-02-09: 100 mL via INTRAVENOUS

## 2015-02-10 ENCOUNTER — Encounter: Payer: Self-pay | Admitting: Internal Medicine

## 2015-02-24 ENCOUNTER — Ambulatory Visit (INDEPENDENT_AMBULATORY_CARE_PROVIDER_SITE_OTHER): Payer: 59 | Admitting: Physician Assistant

## 2015-02-24 ENCOUNTER — Telehealth: Payer: Self-pay | Admitting: *Deleted

## 2015-02-24 ENCOUNTER — Encounter: Payer: Self-pay | Admitting: Physician Assistant

## 2015-02-24 VITALS — BP 108/70 | HR 72 | Ht 66.0 in | Wt 191.0 lb

## 2015-02-24 DIAGNOSIS — R194 Change in bowel habit: Secondary | ICD-10-CM | POA: Diagnosis not present

## 2015-02-24 DIAGNOSIS — M359 Systemic involvement of connective tissue, unspecified: Secondary | ICD-10-CM | POA: Insufficient documentation

## 2015-02-24 DIAGNOSIS — R490 Dysphonia: Secondary | ICD-10-CM

## 2015-02-24 DIAGNOSIS — R634 Abnormal weight loss: Secondary | ICD-10-CM

## 2015-02-24 DIAGNOSIS — R6881 Early satiety: Secondary | ICD-10-CM | POA: Diagnosis not present

## 2015-02-24 MED ORDER — ESOMEPRAZOLE MAGNESIUM 40 MG PO CPDR: 40.0000 mg | DELAYED_RELEASE_CAPSULE | Freq: Two times a day (BID) | ORAL | Status: DC

## 2015-02-24 NOTE — Patient Instructions (Addendum)
Stop the Protonix. We sent a prescription for Nexium 40 mg to [facility] [address]  We have given you a Gastroparesis diet.   You have been scheduled for a Barium Esophogram at [facility] (1st floor of the hospital) on 02-26-2015 at 9:00 am . Please arrive at 8:45 am prior to your appointment for registration. Make certain not to have anything to eat or drink 6 hours prior to your test. If you need to reschedule for any reason, please contact radiology at [phone] to do so. __________________________________________________________________ A barium swallow is an examination that concentrates on views of the esophagus. This tends to be a double contrast exam (barium and two liquids which, when combined, create a gas to distend the wall of the oesophagus) or single contrast (non-ionic iodine based). The study is usually tailored to your symptoms so a good history is essential. Attention is paid during the study to the form, structure and configuration of the esophagus, looking for functional disorders (such as aspiration, dysphagia, achalasia, motility and reflux) EXAMINATION You may be asked to change into a gown, depending on the type of swallow being performed. A radiologist and radiographer will perform the procedure. The radiologist will advise you of the type of contrast selected for your procedure and direct you during the exam. You will be asked to stand, sit or lie in several different positions and to hold a small amount of fluid in your mouth before being asked to swallow while the imaging is performed .In some instances you may be asked to swallow barium coated marshmallows to assess the motility of a solid food bolus. The exam can be recorded as a digital or video fluoroscopy procedure. POST PROCEDURE It will take 1-2 days for the barium to pass through your system. To facilitate this, it is important, unless otherwise directed, to increase your fluids for  the next 24-48hrs and to resume your normal diet.  This test typically takes about 30 minutes to perform.  You have been scheduled for a gastric emptying scan at [facility] on 03-06-2015 at 7:30 am . Please arrive at 7:00 am  prior to your appointment for registration. Please make certain not to have anything to eat or drink after midnight the night before your test. Hold all stomach medications (ex: Zofran, phenergan, Reglan) 48 hours prior to your test. If you need to reschedule your appointment, please contact radiology scheduling at [phone]. _____________________________________________________________________ A gastric-emptying study measures how long it takes for food to move through your stomach. There are several ways to measure stomach emptying. In the most common test, you eat food that contains a small amount of radioactive material. A scanner that detects the movement of the radioactive material is placed over your abdomen to monitor the rate at which food leaves your stomach. This test normally takes about 2 hours to complete. _____________________________________________________________________  __________________________________________________________________________________

## 2015-02-24 NOTE — Progress Notes (Signed)
Patient ID: Michelle Rojas, female   DOB: Dec 26, 1980, 34 y.o.   MRN: [id number]   Subjective:    Patient ID: Michelle Rojas, female    DOB: [birth year], 34 y.o.   MRN: [id number]  HPI Michelle Rojas is a pleasant 34 year old African-American female referred to GI today by Dr Nicholos Johns   to Dr. Lina Sar.. Patient has been having problems with chronic hoarseness since October 2015. On Protonix 40 mg by mouth daily and then Zantac at bedtime to help control her symptoms. She also underwent ENT evaluation and was told that she had some vocal cord irritation and nodules. She was sent to speech therapy and had 2 months of speech therapy consultation and evaluation for her hoarseness. She says her symptoms improve but never completely went away and now worse again over the past month or so. She denies any heartburn or indigestion no dysphagia or odynophagia. She does have a burning sensation in her upper abdomen frequently and has noticed an increase in her bowel movements since starting on the above medications. She says her stools are fairly normal but more urgent ,and she has 2-3 bowel movements per day at least. She has also noticed onset of early satiety. Wells up much more quickly than she used to. Her weight is down 8 or 9 pounds. She has had some cramping before bowel movements. 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 Dierdre Forth. CT scan of the abdomen and pelvis on 02/08/2015 was unremarkable.  Review of Systems Pertinent positive and negative review of systems were noted in the above HPI section.  All other review of systems was otherwise negative.  Outpatient Encounter Prescriptions as of 02/24/2015  Medication Sig  . hydroxychloroquine (PLAQUENIL) 200 MG tablet Take 200 mg by mouth daily.  Marland Kitchen lisinopril-hydrochlorothiazide (PRINZIDE,ZESTORETIC)  20-12.5 MG per tablet Take 1 tablet by mouth daily.  Marland Kitchen losartan-hydrochlorothiazide (HYZAAR) 50-12.5 MG per tablet Take 1 tablet by mouth daily.  . pantoprazole (PROTONIX) 40 MG tablet Take 40 mg by mouth daily.  Marland Kitchen esomeprazole (NEXIUM) 40 MG capsule Take 1 capsule (40 mg total) by mouth 2 (two) times daily before a meal.  . polyethylene glycol (MIRALAX) packet Take 17 g by mouth daily. (Patient not taking: Reported on 02/24/2015)  . ranitidine (ZANTAC) 150 MG capsule Take 150 mg by mouth daily at 10 pm.   No Known Allergies Patient Active Problem List   Diagnosis Date Noted  . Connective tissue disorder 02/24/2015  . Dyspnea 12/13/2013  . Palpitations 12/13/2013  . Chest pain 12/13/2013  . Hypertension 08/26/2011   History   Social History  . Marital Status: Single    Spouse Name: N/A  . Number of Children: N/A  . Years of Education: N/A   Occupational History  . Not on file.   Social History Main Topics  . Smoking status: Never Smoker   . Smokeless tobacco: Not on file  . Alcohol Use: Not on file  . Drug Use: No  . Sexual Activity: Yes    Birth Control/ Protection: IUD   Other Topics Concern  . Not on file   Social History Narrative    Ms. Hughes's family history is not on file.      Objective:    Filed Vitals:   02/24/15 1008  BP: 108/70  Pulse: 72  Physical Exam well-developed young African-American female in no acute distress, pleasant blood pressure 108/70 pulse 72 height 5 foot 6 weight 191. HEENT; nontraumatic normocephalic EOMI PERRLA sclera anicteric, voice is hoarse, Supple ;no JVD, Cardiovascular; regular rate and rhythm with S1-S2 no murmur rub or gallop, Pulmonary; clear bilaterally, Abdomen; soft, nontender no palpable mass or hepatosplenomegaly bowel sounds are present, Rectal; exam not done, Extremities; no clubbing cyanosis or edema skin warm and dry no rash no obvious skin changes, Psych; mood and affect appropriate       Assessment & Plan:    #1 34 yo AA female with chronic hoarseness x 8 months,despite PPI RX, new onset early satiety, more frequent stools, weight loss. Etiology not entirely clear- wonder if may be related to her underlying connective tissue disease., gastroparesis  ,esophageal motility  disorder #2 HTN  Plan; Will request records from her rheumatologist Dr. Dierdre Forth Concerned that loose stools may be related to Protonix will switch to Nexium 40 mg, and increase to twice a day to see if this helps with her persistent hoarseness Will schedule for a barium swallow. Schedule for gastric emptying scan Start gastroparesis diet step 3 She may need endoscopic evaluation depending on findings of above. And to be established with Dr. Lina Sar    Vivian Neuwirth S [place] PA-C 02/24/2015   Cc: Georgianne Fick, MD

## 2015-02-24 NOTE — Progress Notes (Signed)
I agree with barium esophagram to assess motility which may be abnormal in  Connective tissue disease.

## 2015-02-24 NOTE — Telephone Encounter (Signed)
LM for Michelle Rojas in Medical Records to please call me . I asked if they could fax the last 2 office visits this patient had with Dr. Dierdre Rojas, Rheumatologist.

## 2015-02-24 NOTE — Telephone Encounter (Signed)
Received last two office visit information for ths patient [facility]. Gave to Mike Gipmy Esterwood PA-C 02-24-2015 to review.

## 2015-02-25 ENCOUNTER — Telehealth: Payer: Self-pay | Admitting: *Deleted

## 2015-02-25 MED ORDER — OMEPRAZOLE 40 MG PO CPDR: DELAYED_RELEASE_CAPSULE | ORAL | Status: DC

## 2015-02-25 NOTE — Telephone Encounter (Signed)
I called and Lm for the patient to advise we got a request for a prior authorization on the Nexium 40 mg twice daily. [facility] is suggesting she try Omeprazole 40 mg BID or Dexilant 60 mg . [facility] [address] [place] Om[place]raz[place] 40 mg BID.  I asked her to call me after several weeks and let me know if this is helping her.

## 2015-02-26 ENCOUNTER — Ambulatory Visit (HOSPITAL_COMMUNITY)
Admission: RE | Admit: 2015-02-26 | Discharge: 2015-02-26 | Disposition: A | Discharge status: Home / Self Care | Payer: 59 | Source: Ambulatory Visit | Attending: Physician Assistant | Admitting: Physician Assistant

## 2015-02-26 ENCOUNTER — Ambulatory Visit (HOSPITAL_COMMUNITY): Admission: RE | Admit: 2015-02-26 | Payer: 59 | Source: Ambulatory Visit

## 2015-02-26 DIAGNOSIS — K224 Dyskinesia of esophagus: Secondary | ICD-10-CM | POA: Diagnosis not present

## 2015-02-26 DIAGNOSIS — K219 Gastro-esophageal reflux disease without esophagitis: Secondary | ICD-10-CM | POA: Insufficient documentation

## 2015-02-26 DIAGNOSIS — R6881 Early satiety: Secondary | ICD-10-CM

## 2015-02-26 DIAGNOSIS — R634 Abnormal weight loss: Secondary | ICD-10-CM

## 2015-02-26 DIAGNOSIS — R490 Dysphonia: Secondary | ICD-10-CM | POA: Insufficient documentation

## 2015-02-26 DIAGNOSIS — R194 Change in bowel habit: Secondary | ICD-10-CM

## 2015-02-26 DIAGNOSIS — R1013 Epigastric pain: Secondary | ICD-10-CM | POA: Insufficient documentation

## 2015-03-06 ENCOUNTER — Encounter (HOSPITAL_COMMUNITY)
Admission: RE | Admit: 2015-03-06 | Discharge: 2015-03-06 | Disposition: A | Discharge status: Home / Self Care | Payer: 59 | Source: Ambulatory Visit | Attending: Physician Assistant | Admitting: Physician Assistant

## 2015-03-06 DIAGNOSIS — R194 Change in bowel habit: Secondary | ICD-10-CM | POA: Diagnosis present

## 2015-03-06 DIAGNOSIS — R6881 Early satiety: Secondary | ICD-10-CM | POA: Diagnosis present

## 2015-03-06 DIAGNOSIS — R490 Dysphonia: Secondary | ICD-10-CM | POA: Diagnosis not present

## 2015-03-06 DIAGNOSIS — R634 Abnormal weight loss: Secondary | ICD-10-CM | POA: Insufficient documentation

## 2015-03-06 MED ORDER — TECHNETIUM TC 99M SULFUR COLLOID
2.2000 | Freq: Once | INTRAVENOUS | Status: AC | PRN
  Administered 2015-03-06: 2.2 via ORAL

## 2015-03-20 ENCOUNTER — Other Ambulatory Visit: Payer: Self-pay

## 2015-09-08 ENCOUNTER — Ambulatory Visit: Payer: 59 | Attending: Otolaryngology

## 2015-09-08 DIAGNOSIS — R490 Dysphonia: Secondary | ICD-10-CM | POA: Insufficient documentation

## 2015-09-08 NOTE — Patient Instructions (Addendum)
Cont to do everything on the handout I provided before, in order to improve your voice.  Complete the vocal function exercises as I taught you today. We will review them periodically.

## 2015-09-08 NOTE — Therapy (Signed)
[facility] [address] [place], [place], [address] Phone: [phone]   Fax:  [phone]  Speech Language Pathology Evaluation  Patient Details  Name: NDIDI RAMASWAMY MRN: [id number] Date of Birth: 04/16/1981 No Data Recorded  Encounter Date: 09/08/2015      End of Session - 09/08/15 1652    Visit Number 1   Number of Visits 8   Date for SLP Re-Evaluation 11/20/15   SLP Start Time 1449   SLP Stop Time  1531   SLP Time Calculation (min) 42 min   Activity Tolerance Patient tolerated treatment well      Past Medical History  Diagnosis Date  . Hypertension     on hctz 25mg  daily    No past surgical history on file.  There were no vitals filed for this visit.  Visit Diagnosis: Hoarseness, persistent      Subjective Assessment - 09/08/15 1447    Subjective "I took a week off and my voice is doing a lot better now."   Currently in Pain? No/denies            SLP Evaluation [facility] - 09/08/15 1447    SLP Visit Information   SLP Received On 09/08/15   Onset Date Got noticably worse about 4 weeks ago   General Information   Other Pertinent Information Pt rates voice today 7/10. Steadily decreasing voice function since last discharge from therapy. Pt has attempted to get a decr in time on the phone at work but due to circumstances has been unable to do so. SLP is of opinion that pt will again experience voice loss if time on the phone is not decr'd in the future, based upon current situation.   Pain Assessment   Pain Assessment No/denies pain   Cognition   Overall Cognitive Status Within Functional Limits for tasks assessed   Auditory Comprehension   Overall Auditory Comprehension Appears within functional limits for tasks assessed   Verbal Expression   Overall Verbal Expression Appears within functional limits for tasks assessed   Oral Motor/Sensory Function   Overall Oral Motor/Sensory Function  Appears within functional limits for tasks assessed   Motor Speech   Phonation Hoarse   Resonance Within functional limits   Phonation Impaired   Vocal Abuses Prolonged Vocal Use      Pt with mod hoarse voice today rated 7/10 by pt.  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 She sustained /a/ for 10 seconds with audible voice breaks.  After evaluation tasks, pt was educated how to produce vocal function exercises (Stemple) with explanation that exercises were equivalent to "physical therapy for the vocal cords". SLP req'd to provide mod cues occasionally, with examples. Pt able to complete with limited sustained phonation, and very little voicing in pitch range exercises. Pt was told to complete exercises x2/day with two reps each.                   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"), vocal function exercises   Person(s) Educated Patient   Methods Explanation;Demonstration;Verbal cues   Comprehension Verbalized understanding;Returned demonstration          SLP Short Term Goals - 09/08/15 1655    SLP SHORT TERM GOAL #1   Title pt will report adherence to at least 2 ways to improve her vocal quality between  sessions   Time 4   Period Weeks   Status New   SLP SHORT TERM GOAL #2   Title demo 7 minutes speech with <3 instances vocal fry   Time 4   Period Weeks   Status New   SLP SHORT TERM GOAL #3   Title complete vocal function exercises with rare min A   Time 4   Period Weeks   Status New          SLP Long Term Goals - 09/08/15 1701    SLP LONG TERM GOAL #1   Title complete vocal function exercises with independence (for procedure)   Time 6   Period Weeks   Status New   SLP LONG TERM GOAL #2   Title demo 20 minutes speech with <3 instances vocal fry   Time 6   Period Weeks   Status New          Plan - 09/08/15 1653     Clinical Impression Statement Pt with cont difficulty with voice. She has seen a slow decline since her return to work in December 2015. Pt has not been able to shift to job responsibilities with less telephone assistance. Skilled ST needed to refresh pt's adherence to vocal hygiene and to ensure correct procedure with vocal function exercises provided today. Suspect pt will not need more than 6 weeks of therapy.   Speech Therapy Frequency 1x /week   Duration --  6 weeks   Treatment/Interventions Functional tasks;Patient/family education;Compensatory strategies;SLP instruction and feedback  HEP   Potential to Achieve Goals Good   Potential Considerations Severity of impairments   SLP Home Exercise Plan agreed to practice at home   Consulted and Agree with Plan of Care Patient        Problem List Patient Active Problem List   Diagnosis Date Noted  . Connective tissue disorder (HCC) 02/24/2015  . Dyspnea 12/13/2013  . Palpitations 12/13/2013  . Chest pain 12/13/2013  . Hypertension 08/26/2011    [facility] 09/08/2015, 5:03 PM  [facility] [address] [place], [place], [address] Phone: [phone]   Fax:  [phone]  Name: KARESSA DOSHI MRN: [id number] Date of Birth: [birth year]

## 2015-09-22 ENCOUNTER — Ambulatory Visit: Payer: 59

## 2015-10-02 ENCOUNTER — Ambulatory Visit: Payer: 59 | Attending: Otolaryngology

## 2015-10-09 ENCOUNTER — Ambulatory Visit: Payer: 59

## 2015-12-14 NOTE — Therapy (Signed)
[facility] [address] [place], [place], [address] Phone: [phone]   Fax:  [phone]  Patient Details  Name: Michelle Rojas MRN: [id number] Date of Birth: March 09, 1981 Referring Provider:  No ref. provider found  Encounter Date: 12/14/2015  SPEECH THERAPY DISCHARGE SUMMARY  Visits from Start of Care: one (evaluation), 09-08-15.  Current functional level related to goals / functional outcomes: Pt was evaluated and agreed on therapy plan. Unfortunately, she cancelled or missed scheduled treatments after the evaluation.  SLP filled out leave of absence/disability paperwork for pt and forwarded to claim representative as directed.   Remaining deficits: Suspected deficits remain.   Education / Equipment: Insurance underwriter.  Plan: Patient agrees to discharge.  Patient goals were not met. Patient is being discharged due to not returning since the last visit.  ?????       [facility] 12/14/2015, 1:39 PM  [facility] [address] [place], [place], [address] Phone: [phone]   Fax:  [phone]

## 2016-06-14 NOTE — H&P (Signed)
Michelle Rojas is an 35 y.o. female with missed abortion; anembryonic pregnancy measuring 8 weeks.  She declined expectant and medical management.  Blood type O positive.  Pertinent Gynecological History: Menses: n/a Bleeding: n/a Contraception: none DES exposure: unknown Blood transfusions: none Sexually transmitted diseases: no past history Previous GYN Procedures: none  Last mammogram: n/a Date: n/a Last pap: normal Date: 10/2015 OB History: G2, P1   Menstrual History: Menarche age: n/a No LMP recorded.    Past Medical History:  Diagnosis Date  . Hypertension    on hctz 25mg  daily    No past surgical history on file.  No family history on file.  Social History:  reports that she has never smoked. She does not have any smokeless tobacco history on file. She reports that she does not use drugs. Her alcohol history is not on file.  Allergies: No Known Allergies  No prescriptions prior to admission.    ROS  There were no vitals taken for this visit. Physical Exam  Constitutional: She is oriented to person, place, and time. She appears well-developed and well-nourished.  GI: Soft. There is no rebound and no guarding.  Neurological: She is alert and oriented to person, place, and time.  Skin: Skin is warm and dry.  Psychiatric: She has a normal mood and affect. Her behavior is normal.    No results found for this or any previous visit (from the past 24 hour(s)).  No results found.  Assessment/Plan: 35yo with missed abortion -Suction D&C -Patient is counseled re: risk of bleeding, infection, scarring and damage to surrounding structures.  All questions were answered and the patient wishes to proceed.  Twylah Bennetts 06/14/2016, 9:37 PM

## 2016-06-15 ENCOUNTER — Encounter (HOSPITAL_BASED_OUTPATIENT_CLINIC_OR_DEPARTMENT_OTHER): Payer: Self-pay | Admitting: *Deleted

## 2016-06-15 NOTE — Progress Notes (Addendum)
NPO AFTER MN.  ARRIVE AT 0600.  NEEDS ISTAT AND EKG .   CURRENT  ABO/Rh RESULTS W/ CHART (O+).   WILL TAKE ZANTAC AM DOS W/ SIPS OF WATER AND IF NEEDED TAKE NAUSEA MEDICATION.

## 2016-06-15 NOTE — Progress Notes (Addendum)
REVIEWED PRE-ORDERS , NOTED T&S ORDERED ,  CALLED AND LM FOR HEATHER IF DR MORRIS NEEDED THIS OR  ABO/RH  . AWAIT CALL BACK FOR DR MORRIS ANSWER.  REVIEWED CALL BACK FROM HEATHER.  PER DR MORRIS T & S NOT NEEDED AND PT'S BLOOD TYPE IS "O+"  AND HEATHER IS FAXING RESULTS.

## 2016-06-17 ENCOUNTER — Ambulatory Visit (HOSPITAL_BASED_OUTPATIENT_CLINIC_OR_DEPARTMENT_OTHER)
Admission: RE | Admit: 2016-06-17 | Discharge: 2016-06-17 | Disposition: A | Discharge status: Home / Self Care | Payer: 59 | Source: Ambulatory Visit | Attending: Obstetrics & Gynecology | Admitting: Obstetrics & Gynecology

## 2016-06-17 ENCOUNTER — Encounter (HOSPITAL_BASED_OUTPATIENT_CLINIC_OR_DEPARTMENT_OTHER): Payer: Self-pay

## 2016-06-17 ENCOUNTER — Ambulatory Visit (HOSPITAL_BASED_OUTPATIENT_CLINIC_OR_DEPARTMENT_OTHER): Payer: 59 | Admitting: Anesthesiology

## 2016-06-17 ENCOUNTER — Other Ambulatory Visit: Payer: Self-pay

## 2016-06-17 ENCOUNTER — Encounter (HOSPITAL_BASED_OUTPATIENT_CLINIC_OR_DEPARTMENT_OTHER)
Admission: RE | Disposition: A | Discharge status: Home / Self Care | Payer: Self-pay | Source: Ambulatory Visit | Attending: Obstetrics & Gynecology

## 2016-06-17 DIAGNOSIS — Z79899 Other long term (current) drug therapy: Secondary | ICD-10-CM | POA: Insufficient documentation

## 2016-06-17 DIAGNOSIS — I10 Essential (primary) hypertension: Secondary | ICD-10-CM | POA: Diagnosis not present

## 2016-06-17 DIAGNOSIS — O021 Missed abortion: Secondary | ICD-10-CM | POA: Insufficient documentation

## 2016-06-17 DIAGNOSIS — K219 Gastro-esophageal reflux disease without esophagitis: Secondary | ICD-10-CM | POA: Insufficient documentation

## 2016-06-17 HISTORY — DX: Gastro-esophageal reflux disease without esophagitis: K21.9

## 2016-06-17 HISTORY — DX: Personal history of other specified conditions: Z87.898

## 2016-06-17 HISTORY — DX: Presence of spectacles and contact lenses: Z97.3

## 2016-06-17 HISTORY — DX: Nausea: R11.0

## 2016-06-17 HISTORY — PX: DILATION AND EVACUATION: SHX1459

## 2016-06-17 HISTORY — DX: Other overlap syndromes: M35.1

## 2016-06-17 LAB — POCT I-STAT 4, (NA,K, GLUC, HGB,HCT)
GLUCOSE: 106 mg/dL — AB (ref 65–99)
HEMATOCRIT: 36 % (ref 36.0–46.0)
HEMOGLOBIN: 12.2 g/dL (ref 12.0–15.0)
POTASSIUM: 4.4 mmol/L (ref 3.5–5.1)
SODIUM: 142 mmol/L (ref 135–145)

## 2016-06-17 SURGERY — DILATION AND EVACUATION, UTERUS
Anesthesia: General

## 2016-06-17 MED ORDER — IBUPROFEN 600 MG PO TABS: 600.0000 mg | ORAL_TABLET | Freq: Four times a day (QID) | ORAL | 0 refills | Status: DC | PRN

## 2016-06-17 MED ORDER — ONDANSETRON HCL 4 MG/2ML IJ SOLN
4.0000 mg | Freq: Four times a day (QID) | INTRAMUSCULAR | Status: DC | PRN
  Filled 2016-06-17: qty 2

## 2016-06-17 MED ORDER — KETOROLAC TROMETHAMINE 30 MG/ML IJ SOLN
INTRAMUSCULAR | Status: AC
  Filled 2016-06-17: qty 1

## 2016-06-17 MED ORDER — DOXYCYCLINE HYCLATE 100 MG IV SOLR
100.0000 mg | Freq: Once | INTRAVENOUS | Status: AC
  Administered 2016-06-17: 100 mg via INTRAVENOUS
  Filled 2016-06-17 (×2): qty 100

## 2016-06-17 MED ORDER — OXYCODONE HCL 5 MG PO TABS: 5.0000 mg | ORAL_TABLET | Freq: Once | ORAL | 0 refills | Status: DC | PRN

## 2016-06-17 MED ORDER — KETOROLAC TROMETHAMINE 30 MG/ML IJ SOLN
INTRAMUSCULAR | Status: AC
  Filled 2016-06-17: qty 10

## 2016-06-17 MED ORDER — OXYCODONE HCL 5 MG/5ML PO SOLN
5.0000 mg | Freq: Once | ORAL | Status: DC | PRN
  Filled 2016-06-17: qty 5

## 2016-06-17 MED ORDER — LIDOCAINE HCL (CARDIAC) 20 MG/ML IV SOLN
INTRAVENOUS | Status: AC
  Filled 2016-06-17: qty 5

## 2016-06-17 MED ORDER — KETOROLAC TROMETHAMINE 30 MG/ML IJ SOLN
INTRAMUSCULAR | Status: DC | PRN
  Administered 2016-06-17: 30 mg via INTRAVENOUS

## 2016-06-17 MED ORDER — PROPOFOL 10 MG/ML IV BOLUS
INTRAVENOUS | Status: DC | PRN
Start: 2016-06-17 — End: 2016-06-17
  Administered 2016-06-17: 50 mg via INTRAVENOUS
  Administered 2016-06-17: 200 mg via INTRAVENOUS

## 2016-06-17 MED ORDER — ONDANSETRON HCL 4 MG/2ML IJ SOLN
INTRAMUSCULAR | Status: AC
  Filled 2016-06-17: qty 2

## 2016-06-17 MED ORDER — LACTATED RINGERS IV SOLN
INTRAVENOUS | Status: DC
Start: 2016-06-17 — End: 2016-06-17
  Administered 2016-06-17: 07:00:00 via INTRAVENOUS
  Filled 2016-06-17: qty 1000

## 2016-06-17 MED ORDER — DEXAMETHASONE SODIUM PHOSPHATE 10 MG/ML IJ SOLN
INTRAMUSCULAR | Status: AC
  Filled 2016-06-17: qty 1

## 2016-06-17 MED ORDER — OXYCODONE HCL 5 MG PO TABS
5.0000 mg | ORAL_TABLET | Freq: Once | ORAL | Status: DC | PRN
  Filled 2016-06-17: qty 1

## 2016-06-17 MED ORDER — FENTANYL CITRATE (PF) 100 MCG/2ML IJ SOLN
INTRAMUSCULAR | Status: DC | PRN
  Administered 2016-06-17 (×2): 50 ug via INTRAVENOUS

## 2016-06-17 MED ORDER — LIDOCAINE HCL (CARDIAC) 20 MG/ML IV SOLN
INTRAVENOUS | Status: DC | PRN
  Administered 2016-06-17: 70 mg via INTRAVENOUS

## 2016-06-17 MED ORDER — MIDAZOLAM HCL 5 MG/5ML IJ SOLN
INTRAMUSCULAR | Status: DC | PRN
Start: 2016-06-17 — End: 2016-06-17
  Administered 2016-06-17: 2 mg via INTRAVENOUS

## 2016-06-17 MED ORDER — FENTANYL CITRATE (PF) 100 MCG/2ML IJ SOLN
INTRAMUSCULAR | Status: AC
  Filled 2016-06-17: qty 2

## 2016-06-17 MED ORDER — FENTANYL CITRATE (PF) 100 MCG/2ML IJ SOLN
25.0000 ug | INTRAMUSCULAR | Status: DC | PRN
  Filled 2016-06-17: qty 1

## 2016-06-17 MED ORDER — PROPOFOL 10 MG/ML IV BOLUS
INTRAVENOUS | Status: AC
  Filled 2016-06-17: qty 40

## 2016-06-17 MED ORDER — DEXAMETHASONE SODIUM PHOSPHATE 4 MG/ML IJ SOLN
INTRAMUSCULAR | Status: DC | PRN
  Administered 2016-06-17: 10 mg via INTRAVENOUS

## 2016-06-17 MED ORDER — MIDAZOLAM HCL 2 MG/2ML IJ SOLN
INTRAMUSCULAR | Status: AC
  Filled 2016-06-17: qty 2

## 2016-06-17 MED ORDER — ONDANSETRON HCL 4 MG/2ML IJ SOLN
INTRAMUSCULAR | Status: DC | PRN
  Administered 2016-06-17: 4 mg via INTRAVENOUS

## 2016-06-17 MED ORDER — DOXYCYCLINE HYCLATE 100 MG PO TABS
200.0000 mg | ORAL_TABLET | Freq: Once | ORAL | Status: AC
  Administered 2016-06-17: 200 mg via ORAL
  Filled 2016-06-17 (×2): qty 2

## 2016-06-17 MED ORDER — LACTATED RINGERS IV SOLN
INTRAVENOUS | Status: DC
  Administered 2016-06-17: 07:00:00 via INTRAVENOUS
  Filled 2016-06-17: qty 1000

## 2016-06-17 SURGICAL SUPPLY — 33 items
CANISTER SUCTION 2500CC (MISCELLANEOUS) ×3 IMPLANT
CATH ROBINSON RED A/P 16FR (CATHETERS) ×3 IMPLANT
COVER BACK TABLE 60X90IN (DRAPES) ×4 IMPLANT
DRAPE LG THREE QUARTER DISP (DRAPES) ×3 IMPLANT
DRSG TELFA 3X8 NADH (GAUZE/BANDAGES/DRESSINGS) ×3 IMPLANT
ELECT REM PT RETURN 9FT ADLT (ELECTROSURGICAL)
ELECTRODE REM PT RTRN 9FT ADLT (ELECTROSURGICAL) IMPLANT
FILTER UTR ASPR ASSEMBLY (MISCELLANEOUS) ×3 IMPLANT
GLOVE BIO SURGEON STRL SZ 6 (GLOVE) ×6 IMPLANT
GLOVE BIOGEL PI IND STRL 6 (GLOVE) IMPLANT
GLOVE BIOGEL PI IND STRL 6.5 (GLOVE) ×2 IMPLANT
GLOVE BIOGEL PI INDICATOR 6 (GLOVE) ×4
GLOVE BIOGEL PI INDICATOR 6.5 (GLOVE)
GOWN STRL REUS W/ TWL LRG LVL3 (GOWN DISPOSABLE) ×1 IMPLANT
GOWN STRL REUS W/TWL LRG LVL3 (GOWN DISPOSABLE) ×3
HOSE CONNECTING 18IN BERKELEY (TUBING) ×8 IMPLANT
KIT ROOM TURNOVER WOR (KITS) ×3 IMPLANT
LEGGING LITHOTOMY PAIR STRL (DRAPES) ×3 IMPLANT
NDL SPNL 22GX3.5 QUINCKE BK (NEEDLE) IMPLANT
NEEDLE SPNL 22GX3.5 QUINCKE BK (NEEDLE) IMPLANT
PACK BASIN DAY SURGERY FS (CUSTOM PROCEDURE TRAY) ×3 IMPLANT
PAD DRESSING TELFA 3X8 NADH (GAUZE/BANDAGES/DRESSINGS) ×1 IMPLANT
SET BERKELEY SUCTION TUBING (SUCTIONS) ×3 IMPLANT
SYR CONTROL 10ML LL (SYRINGE) IMPLANT
TOWEL OR 17X24 6PK STRL BLUE (TOWEL DISPOSABLE) ×6 IMPLANT
TRAY DSU PREP LF (CUSTOM PROCEDURE TRAY) ×3 IMPLANT
TUBE CONNECTING 12'X1/4 (SUCTIONS)
TUBE CONNECTING 12X1/4 (SUCTIONS) IMPLANT
VACURETTE 6 ASPIR F TIP BERK (CANNULA) IMPLANT
VACURETTE 7MM CVD STRL WRAP (CANNULA) IMPLANT
VACURETTE 8 RIGID CVD (CANNULA) ×2 IMPLANT
VACURETTE 9 RIGID CVD (CANNULA) IMPLANT
WATER STERILE IRR 500ML POUR (IV SOLUTION) ×3 IMPLANT

## 2016-06-17 NOTE — Transfer of Care (Signed)
Immediate Anesthesia Transfer of Care Note  Patient: Michelle Rojas  Procedure(s) Performed: Procedure(s) with comments: DILATATION AND EVACUATION (N/A) - WTIH SUCTION  Patient Location: PACU  Anesthesia Type:General  Level of Consciousness: awake, alert , oriented and patient cooperative  Airway & Oxygen Therapy: Patient Spontanous Breathing and Patient connected to nasal cannula oxygen  Post-op Assessment: Report given to RN and Post -op Vital signs reviewed and stable  Post vital signs: Reviewed and stable  Last Vitals:  Vitals:   06/17/16 0616 06/17/16 0814  BP: (!) 145/93   Pulse: 90   Resp: 16   Temp: 37.2 C 36.9 C    Last Pain:  Vitals:   06/17/16 0616  TempSrc: Oral      Patients Stated Pain Goal: 6 (06/17/16 4163)  Complications: No apparent anesthesia complications

## 2016-06-17 NOTE — Anesthesia Postprocedure Evaluation (Signed)
Anesthesia Post Note  Patient: Michelle Rojas  Procedure(s) Performed: Procedure(s) (LRB): DILATATION AND EVACUATION (N/A)  Patient location during evaluation: PACU Anesthesia Type: General Level of consciousness: awake and alert and patient cooperative Pain management: pain level controlled Vital Signs Assessment: post-procedure vital signs reviewed and stable Respiratory status: spontaneous breathing and respiratory function stable Cardiovascular status: stable Anesthetic complications: no    Last Vitals:  Vitals:   06/17/16 0845 06/17/16 0942  BP: (!) 149/99 (!) 160/96  Pulse: 87 82  Resp: (!) 24 16  Temp:  37.3 C    Last Pain:  Vitals:   06/17/16 0942  TempSrc: Oral  PainSc:                  Azlyn Wingler S

## 2016-06-17 NOTE — Op Note (Signed)
PREOPERATIVE DIAGNOSIS: 35 y.o. with missed abortion measuring 8 weeks  POSTOPERATIVE DIAGNOSIS: The same  PROCEDURE: Suction Dilation and Curettage   SURGEON: Dr. [name]  INDICATIONS: 35 y.o. here for scheduled surgery for treatment of missed abortion Risks of surgery were discussed with the patient including but not limited to: bleeding which may require transfusion; infection which may require antibiotics; injury to uterus or surrounding organs; intrauterine scarring which may impair future fertility; need for additional procedures including laparotomy or laparoscopy; and other postoperative/anesthesia complications. Written informed consent was obtained.   FINDINGS: A 9 week size uterus.   ANESTHESIA:LMA   ESTIMATED BLOOD LOSS:100cc  SPECIMENS: POC sent to pathology   COMPLICATIONS: None immediate.  PROCEDURE DETAILS: The patient received intravenous antibiotics while in the preoperative area. She was then taken to the operating room where conscious sedation was administered and was found to be adequate. After an adequate timeout was performed, she was placed in the dorsal lithotomy position and examined; then prepped and draped in the sterile manner. Her bladder was catheterized for an unmeasured amount of clear, yellow urine. A speculum was then placed in the patient's vagina and a single tooth tenaculum was applied to the anterior lip of the cervix.The uterus was dilated manually with metal dilators to accommodate the 8 mm suction curette. Once the cervix was dilated, the suction curette was advanced without difficulty. A total of three passes were taken with tissue returned. A sharp curette was used to feel for gritty texture circumferentially which was present. A final pass with the suction curette was performed to remove blood. The tenaculum was removed from the anterior lip of the cervix and the vaginal speculum was removed after noting good  hemostasis. The patient tolerated the procedure well and was taken to the recovery area awake, extubated and in stable condition.

## 2016-06-17 NOTE — Progress Notes (Signed)
No change to H&P.  Michelle Fanguy, DO 

## 2016-06-17 NOTE — Anesthesia Preprocedure Evaluation (Signed)
Anesthesia Evaluation  Patient identified by MRN, date of birth, ID band Patient awake    Reviewed: Allergy & Precautions, NPO status , Patient's Chart, lab work & pertinent test results  Airway Mallampati: II   Neck ROM: full    Dental   Pulmonary shortness of breath,    breath sounds clear to auscultation       Cardiovascular hypertension,  Rhythm:regular Rate:Normal     Neuro/Psych    GI/Hepatic GERD  ,  Endo/Other    Renal/GU      Musculoskeletal   Abdominal   Peds  Hematology   Anesthesia Other Findings   Reproductive/Obstetrics Missed abortion                             Anesthesia Physical Anesthesia Plan  ASA: II  Anesthesia Plan: General   Post-op Pain Management:    Induction: Intravenous  Airway Management Planned: LMA  Additional Equipment:   Intra-op Plan:   Post-operative Plan:   Informed Consent: I have reviewed the patients History and Physical, chart, labs and discussed the procedure including the risks, benefits and alternatives for the proposed anesthesia with the patient or authorized representative who has indicated his/her understanding and acceptance.     Plan Discussed with: CRNA, Anesthesiologist and Surgeon  Anesthesia Plan Comments:         Anesthesia Quick Evaluation

## 2016-06-17 NOTE — Anesthesia Procedure Notes (Signed)
Procedure Name: LMA Insertion Date/Time: 06/17/2016 7:40 AM Performed by: Tyrone Nine Pre-anesthesia Checklist: Patient identified, Emergency Drugs available, Suction available, Patient being monitored and Timeout performed Patient Re-evaluated:Patient Re-evaluated prior to inductionOxygen Delivery Method: Circle system utilized Preoxygenation: Pre-oxygenation with 100% oxygen Intubation Type: IV induction Ventilation: Mask ventilation without difficulty LMA: LMA inserted LMA Size: 4.0 Number of attempts: 1 Placement Confirmation: positive ETCO2 and breath sounds checked- equal and bilateral Tube secured with: Tape Dental Injury: Teeth and Oropharynx as per pre-operative assessment

## 2016-06-17 NOTE — Discharge Instructions (Signed)
°  Post Anesthesia Home Care Instructions  Activity: Get plenty of rest for the remainder of the day. A responsible adult should stay with you for 24 hours following the procedure.  For the next 24 hours, DO NOT: -Drive a car -Advertising copywriter -Drink alcoholic beverages -Take any medication unless instructed by your physician -Make any legal decisions or sign important papers.  Meals: Start with liquid foods such as gelatin or soup. Progress to regular foods as tolerated. Avoid greasy, spicy, heavy foods. If nausea and/or vomiting occur, drink only clear liquids until the nausea and/or vomiting subsides. Call your physician if vomiting continues.  Special Instructions/Symptoms: Your throat may feel dry or sore from the anesthesia or the breathing tube placed in your throat during surgery. If this causes discomfort, gargle with warm salt water. The discomfort should disappear within 24 hours.    Call MD for T>100.4, heavy vaginal bleeding, severe abdominal pain, or respiratory distress.  Call office to schedule postop appointment in 2 weeks.  No driving while taking narcotics.  Pelvic rest x 2 weeks.

## 2016-06-20 ENCOUNTER — Encounter (HOSPITAL_BASED_OUTPATIENT_CLINIC_OR_DEPARTMENT_OTHER): Payer: Self-pay | Admitting: Obstetrics & Gynecology

## 2017-02-23 LAB — OB RESULTS CONSOLE RPR: RPR: NONREACTIVE

## 2017-02-23 LAB — OB RESULTS CONSOLE ANTIBODY SCREEN: Antibody Screen: NEGATIVE

## 2017-02-23 LAB — OB RESULTS CONSOLE HEPATITIS B SURFACE ANTIGEN: Hepatitis B Surface Ag: NEGATIVE

## 2017-02-23 LAB — OB RESULTS CONSOLE GC/CHLAMYDIA
Chlamydia: NEGATIVE
GC PROBE AMP, GENITAL: NEGATIVE

## 2017-02-23 LAB — OB RESULTS CONSOLE ABO/RH: RH Type: POSITIVE

## 2017-02-23 LAB — OB RESULTS CONSOLE RUBELLA ANTIBODY, IGM: Rubella: IMMUNE

## 2017-02-23 LAB — OB RESULTS CONSOLE HIV ANTIBODY (ROUTINE TESTING): HIV: NONREACTIVE

## 2017-03-07 ENCOUNTER — Encounter (HOSPITAL_COMMUNITY): Payer: Self-pay | Admitting: *Deleted

## 2017-03-07 ENCOUNTER — Inpatient Hospital Stay (HOSPITAL_COMMUNITY)
Admission: AD | Admit: 2017-03-07 | Discharge: 2017-03-07 | Disposition: A | Discharge status: Home / Self Care | Payer: 59 | Source: Ambulatory Visit | Attending: Obstetrics and Gynecology | Admitting: Obstetrics and Gynecology

## 2017-03-07 DIAGNOSIS — Z79899 Other long term (current) drug therapy: Secondary | ICD-10-CM | POA: Diagnosis not present

## 2017-03-07 DIAGNOSIS — O99611 Diseases of the digestive system complicating pregnancy, first trimester: Secondary | ICD-10-CM | POA: Insufficient documentation

## 2017-03-07 DIAGNOSIS — Z9889 Other specified postprocedural states: Secondary | ICD-10-CM | POA: Insufficient documentation

## 2017-03-07 DIAGNOSIS — Z3A09 9 weeks gestation of pregnancy: Secondary | ICD-10-CM | POA: Insufficient documentation

## 2017-03-07 DIAGNOSIS — O10911 Unspecified pre-existing hypertension complicating pregnancy, first trimester: Secondary | ICD-10-CM | POA: Diagnosis not present

## 2017-03-07 DIAGNOSIS — O9989 Other specified diseases and conditions complicating pregnancy, childbirth and the puerperium: Secondary | ICD-10-CM | POA: Insufficient documentation

## 2017-03-07 DIAGNOSIS — O219 Vomiting of pregnancy, unspecified: Secondary | ICD-10-CM | POA: Diagnosis present

## 2017-03-07 DIAGNOSIS — O10919 Unspecified pre-existing hypertension complicating pregnancy, unspecified trimester: Secondary | ICD-10-CM

## 2017-03-07 DIAGNOSIS — M351 Other overlap syndromes: Secondary | ICD-10-CM | POA: Insufficient documentation

## 2017-03-07 DIAGNOSIS — O210 Mild hyperemesis gravidarum: Secondary | ICD-10-CM | POA: Diagnosis not present

## 2017-03-07 DIAGNOSIS — K219 Gastro-esophageal reflux disease without esophagitis: Secondary | ICD-10-CM | POA: Diagnosis not present

## 2017-03-07 LAB — CBC WITH DIFFERENTIAL/PLATELET
BASOS ABS: 0 10*3/uL (ref 0.0–0.1)
Basophils Relative: 0 %
EOS ABS: 0.1 10*3/uL (ref 0.0–0.7)
EOS PCT: 2 %
HCT: 35.8 % — ABNORMAL LOW (ref 36.0–46.0)
Hemoglobin: 12 g/dL (ref 12.0–15.0)
LYMPHS PCT: 34 %
Lymphs Abs: 2.1 10*3/uL (ref 0.7–4.0)
MCH: 26.4 pg (ref 26.0–34.0)
MCHC: 33.5 g/dL (ref 30.0–36.0)
MCV: 78.7 fL (ref 78.0–100.0)
Monocytes Absolute: 0.6 10*3/uL (ref 0.1–1.0)
Monocytes Relative: 9 %
NEUTROS ABS: 3.5 10*3/uL (ref 1.7–7.7)
NEUTROS PCT: 55 %
PLATELETS: 219 10*3/uL (ref 150–400)
RBC: 4.55 MIL/uL (ref 3.87–5.11)
RDW: 13.9 % (ref 11.5–15.5)
WBC: 6.3 10*3/uL (ref 4.0–10.5)

## 2017-03-07 LAB — COMPREHENSIVE METABOLIC PANEL
ALT: 20 U/L (ref 14–54)
AST: 21 U/L (ref 15–41)
Albumin: 3.8 g/dL (ref 3.5–5.0)
Alkaline Phosphatase: 53 U/L (ref 38–126)
Anion gap: 6 (ref 5–15)
BUN: 11 mg/dL (ref 6–20)
CHLORIDE: 108 mmol/L (ref 101–111)
CO2: 25 mmol/L (ref 22–32)
CREATININE: 0.75 mg/dL (ref 0.44–1.00)
Calcium: 9.3 mg/dL (ref 8.9–10.3)
GFR calc Af Amer: >60 mL/min (ref >60)
GFR calc non Af Amer: >60 mL/min (ref >60)
Glucose, Bld: 89 mg/dL (ref 65–99)
Potassium: 4 mmol/L (ref 3.5–5.1)
SODIUM: 139 mmol/L (ref 135–145)
Total Bilirubin: 0.4 mg/dL (ref 0.3–1.2)
Total Protein: 7.7 g/dL (ref 6.5–8.1)

## 2017-03-07 LAB — URINALYSIS, ROUTINE W REFLEX MICROSCOPIC
Bilirubin Urine: NEGATIVE
Glucose, UA: NEGATIVE mg/dL
Hgb urine dipstick: NEGATIVE
Ketones, ur: NEGATIVE mg/dL
Leukocytes, UA: NEGATIVE
NITRITE: NEGATIVE
Protein, ur: NEGATIVE mg/dL
SPECIFIC GRAVITY, URINE: 1.026 (ref 1.005–1.030)
pH: 6 (ref 5.0–8.0)

## 2017-03-07 MED ORDER — LABETALOL HCL 100 MG PO TABS
100.0000 mg | ORAL_TABLET | Freq: Once | ORAL | Status: AC
  Administered 2017-03-07: 100 mg via ORAL
  Filled 2017-03-07: qty 1

## 2017-03-07 MED ORDER — LABETALOL HCL 100 MG PO TABS: 100.0000 mg | ORAL_TABLET | Freq: Two times a day (BID) | ORAL | 0 refills | Status: DC

## 2017-03-07 MED ORDER — PROMETHAZINE HCL 25 MG/ML IJ SOLN
25.0000 mg | Freq: Once | INTRAVENOUS | Status: AC
  Administered 2017-03-07: 25 mg via INTRAVENOUS
  Filled 2017-03-07: qty 1

## 2017-03-07 NOTE — MAU Note (Signed)
Just can't keep anything down.  Has really bad heartburn.  Is spitting a lot.  Lips are dry.  Is taking Diglegis.

## 2017-03-07 NOTE — MAU Provider Note (Signed)
History     CSN: [id number]  Arrival date and time: 03/07/17 1745   None     Chief Complaint  Patient presents with  . Emesis During Pregnancy  . Heartburn   HPI Michelle Rojas is 36 y.o. G2P1001 [redacted]w[redacted]d weeks presenting with nausea and vomiting in pregnancy.  She is a patient of Dr. Langston Masker'.  She is vomiting 6-7/ day, waking up at night to vomit. "terrible heartburn".  Unable to keep any food down, trying to drink more fluid.  Spitting "foamy stuff".  She states she spoke with the office several times today and was sent here for IV fluids.  She is taking maximum dose of Diclegis and Zantac without relief of sxs.  She has chronic hypertension.  Has not taken medication in 1 week because she wasn't sure medication was safe. BP readings elevated.  Neg for vaginal bleeding.     Past Medical History:  Diagnosis Date  . GERD (gastroesophageal reflux disease)   . History of palpitations    negative work-up by cardiologist-- dr Eden Emms-- 2015  . Hypertension    currently pt stopped taking medication due to pregnancy  . MCTD (mixed connective tissue disease) [facility])    dx 07/ 2015--  rheumotologist--  dr Zenovia Jordan  . Nausea   . Wears contact lenses     Past Surgical History:  Procedure Laterality Date  . DILATION AND EVACUATION N/A 06/17/2016   Procedure: DILATATION AND EVACUATION;  Surgeon: [name], DO;  Location: [facility];  Service: Gynecology;  Laterality: N/A;  WTIH SUCTION  . WISDOM TOOTH EXTRACTION  2012    History reviewed. No pertinent family history.  Social History  Substance Use Topics  . Smoking status: Never Smoker  . Smokeless tobacco: Never Used  . Alcohol use Not on file     Comment: before pregnancy -- occasional    Allergies: No Known Allergies  Prescriptions Prior to Admission  Medication Sig Dispense Refill Last Dose  . Doxylamine-Pyridoxine (DICLEGIS PO) Take 1-2 tablets by mouth 2 (two) times daily. Pt takes 1 tablet in the AM  and 2 tablets at night.   03/07/2017 at Unknown time  . Prenatal Vit-Fe Fumarate-FA (MULTIVITAMIN-PRENATAL) 27-0.8 MG TABS tablet Take 1 tablet by mouth at bedtime.    03/06/2017 at Unknown time  . ranitidine (ZANTAC) 150 MG capsule Take 150 mg by mouth daily as needed for heartburn.    03/07/2017 at Unknown time  . valACYclovir (VALTREX) 500 MG tablet Take by mouth daily as needed. For cold sore flare-ups   Past Month at Unknown time  . lisinopril-hydrochlorothiazide (PRINZIDE,ZESTORETIC) 20-12.5 MG per tablet Take 1 tablet by mouth daily.   02/21/2017  . omeprazole (PRILOSEC) 40 MG capsule Take 1 tab before breakfast and dinner, twice daily. (Patient not taking: Reported on 03/07/2017) 60 capsule 3 Not Taking at Unknown time    Review of Systems  Constitutional: Positive for appetite change.  Gastrointestinal: Positive for nausea and vomiting.       + for spitting + for heartburn  Genitourinary: Negative for dysuria, pelvic pain and vaginal bleeding.   Physical Exam   Blood pressure (!) 174/99, pulse 76, temperature 99 F (37.2 C), temperature source Oral, resp. rate 16, weight 196 lb (88.9 kg), last menstrual period 01/04/2016, SpO2 100 %.  Physical Exam  Constitutional: She is oriented to person, place, and time. She appears well-developed and well-nourished. No distress.  HENT:  Head: Normocephalic.  Neck: Normal range of motion.  Respiratory: Effort normal.  GI: Soft. There is no tenderness.  Genitourinary:  Genitourinary Comments: deferred  Neurological: She is alert and oriented to person, place, and time.  Skin: Skin is warm and dry.  Psychiatric: She has a normal mood and affect. Her behavior is normal. Thought content normal.   Results for orders placed or performed during the hospital encounter of 03/07/17 (from the past 24 hour(s))  Urinalysis, Routine w reflex microscopic     Status: Abnormal   Collection Time: 03/07/17  6:12 PM  Result Value Ref Range   Color, Urine  YELLOW YELLOW   APPearance HAZY (A) CLEAR   Specific Gravity, Urine 1.026 1.005 - 1.030   pH 6.0 5.0 - 8.0   Glucose, UA NEGATIVE NEGATIVE mg/dL   Hgb urine dipstick NEGATIVE NEGATIVE   Bilirubin Urine NEGATIVE NEGATIVE   Ketones, ur NEGATIVE NEGATIVE mg/dL   Protein, ur NEGATIVE NEGATIVE mg/dL   Nitrite NEGATIVE NEGATIVE   Leukocytes, UA NEGATIVE NEGATIVE  CBC with Differential/Platelet     Status: Abnormal   Collection Time: 03/07/17  6:49 PM  Result Value Ref Range   WBC 6.3 4.0 - 10.5 K/uL   RBC 4.55 3.87 - 5.11 MIL/uL   Hemoglobin 12.0 12.0 - 15.0 g/dL   HCT 16.1 (L) 09.6 - 04.5 %   MCV 78.7 78.0 - 100.0 fL   MCH 26.4 26.0 - 34.0 pg   MCHC 33.5 30.0 - 36.0 g/dL   RDW 40.9 81.1 - 91.4 %   Platelets 219 150 - 400 K/uL   Neutrophils Relative % 55 %   Neutro Abs 3.5 1.7 - 7.7 K/uL   Lymphocytes Relative 34 %   Lymphs Abs 2.1 0.7 - 4.0 K/uL   Monocytes Relative 9 %   Monocytes Absolute 0.6 0.1 - 1.0 K/uL   Eosinophils Relative 2 %   Eosinophils Absolute 0.1 0.0 - 0.7 K/uL   Basophils Relative 0 %   Basophils Absolute 0.0 0.0 - 0.1 K/uL  Comprehensive metabolic panel     Status: None   Collection Time: 03/07/17  6:49 PM  Result Value Ref Range   Sodium 139 135 - 145 mmol/L   Potassium 4.0 3.5 - 5.1 mmol/L   Chloride 108 101 - 111 mmol/L   CO2 25 22 - 32 mmol/L   Glucose, Bld 89 65 - 99 mg/dL   BUN 11 6 - 20 mg/dL   Creatinine, Ser 7.82 0.44 - 1.00 mg/dL   Calcium 9.3 8.9 - 95.6 mg/dL   Total Protein 7.7 6.5 - 8.1 g/dL   Albumin 3.8 3.5 - 5.0 g/dL   AST 21 15 - 41 U/L   ALT 20 14 - 54 U/L   Alkaline Phosphatase 53 38 - 126 U/L   Total Bilirubin 0.4 0.3 - 1.2 mg/dL   GFR calc non Af Amer >60 >60 mL/min   GFR calc Af Amer >60 >60 mL/min   Anion gap 6 5 - 15    Today's Vitals   03/07/17 1841 03/07/17 1854 03/07/17 1902 03/07/17 1911  BP: (!) 169/101 (!) 154/100 (!) 162/91 (!) 174/99  Pulse: 86 79 (!) 101 76  Resp:      Temp:      TempSrc:      SpO2:       Weight:      PainSc:       MAU Course  Procedures   Phenergan  in 1 liter of LR administered in MAU  MDM MSE Exam Labs  IV hydration Consulted with Dr. Vincente Poli-- MSE and BP readings discussed.  Orders per Dr. Vincente Poli give Labetalol  po now and begin  bid starting tomorrow.  Instructed to call the office tomorrow am and schedule BP check for Friday.  She has scheduled appointment for next week, encouraged to keep that appointment.  Negative for vomiting in MAU.  Assessment and Plan  A:  Hyperemesis in first trimester pregnancy      Uncontrolled chronic hypertension, untreated  P:  Discharge to home      Stressed importance of taking blood pressure medication as directed      Rx for Labetalol sent to pharmacy      Continue to use Diclegis she has at home.       Call office tomorrow to scheduled BP check for Friday.  Dennison Mascot Key 03/07/2017, 7:55 PM

## 2017-03-07 NOTE — MAU Note (Signed)
Went off her BP meds when found out preg.

## 2017-03-07 NOTE — Discharge Instructions (Signed)
Hypertension During Pregnancy Hypertension is also called high blood pressure. High blood pressure means that the force of your blood moving in your body is too strong. When you are pregnant, this condition should be watched carefully. It can cause problems for you and your baby. Follow these instructions at home: Eating and drinking   Drink enough fluid to keep your pee (urine) clear or pale yellow.  Eat healthy foods that are low in salt (sodium).  Do not add salt to your food.  Check labels on foods and drinks to see much salt is in them. Look on the label where you see "Sodium." Lifestyle   Do not use any products that contain nicotine or tobacco, such as cigarettes and e-cigarettes. If you need help quitting, ask your doctor.  Do not use alcohol.  Avoid caffeine.  Avoid stress. Rest and get plenty of sleep. General instructions   Take over-the-counter and prescription medicines only as told by your doctor.  While lying down, lie on your left side. This keeps pressure off your baby.  While sitting or lying down, raise (elevate) your feet. Try putting some pillows under your lower legs.  Exercise regularly. Ask your doctor what kinds of exercise are best for you.  Keep all prenatal and follow-up visits as told by your doctor. This is important. Contact a doctor if:  You have symptoms that your doctor told you to watch for, such as:  Fever.  Throwing up (vomiting).  Headache. Get help right away if:  You have very bad pain in your belly (abdomen).  You are throwing up, and this does not get better with treatment.  You suddenly get swelling in your hands, ankles, or face.  You gain 4 lb (1.8 kg) or more in 1 week.  You get bleeding from your vagina.  You have blood in your pee.  You do not feel your baby moving as much as normal.  You have a change in vision.  You have muscle twitching or sudden tightening (spasms).  You have trouble breathing.  Your  lips or fingernails turn blue. This information is not intended to replace advice given to you by your health care provider. Make sure you discuss any questions you have with your health care provider. Document Released: 12/10/2010 Document Revised: 07/19/2016 Document Reviewed: 07/19/2016 Elsevier Interactive Patient Education  2017 [employer] Hyperemesis Gravidarum Hyperemesis gravidarum is a severe form of nausea and vomiting that happens during pregnancy. Hyperemesis is worse than morning sickness. It may cause you to have nausea or vomiting all day for many days. It may keep you from eating and drinking enough food and liquids. Hyperemesis usually occurs during the first half (the first 20 weeks) of pregnancy. It often goes away once a woman is in her second half of pregnancy. However, sometimes hyperemesis continues through an entire pregnancy. What are the causes? The cause of this condition is not known. It may be related to changes in chemicals (hormones) in the body during pregnancy, such as the high level of pregnancy hormone (human chorionic gonadotropin) or the increase in the female sex hormone (estrogen). What are the signs or symptoms? Symptoms of this condition include:  Severe nausea and vomiting.  Nausea that does not go away.  Vomiting that does not allow you to keep any food down.  Weight loss.  Body fluid loss (dehydration).  Having no desire to eat, or not liking food that you have previously enjoyed. How is this diagnosed? This condition may  be diagnosed based on:  A physical exam.  Your medical history.  Your symptoms.  Blood tests.  Urine tests. How is this treated? This condition may be managed with medicine. If medicines to do not help relieve nausea and vomiting, you may need to receive fluids through an IV tube at the hospital. Follow these instructions at home:  Take over-the-counter and prescription medicines only as told by your health care  provider.  Avoid iron pills and multivitamins that contain iron for the first 3-4 months of pregnancy. If you take prescription iron pills, do not stop taking them unless your health care provider approves.  Take the following actions to help prevent nausea and vomiting:  In the morning, before getting out of bed, try eating a couple of dry crackers or a piece of toast.  Avoid foods and smells that upset your stomach. Fatty and spicy foods may make nausea worse.  Eat 5-6 small meals a day.  Do not drink fluids while eating meals. Drink between meals.  Eat or suck on things that have ginger in them. Ginger can help relieve nausea.  Avoid food preparation. The smell of food can spoil your appetite or trigger nausea.  Follow instructions from your health care provider about eating or drinking restrictions.  For snacks, eat high-protein foods, such as cheese.  Keep all follow-up and pre-birth (prenatal) visits as told by your health care provider. This is important. Contact a health care provider if:  You have pain in your abdomen.  You have a severe headache.  You have vision problems.  You are losing weight. Get help right away if:  You cannot drink fluids without vomiting.  You vomit blood.  You have constant nausea and vomiting.  You are very weak.  You are very thirsty.  You feel dizzy.  You faint.  You have a fever or other symptoms that last for more than 2-3 days.  You have a fever and your symptoms suddenly get worse. Summary  Hyperemesis gravidarum is a severe form of nausea and vomiting that happens during pregnancy.  Making some changes to your eating habits may help relieve nausea and vomiting.  This condition may be managed with medicine.  If medicines to do not help relieve nausea and vomiting, you may need to receive fluids through an IV tube at the hospital. This information is not intended to replace advice given to you by your health care  provider. Make sure you discuss any questions you have with your health care provider. Document Released: 11/07/2005 Document Revised: 07/06/2016 Document Reviewed: 07/06/2016 Elsevier Interactive Patient Education  2017 [employer].

## 2017-04-05 ENCOUNTER — Inpatient Hospital Stay (HOSPITAL_COMMUNITY)
Admission: AD | Admit: 2017-04-05 | Discharge: 2017-04-05 | Disposition: A | Discharge status: Home / Self Care | Payer: 59 | Source: Ambulatory Visit | Attending: Obstetrics and Gynecology | Admitting: Obstetrics and Gynecology

## 2017-04-05 ENCOUNTER — Encounter (HOSPITAL_COMMUNITY): Payer: Self-pay

## 2017-04-05 DIAGNOSIS — Z79899 Other long term (current) drug therapy: Secondary | ICD-10-CM | POA: Insufficient documentation

## 2017-04-05 DIAGNOSIS — Z3A13 13 weeks gestation of pregnancy: Secondary | ICD-10-CM | POA: Diagnosis not present

## 2017-04-05 DIAGNOSIS — O10012 Pre-existing essential hypertension complicating pregnancy, second trimester: Secondary | ICD-10-CM | POA: Insufficient documentation

## 2017-04-05 DIAGNOSIS — O99611 Diseases of the digestive system complicating pregnancy, first trimester: Secondary | ICD-10-CM | POA: Diagnosis not present

## 2017-04-05 DIAGNOSIS — K219 Gastro-esophageal reflux disease without esophagitis: Secondary | ICD-10-CM | POA: Diagnosis not present

## 2017-04-05 DIAGNOSIS — O10912 Unspecified pre-existing hypertension complicating pregnancy, second trimester: Secondary | ICD-10-CM

## 2017-04-05 DIAGNOSIS — O219 Vomiting of pregnancy, unspecified: Secondary | ICD-10-CM | POA: Insufficient documentation

## 2017-04-05 LAB — COMPREHENSIVE METABOLIC PANEL
ALK PHOS: 57 U/L (ref 38–126)
ALT: 38 U/L (ref 14–54)
ANION GAP: 5 (ref 5–15)
AST: 32 U/L (ref 15–41)
Albumin: 3.5 g/dL (ref 3.5–5.0)
BILIRUBIN TOTAL: 0.2 mg/dL — AB (ref 0.3–1.2)
BUN: 6 mg/dL (ref 6–20)
CALCIUM: 9.3 mg/dL (ref 8.9–10.3)
CO2: 25 mmol/L (ref 22–32)
Chloride: 106 mmol/L (ref 101–111)
Creatinine, Ser: 0.64 mg/dL (ref 0.44–1.00)
GFR calc non Af Amer: >60 mL/min (ref >60)
Glucose, Bld: 79 mg/dL (ref 65–99)
POTASSIUM: 3.8 mmol/L (ref 3.5–5.1)
Sodium: 136 mmol/L (ref 135–145)
TOTAL PROTEIN: 7.2 g/dL (ref 6.5–8.1)

## 2017-04-05 LAB — URINALYSIS, ROUTINE W REFLEX MICROSCOPIC
BILIRUBIN URINE: NEGATIVE
Glucose, UA: NEGATIVE mg/dL
HGB URINE DIPSTICK: NEGATIVE
KETONES UR: NEGATIVE mg/dL
LEUKOCYTES UA: NEGATIVE
NITRITE: NEGATIVE
PH: 8 (ref 5.0–8.0)
Protein, ur: 30 mg/dL — AB
SPECIFIC GRAVITY, URINE: 1.023 (ref 1.005–1.030)

## 2017-04-05 MED ORDER — LABETALOL HCL 100 MG PO TABS
200.0000 mg | ORAL_TABLET | Freq: Once | ORAL | Status: AC
  Administered 2017-04-05: 200 mg via ORAL
  Filled 2017-04-05: qty 2

## 2017-04-05 MED ORDER — FAMOTIDINE IN NACL 20-0.9 MG/50ML-% IV SOLN
20.0000 mg | Freq: Once | INTRAVENOUS | Status: AC
  Administered 2017-04-05: 20 mg via INTRAVENOUS
  Filled 2017-04-05: qty 50

## 2017-04-05 MED ORDER — METOCLOPRAMIDE HCL 5 MG/ML IJ SOLN
10.0000 mg | Freq: Once | INTRAMUSCULAR | Status: AC
Start: 2017-04-05 — End: 2017-04-05
  Administered 2017-04-05: 10 mg via INTRAVENOUS
  Filled 2017-04-05: qty 2

## 2017-04-05 MED ORDER — LABETALOL HCL 5 MG/ML IV SOLN: 20.0000 mg | Freq: Once | INTRAVENOUS | Status: DC

## 2017-04-05 MED ORDER — FAMOTIDINE IN NACL 20-0.9 MG/50ML-% IV SOLN: 40.0000 mg | Freq: Once | INTRAVENOUS | Status: DC

## 2017-04-05 MED ORDER — RANITIDINE HCL 150 MG PO CAPS: 150.0000 mg | ORAL_CAPSULE | Freq: Two times a day (BID) | ORAL | 1 refills | Status: DC

## 2017-04-05 MED ORDER — ONDANSETRON HCL 40 MG/20ML IJ SOLN
8.0000 mg | Freq: Once | INTRAMUSCULAR | Status: AC
Start: 2017-04-05 — End: 2017-04-05
  Administered 2017-04-05: 8 mg via INTRAVENOUS
  Filled 2017-04-05: qty 4

## 2017-04-05 MED ORDER — M.V.I. ADULT IV INJ
Freq: Once | INTRAVENOUS | Status: AC
  Administered 2017-04-05: 17:00:00 via INTRAVENOUS
  Filled 2017-04-05: qty 1000

## 2017-04-05 NOTE — MAU Note (Signed)
Patient presents with c/o N/V for the past 4 days. Patient also states that she has a sensation in her throat like something is stuck in there.

## 2017-04-05 NOTE — Discharge Instructions (Signed)
Hypertension During Pregnancy °Hypertension, commonly called high blood pressure, is when the force of blood pumping through your arteries is too strong. Arteries are blood vessels that carry blood from the heart throughout the body. Hypertension during pregnancy can cause problems for you and your baby. Your baby may be born early (prematurely) or may not weigh as much as he or she should at birth. Very bad cases of hypertension during pregnancy can be life-threatening. °Different types of hypertension can occur during pregnancy. These include: °· Chronic hypertension. This happens when: °¨ You have hypertension before pregnancy and it continues during pregnancy. °¨ You develop hypertension before you are [redacted] weeks pregnant, and it continues during pregnancy. °· Gestational hypertension. This is hypertension that develops after the 20th week of pregnancy. °· Preeclampsia, also called toxemia of pregnancy. This is a very serious type of hypertension that develops only during pregnancy. It affects the whole body, and it can be very dangerous for you and your baby. °Gestational hypertension and preeclampsia usually go away within 6 weeks after your baby is born. Women who have hypertension during pregnancy have a greater chance of developing hypertension later in life or during future pregnancies. °What are the causes? °The exact cause of hypertension is not known. °What increases the risk? °There are certain factors that make it more likely for you to develop hypertension during pregnancy. These include: °· Having hypertension during a previous pregnancy or prior to pregnancy. °· Being overweight. °· Being older than age 40. °· Being pregnant for the first time or being pregnant with more than one baby. °· Becoming pregnant using fertilization methods such as IVF (in vitro fertilization). °· Having diabetes, kidney problems, or systemic lupus erythematosus. °· Having a family history of hypertension. °What are the  signs or symptoms? °Chronic hypertension and gestational hypertension rarely cause symptoms. Preeclampsia causes symptoms, which may include: °· Increased protein in your urine. Your health care provider will check for this at every visit before you give birth (prenatal visit). °· Severe headaches. °· Sudden weight gain. °· Swelling of the hands, face, legs, and feet. °· Nausea and vomiting. °· Vision problems, such as blurred or double vision. °· Numbness in the face, arms, legs, and feet. °· Dizziness. °· Slurred speech. °· Sensitivity to bright lights. °· Abdominal pain. °· Convulsions. °How is this diagnosed? °You may be diagnosed with hypertension during a routine prenatal exam. At each prenatal visit, you may: °· Have a urine test to check for high amounts of protein in your urine. °· Have your blood pressure checked. A blood pressure reading is recorded as two numbers, such as "120 over 80" (or 120/80). The first ("top") number is called the systolic pressure. It is a measure of the pressure in your arteries when your heart beats. The second ("bottom") number is called the diastolic pressure. It is a measure of the pressure in your arteries as your heart relaxes between beats. Blood pressure is measured in a unit called mm Hg. A normal blood pressure reading is: °¨ Systolic: below 120. °¨ Diastolic: below 80. °The type of hypertension that you are diagnosed with depends on your test results and when your symptoms developed. °· Chronic hypertension is usually diagnosed before 20 weeks of pregnancy. °· Gestational hypertension is usually diagnosed after 20 weeks of pregnancy. °· Hypertension with high amounts of protein in the urine is diagnosed as preeclampsia. °· Blood pressure measurements that stay above 160 systolic, or above 110 diastolic, are signs of severe preeclampsia. °  How is this treated? Treatment for hypertension during pregnancy varies depending on the type of hypertension you have and how  serious it is.  If you take medicines called ACE inhibitors to treat chronic hypertension, you may need to switch medicines. ACE inhibitors should not be taken during pregnancy.  If you have gestational hypertension, you may need to take blood pressure medicine.  If you are at risk for preeclampsia, your health care provider may recommend that you take a low-dose aspirin every day to prevent high blood pressure during your pregnancy.  If you have severe preeclampsia, you may need to be hospitalized so you and your baby can be monitored closely. You may also need to take medicine (magnesium sulfate) to prevent seizures and to lower blood pressure. This medicine may be given as an injection or through an IV tube.  In some cases, if your condition gets worse, you may need to deliver your baby early. Follow these instructions at home: Eating and drinking   Drink enough fluid to keep your urine clear or pale yellow.  Eat a healthy diet that is low in salt (sodium). Do not add salt to your food. Check food labels to see how much sodium a food or beverage contains. Lifestyle   Do not use any products that contain nicotine or tobacco, such as cigarettes and e-cigarettes. If you need help quitting, ask your health care provider.  Do not use alcohol.  Avoid caffeine.  Avoid stress as much as possible. Rest and get plenty of sleep. General instructions   Take over-the-counter and prescription medicines only as told by your health care provider.  While lying down, lie on your left side. This keeps pressure off your baby.  While sitting or lying down, raise (elevate) your feet. Try putting some pillows under your lower legs.  Exercise regularly. Ask your health care provider what kinds of exercise are best for you.  Keep all prenatal and follow-up visits as told by your health care provider. This is important. Contact a health care provider if:  You have symptoms that your health care  provider told you may require more treatment or monitoring, such as:  Fever.  Vomiting.  Headache. Get help right away if:  You have severe abdominal pain or vomiting that does not get better with treatment.  You suddenly develop swelling in your hands, ankles, or face.  You gain 4 lbs (1.8 kg) or more in 1 week.  You develop vaginal bleeding, or you have blood in your urine.  You do not feel your baby moving as much as usual.  You have blurred or double vision.  You have muscle twitching or sudden tightening (spasms).  You have shortness of breath.  Your lips or fingernails turn blue. This information is not intended to replace advice given to you by your health care provider. Make sure you discuss any questions you have with your health care provider. Document Released: 07/26/2011 Document Revised: 05/27/2016 Document Reviewed: 04/22/2016 Elsevier Interactive Patient Education  2017 Elsevier Inc.   Morning Sickness Morning sickness is when you feel sick to your stomach (nauseous) during pregnancy. You may feel sick to your stomach and throw up (vomit). You may feel sick in the morning, but you can feel this way any time of day. Some women feel very sick to their stomach and cannot stop throwing up (hyperemesis gravidarum). Follow these instructions at home:  Only take medicines as told by your doctor.  Take multivitamins as told by  your doctor. Taking multivitamins before getting pregnant can stop or lessen the harshness of morning sickness.  Eat dry toast or unsalted crackers before getting out of bed.  Eat 5 to 6 small meals a day.  Eat dry and bland foods like rice and baked potatoes.  Do not drink liquids with meals. Drink between meals.  Do not eat greasy, fatty, or spicy foods.  Have someone cook for you if the smell of food causes you to feel sick or throw up.  If you feel sick to your stomach after taking prenatal vitamins, take them at night or with a  snack.  Eat protein when you need a snack (nuts, yogurt, cheese).  Eat unsweetened gelatins for dessert.  Wear a bracelet used for sea sickness (acupressure wristband).  Go to a doctor that puts thin needles into certain body points (acupuncture) to improve how you feel.  Do not smoke.  Use a humidifier to keep the air in your house free of odors.  Get lots of fresh air. Contact a doctor if:  You need medicine to feel better.  You feel dizzy or lightheaded.  You are losing weight. Get help right away if:  You feel very sick to your stomach and cannot stop throwing up.  You pass out (faint). This information is not intended to replace advice given to you by your health care provider. Make sure you discuss any questions you have with your health care provider. Document Released: 12/15/2004 Document Revised: 04/14/2016 Document Reviewed: 04/24/2013 Elsevier Interactive Patient Education  2017 [employer]

## 2017-04-05 NOTE — MAU Provider Note (Signed)
History     CSN: [id number]  Arrival date and time: 04/05/17 1501   First Provider Initiated Contact with Patient 04/05/17 1547      Chief Complaint  Patient presents with  . Nausea   HPI   [name] is a 36 y.o. female G3P1001 @ 68w5dhere in MAU with nausea and vomiting. Patient has had N/V throughout the pregnancy.   She has vomited 5 times in the last 24 hours. Says she attempted to eat hash browns this morning which came right back up.   Patient with a history of chronic HTN. Says she takes labetalol 200 mg BID. Today she took BP medication and vomited it up. She denies abdominal pain or vaginal bleeding.   OB History    Gravida Para Term Preterm AB Living   _0 SAB TAB Ectopic Multiple Live Births                  Past Medical History:  Diagnosis Date  . GERD (gastroesophageal reflux disease)   . History of palpitations    negative work-up by cardiologist-- dr nJohnsie Cancel- 2015  . Hypertension    currently pt stopped taking medication due to pregnancy  . MCTD (mixed connective tissue disease) ([facility]    dx 07/ 2015--  rheumotologist--  dr aGavin Pound . Nausea   . Wears contact lenses     Past Surgical History:  Procedure Laterality Date  . DILATION AND EVACUATION N/A 06/17/2016   Procedure: DILATATION AND EVACUATION;  Surgeon: MLinda Hedges DO;  [facility] [facility]  Service: Gynecology;  Laterality: N/A;  WTIH SUCTION  . WISDOM TOOTH EXTRACTION  2012    History reviewed. No pertinent family history.  Social History  Substance Use Topics  . Smoking status: Never Smoker  . Smokeless tobacco: Never Used  . Alcohol use Not on file     Comment: before pregnancy -- occasional    Allergies: No Known Allergies  Prescriptions Prior to Admission  Medication Sig Dispense Refill Last Dose  . labetalol (NORMODYNE) 100 MG tablet Take 1 tablet (100 mg total) by mouth 2 (two) times daily. 60 tablet 0 04/05/2017 at Unknown time  .  Doxylamine-Pyridoxine (DICLEGIS PO) Take 1-2 tablets by mouth 2 (two) times daily. Pt takes 1 tablet in the AM and 2 tablets at night.   03/07/2017 at Unknown time  . omeprazole (PRILOSEC) 40 MG capsule Take 1 tab before breakfast and dinner, twice daily. (Patient not taking: Reported on 03/07/2017) 60 capsule 3 Not Taking at Unknown time  . Prenatal Vit-Fe Fumarate-FA (MULTIVITAMIN-PRENATAL) 27-0.8 MG TABS tablet Take 1 tablet by mouth at bedtime.    03/06/2017 at Unknown time  . ranitidine (ZANTAC) 150 MG capsule Take 150 mg by mouth daily as needed for heartburn.    03/07/2017 at Unknown time  . valACYclovir (VALTREX) 500 MG tablet Take by mouth daily as needed. For cold sore flare-ups   Past Month at Unknown time   Results for orders placed or performed during the hospital encounter of 04/05/17 (from the past 48 hour(s))  Urinalysis, Routine w reflex microscopic     Status: Abnormal   Collection Time: 04/05/17  3:08 PM  Result Value Ref Range   Color, Urine YELLOW YELLOW   APPearance CLOUDY (A) CLEAR   Specific Gravity, Urine 1.023 1.005 - 1.030   pH 8.0 5.0 - 8.0   Glucose, UA NEGATIVE NEGATIVE mg/dL  Hgb urine dipstick NEGATIVE NEGATIVE   Bilirubin Urine NEGATIVE NEGATIVE   Ketones, ur NEGATIVE NEGATIVE mg/dL   Protein, ur 30 (A) NEGATIVE mg/dL   Nitrite NEGATIVE NEGATIVE   Leukocytes, UA NEGATIVE NEGATIVE   RBC / HPF 0-5 0 - 5 RBC/hpf   WBC, UA 0-5 0 - 5 WBC/hpf   Bacteria, UA RARE (A) NONE SEEN   Squamous Epithelial / LPF 0-5 (A) NONE SEEN   Mucous PRESENT   Comprehensive metabolic panel     Status: Abnormal   Collection Time: 04/05/17  3:55 PM  Result Value Ref Range   Sodium 136 135 - 145 mmol/L   Potassium 3.8 3.5 - 5.1 mmol/L   Chloride 106 101 - 111 mmol/L   CO2 25 22 - 32 mmol/L   Glucose, Bld 79 65 - 99 mg/dL   BUN 6 6 - 20 mg/dL   Creatinine, Ser 0.64 0.44 - 1.00 mg/dL   Calcium 9.3 8.9 - 10.3 mg/dL   Total Protein 7.2 6.5 - 8.1 g/dL   Albumin 3.5 3.5 - 5.0 g/dL    AST 32 15 - 41 U/L   ALT 38 14 - 54 U/L   Alkaline Phosphatase 57 38 - 126 U/L   Total Bilirubin 0.2 (L) 0.3 - 1.2 mg/dL   GFR calc non Af Amer >60 >60 mL/min   GFR calc Af Amer >60 >60 mL/min    Comment: (NOTE) The eGFR has been calculated using the CKD EPI equation. This calculation has not been validated in all clinical situations. eGFR's persistently <60 mL/min signify possible Chronic Kidney Disease.    Anion gap 5 5 - 15    Review of Systems  Constitutional: Negative for fever.  Eyes: Negative for photophobia and visual disturbance.  Gastrointestinal: Negative for diarrhea.  Neurological: Negative for headaches.   Physical Exam   Blood pressure (!) 150/99, pulse 79, temperature 99.2 F (37.3 C), temperature source Oral, resp. rate 18, last menstrual period 01/04/2016.    Patient Vitals for the past 24 hrs:  BP Temp Temp src Pulse Resp  04/05/17 1831 (!) 144/92 - - 79 -  04/05/17 1816 (!) 146/96 - - 76 -  04/05/17 1801 (!) 153/100 - - 75 -  04/05/17 1746 (!) 157/96 - - 73 -  04/05/17 1716 (!) 155/88 - - 81 -  04/05/17 1701 (!) 166/95 - - 84 -  04/05/17 1646 (!) 149/95 - - 73 -  04/05/17 1640 (!) 160/106 - - 78 -  04/05/17 1616 (!) 139/94 - - 75 -  04/05/17 1601 (!) 143/94 - - 76 -  04/05/17 1548 (!) 150/99 - - 79 -  04/05/17 1515 (!) 150/96 99.2 F (37.3 C) Oral 86 18  04/05/17 1513 (!) 146/103 - - - -   Physical Exam  Constitutional: She is oriented to person, place, and time. She appears well-developed and well-nourished. No distress.  HENT:  Head: Normocephalic.  Cardiovascular: Normal rate.   Respiratory: Effort normal. No respiratory distress. She has no wheezes.  Musculoskeletal: Normal range of motion.  Neurological: She is alert and oriented to person, place, and time.  Skin: Skin is warm. She is not diaphoretic.  Psychiatric: Her behavior is normal.   MAU Course  Procedures  None  MDM  MVI X 1 Pepcid 20 mg IV Reglan 10 mg IV Zofran 8 mg  IV + fetal heart tones via doppler  Labetalol 200 mg PO given 1729. Most recent BP 144/92 Discussed patient with Dr. Royston Sinner.  Assessment and Plan   A:  1. Nausea and vomiting in pregnancy   2. Maternal chronic hypertension in second trimester     P:  Discharge home in stable condition Rx: Zantac BID Follow up this week in the office for a BP check. Return to MAU if symptoms worsen  Continue Zofran at home as needed Recommend bland foods  Rasch, Artist Pais, NP 04/05/2017 8:19 PM

## 2017-05-02 ENCOUNTER — Other Ambulatory Visit: Payer: Self-pay | Admitting: Gastroenterology

## 2017-05-02 DIAGNOSIS — R110 Nausea: Secondary | ICD-10-CM

## 2017-05-10 ENCOUNTER — Ambulatory Visit
Admission: RE | Admit: 2017-05-10 | Discharge: 2017-05-10 | Disposition: A | Discharge status: Home / Self Care | Payer: 59 | Source: Ambulatory Visit | Attending: Gastroenterology | Admitting: Gastroenterology

## 2017-05-10 DIAGNOSIS — R110 Nausea: Secondary | ICD-10-CM

## 2017-05-18 ENCOUNTER — Encounter (HOSPITAL_COMMUNITY): Payer: Self-pay | Admitting: *Deleted

## 2017-05-18 ENCOUNTER — Inpatient Hospital Stay (HOSPITAL_COMMUNITY)
Admission: AD | Admit: 2017-05-18 | Discharge: 2017-05-18 | Disposition: A | Discharge status: Home / Self Care | Payer: 59 | Source: Ambulatory Visit | Attending: Obstetrics and Gynecology | Admitting: Obstetrics and Gynecology

## 2017-05-18 DIAGNOSIS — Z9889 Other specified postprocedural states: Secondary | ICD-10-CM | POA: Diagnosis not present

## 2017-05-18 DIAGNOSIS — M351 Other overlap syndromes: Secondary | ICD-10-CM | POA: Insufficient documentation

## 2017-05-18 DIAGNOSIS — O219 Vomiting of pregnancy, unspecified: Secondary | ICD-10-CM | POA: Diagnosis not present

## 2017-05-18 DIAGNOSIS — O162 Unspecified maternal hypertension, second trimester: Secondary | ICD-10-CM | POA: Diagnosis not present

## 2017-05-18 DIAGNOSIS — O99612 Diseases of the digestive system complicating pregnancy, second trimester: Secondary | ICD-10-CM | POA: Diagnosis not present

## 2017-05-18 DIAGNOSIS — Z3A19 19 weeks gestation of pregnancy: Secondary | ICD-10-CM

## 2017-05-18 DIAGNOSIS — Z79899 Other long term (current) drug therapy: Secondary | ICD-10-CM | POA: Diagnosis not present

## 2017-05-18 DIAGNOSIS — Z8249 Family history of ischemic heart disease and other diseases of the circulatory system: Secondary | ICD-10-CM | POA: Diagnosis not present

## 2017-05-18 LAB — URINALYSIS, ROUTINE W REFLEX MICROSCOPIC
Bilirubin Urine: NEGATIVE
GLUCOSE, UA: NEGATIVE mg/dL
Hgb urine dipstick: NEGATIVE
Ketones, ur: NEGATIVE mg/dL
Leukocytes, UA: NEGATIVE
NITRITE: NEGATIVE
PH: 6 (ref 5.0–8.0)
PROTEIN: 30 mg/dL — AB
Specific Gravity, Urine: 1.029 (ref 1.005–1.030)

## 2017-05-18 MED ORDER — PROMETHAZINE HCL 25 MG/ML IJ SOLN
25.0000 mg | Freq: Once | INTRAMUSCULAR | Status: AC
  Administered 2017-05-18: 25 mg via INTRAVENOUS
  Filled 2017-05-18: qty 1

## 2017-05-18 MED ORDER — METHYLPREDNISOLONE 4 MG PO TBPK: ORAL_TABLET | ORAL | 0 refills | Status: DC

## 2017-05-18 NOTE — Discharge Instructions (Signed)
Morning Sickness °Morning sickness is when you feel sick to your stomach (nauseous) during pregnancy. This nauseous feeling may or may not come with vomiting. It often occurs in the morning but can be a problem any time of day. Morning sickness is most common during the first trimester, but it may continue throughout pregnancy. While morning sickness is unpleasant, it is usually harmless unless you develop severe and continual vomiting (hyperemesis gravidarum). This condition requires more intense treatment. °What are the causes? °The cause of morning sickness is not completely known but seems to be related to normal hormonal changes that occur in pregnancy. °What increases the risk? °You are at greater risk if you: °· Experienced nausea or vomiting before your pregnancy. °· Had morning sickness during a previous pregnancy. °· Are pregnant with more than one baby, such as twins. ° °How is this treated? °Do not use any medicines (prescription, over-the-counter, or herbal) for morning sickness without first talking to your health care provider. Your health care provider may prescribe or recommend: °· Vitamin B6 supplements. °· Anti-nausea medicines. °· The herbal medicine ginger. ° °Follow these instructions at home: °· Only take over-the-counter or prescription medicines as directed by your health care provider. °· Taking multivitamins before getting pregnant can prevent or decrease the severity of morning sickness in most women. °· Eat a piece of dry toast or unsalted crackers before getting out of bed in the morning. °· Eat five or six small meals a day. °· Eat dry and bland foods (rice, baked potato). Foods high in carbohydrates are often helpful. °· Do not drink liquids with your meals. Drink liquids between meals. °· Avoid greasy, fatty, and spicy foods. °· Get someone to cook for you if the smell of any food causes nausea and vomiting. °· If you feel nauseous after taking prenatal vitamins, take the vitamins at  night or with a snack. °· Snack on protein foods (nuts, yogurt, cheese) between meals if you are hungry. °· Eat unsweetened gelatins for desserts. °· Wearing an acupressure wristband (worn for sea sickness) may be helpful. °· Acupuncture may be helpful. °· Do not smoke. °· Get a humidifier to keep the air in your house free of odors. °· Get plenty of fresh air. °Contact a health care provider if: °· Your home remedies are not working, and you need medicine. °· You feel dizzy or lightheaded. °· You are losing weight. °Get help right away if: °· You have persistent and uncontrolled nausea and vomiting. °· You pass out (faint). °This information is not intended to replace advice given to you by your health care provider. Make sure you discuss any questions you have with your health care provider. °Document Released: 12/29/2006 Document Revised: 04/14/2016 Document Reviewed: 04/24/2013 °Elsevier Interactive Patient Education © 2017 [employer] ° °

## 2017-05-18 NOTE — MAU Note (Signed)
Pt c/o vomiting that started this morning. Pt states she hasn't been able to keep anything down today. Pt states she has a lot of nausea medicine at home and has taken it but it is not helping. Pt denies pain, bleeding, and leaking.

## 2017-05-18 NOTE — MAU Provider Note (Signed)
History     CSN: [id number]  Arrival date and time: 05/18/17 [id number]   First Provider Initiated Contact with Patient 05/18/17 2050      Chief Complaint  Patient presents with  . Emesis   Emesis   This is a new problem. The current episode started today. The problem occurs more than 10 times per day. The problem has been unchanged. The emesis has an appearance of stomach contents. There has been no fever. Pertinent negatives include no chills, diarrhea or fever. Risk factors: pregnancy  Treatments tried: diclegis, zofran, phenergan, zantac, protonix, sea bands, teas, prayer.  The treatment provided no relief (Only time I feel better is after an IV for about 1-2 days. Then I come back. I have seen a "stomach specialist", and they couldn't help me. ).   Past Medical History:  Diagnosis Date  . GERD (gastroesophageal reflux disease)   . History of palpitations    negative work-up by cardiologist-- dr Eden Emmsnishan-- 2015  . Hypertension    currently pt stopped taking medication due to pregnancy  . MCTD (mixed connective tissue disease) [facility](H[facility])    dx 07/ 2015--  rheumotologist--  dr Zenovia Jordanangela hawkes  . Nausea   . Wears contact lenses     Past Surgical History:  Procedure Laterality Date  . DILATION AND EVACUATION N/A 06/17/2016   Procedure: DILATATION AND EVACUATION;  Surgeon: Mitchel HonourMegan Morris, DO;  [facility] ;  Service: Gynecology;  Laterality: N/A;  WTIH SUCTION  . WISDOM TOOTH EXTRACTION  2012    Family History  Problem Relation Age of Onset  . Hypertension Mother   . Hypertension Father   . Hypertension Maternal Grandmother     Social History  Substance Use Topics  . Smoking status: Never Smoker  . Smokeless tobacco: Never Used  . Alcohol use Not on file     Comment: before pregnancy -- occasional    Allergies: No Known Allergies  Prescriptions Prior to Admission  Medication Sig Dispense Refill Last Dose  . labetalol (NORMODYNE) 200 MG tablet    05/18/2017  at Unknown time  . oxymetazoline (AFRIN) 0.05 % nasal spray Place 1 spray into both nostrils 2 (two) times daily.   05/18/2017 at Unknown time  . pantoprazole (PROTONIX) 20 MG tablet Take 20 mg by mouth daily.   05/18/2017 at Unknown time  . promethazine (PHENERGAN) 25 MG tablet    05/17/2017 at Unknown time  . ranitidine (ZANTAC) 150 MG capsule Take 1 capsule (150 mg total) by mouth 2 (two) times daily. 60 capsule 1 05/18/2017 at Unknown time  . Doxylamine-Pyridoxine (DICLEGIS PO) Take 1-2 tablets by mouth 2 (two) times daily. Pt takes 1 tablet in the AM and 2 tablets at night.   Unknown at Unknown time  . Prenatal Vit-Fe Fumarate-FA (MULTIVITAMIN-PRENATAL) 27-0.8 MG TABS tablet Take 1 tablet by mouth at bedtime.    Unknown at Unknown time    Review of Systems  Constitutional: Negative for chills and fever.  Respiratory:       "I have had cold symptoms for about a week"  Gastrointestinal: Positive for nausea and vomiting. Negative for constipation and diarrhea.       "it is hard for me to swallow"  Genitourinary: Negative for dysuria, frequency, urgency, vaginal bleeding and vaginal discharge.   Physical Exam   Blood pressure (!) 130/97, pulse 88, temperature 99.2 F (37.3 C), temperature source Oral, resp. rate 18, height 5\' 6"  (1.676 m), weight 207 lb (93.9 kg), last  menstrual period 01/04/2016, SpO2 100 %.  Physical Exam  Nursing note and vitals reviewed. Constitutional: She is oriented to person, place, and time. She appears well-developed and well-nourished. No distress.  HENT:  Head: Normocephalic.  Cardiovascular: Normal rate.   Respiratory: Effort normal.  GI: Soft. There is no tenderness.  Neurological: She is alert and oriented to person, place, and time.  Skin: Skin is warm and dry.  Psychiatric: She has a normal mood and affect.   Results for orders placed or performed during the hospital encounter of 05/18/17 (from the past 24 hour(s))  Urinalysis, Routine w reflex  microscopic     Status: Abnormal   Collection Time: 05/18/17  7:15 PM  Result Value Ref Range   Color, Urine YELLOW YELLOW   APPearance HAZY (A) CLEAR   Specific Gravity, Urine 1.029 1.005 - 1.030   pH 6.0 5.0 - 8.0   Glucose, UA NEGATIVE NEGATIVE mg/dL   Hgb urine dipstick NEGATIVE NEGATIVE   Bilirubin Urine NEGATIVE NEGATIVE   Ketones, ur NEGATIVE NEGATIVE mg/dL   Protein, ur 30 (A) NEGATIVE mg/dL   Nitrite NEGATIVE NEGATIVE   Leukocytes, UA NEGATIVE NEGATIVE   RBC / HPF 0-5 0 - 5 RBC/hpf   WBC, UA 0-5 0 - 5 WBC/hpf   Bacteria, UA FEW (A) NONE SEEN   Squamous Epithelial / LPF 0-5 (A) NONE SEEN   Mucous PRESENT    Hyaline Casts, UA PRESENT     MAU Course  Procedures  MDM Patient has had L of D5LR with phenergan.  2246: Patient is tolerating PO at this time.   2258: D/W Dr. Vincente Poli, will DC home on Medrol dose pack. If she returns again consider admission for IV steroid protocol.  Assessment and Plan   1. Nausea/vomiting in pregnancy   2. [redacted] weeks gestation of pregnancy    DC home Comfort measures reviewed  2nd Trimester precautions  PTL precautions  Fetal kick counts RX: medrol dose pack as directed.  Return to MAU as needed FU with OB as planned  Follow-up Information    Marcelle Overlie, MD Follow up.   Specialty:  Obstetrics and Gynecology Contact information: [address] [place] [address] [phone]            Thressa Sheller 05/18/2017, 8:52 PM

## 2017-07-21 ENCOUNTER — Inpatient Hospital Stay (HOSPITAL_COMMUNITY)
Admission: AD | Admit: 2017-07-21 | Discharge: 2017-07-21 | Disposition: A | Discharge status: Home / Self Care | Payer: 59 | Source: Ambulatory Visit | Attending: Obstetrics and Gynecology | Admitting: Obstetrics and Gynecology

## 2017-07-21 ENCOUNTER — Encounter (HOSPITAL_COMMUNITY): Payer: Self-pay

## 2017-07-21 DIAGNOSIS — Z79899 Other long term (current) drug therapy: Secondary | ICD-10-CM | POA: Insufficient documentation

## 2017-07-21 DIAGNOSIS — Z3A29 29 weeks gestation of pregnancy: Secondary | ICD-10-CM | POA: Diagnosis not present

## 2017-07-21 DIAGNOSIS — O99613 Diseases of the digestive system complicating pregnancy, third trimester: Secondary | ICD-10-CM | POA: Diagnosis not present

## 2017-07-21 DIAGNOSIS — Z9889 Other specified postprocedural states: Secondary | ICD-10-CM | POA: Diagnosis not present

## 2017-07-21 DIAGNOSIS — O219 Vomiting of pregnancy, unspecified: Secondary | ICD-10-CM

## 2017-07-21 DIAGNOSIS — Z8249 Family history of ischemic heart disease and other diseases of the circulatory system: Secondary | ICD-10-CM | POA: Insufficient documentation

## 2017-07-21 DIAGNOSIS — K219 Gastro-esophageal reflux disease without esophagitis: Secondary | ICD-10-CM | POA: Diagnosis not present

## 2017-07-21 DIAGNOSIS — O10913 Unspecified pre-existing hypertension complicating pregnancy, third trimester: Secondary | ICD-10-CM

## 2017-07-21 LAB — PROTEIN / CREATININE RATIO, URINE
CREATININE, URINE: 390 mg/dL
Protein Creatinine Ratio: 0.08 mg/mg{Cre} (ref 0.00–0.15)
Total Protein, Urine: 32 mg/dL

## 2017-07-21 LAB — COMPREHENSIVE METABOLIC PANEL
ALBUMIN: 3 g/dL — AB (ref 3.5–5.0)
ALK PHOS: 180 U/L — AB (ref 38–126)
ALT: 12 U/L — AB (ref 14–54)
AST: 21 U/L (ref 15–41)
Anion gap: 8 (ref 5–15)
BUN: 5 mg/dL — AB (ref 6–20)
CHLORIDE: 106 mmol/L (ref 101–111)
CO2: 24 mmol/L (ref 22–32)
Calcium: 8.8 mg/dL — ABNORMAL LOW (ref 8.9–10.3)
Creatinine, Ser: 0.65 mg/dL (ref 0.44–1.00)
GFR calc Af Amer: >60 mL/min (ref >60)
GFR calc non Af Amer: >60 mL/min (ref >60)
GLUCOSE: 75 mg/dL (ref 65–99)
Potassium: 3.2 mmol/L — ABNORMAL LOW (ref 3.5–5.1)
SODIUM: 138 mmol/L (ref 135–145)
Total Bilirubin: 0.3 mg/dL (ref 0.3–1.2)
Total Protein: 6.8 g/dL (ref 6.5–8.1)

## 2017-07-21 LAB — URINALYSIS, ROUTINE W REFLEX MICROSCOPIC
Bilirubin Urine: NEGATIVE
GLUCOSE, UA: NEGATIVE mg/dL
HGB URINE DIPSTICK: NEGATIVE
Ketones, ur: NEGATIVE mg/dL
Leukocytes, UA: NEGATIVE
NITRITE: NEGATIVE
PH: 5 (ref 5.0–8.0)
PROTEIN: 30 mg/dL — AB
SPECIFIC GRAVITY, URINE: 1.028 (ref 1.005–1.030)

## 2017-07-21 LAB — LACTATE DEHYDROGENASE: LDH: 141 U/L (ref 98–192)

## 2017-07-21 LAB — CBC
HCT: 27.3 % — ABNORMAL LOW (ref 36.0–46.0)
HEMOGLOBIN: 9.3 g/dL — AB (ref 12.0–15.0)
MCH: 26.3 pg (ref 26.0–34.0)
MCHC: 34.1 g/dL (ref 30.0–36.0)
MCV: 77.1 fL — ABNORMAL LOW (ref 78.0–100.0)
PLATELETS: 295 10*3/uL (ref 150–400)
RBC: 3.54 MIL/uL — AB (ref 3.87–5.11)
RDW: 14 % (ref 11.5–15.5)
WBC: 5.9 10*3/uL (ref 4.0–10.5)

## 2017-07-21 LAB — URIC ACID: Uric Acid, Serum: 4.5 mg/dL (ref 2.3–6.6)

## 2017-07-21 MED ORDER — SODIUM CHLORIDE 0.9 % IV SOLN
8.0000 mg | Freq: Once | INTRAVENOUS | Status: AC
  Administered 2017-07-21: 8 mg via INTRAVENOUS
  Filled 2017-07-21: qty 4

## 2017-07-21 MED ORDER — FAMOTIDINE IN NACL 20-0.9 MG/50ML-% IV SOLN
20.0000 mg | Freq: Once | INTRAVENOUS | Status: AC
  Administered 2017-07-21: 20 mg via INTRAVENOUS
  Filled 2017-07-21: qty 50

## 2017-07-21 MED ORDER — LABETALOL HCL 100 MG PO TABS
200.0000 mg | ORAL_TABLET | Freq: Once | ORAL | Status: AC
  Administered 2017-07-21: 200 mg via ORAL
  Filled 2017-07-21: qty 2

## 2017-07-21 MED ORDER — LACTATED RINGERS IV BOLUS (SEPSIS)
1000.0000 mL | Freq: Once | INTRAVENOUS | Status: AC
  Administered 2017-07-21: 1000 mL via INTRAVENOUS

## 2017-07-21 NOTE — MAU Provider Note (Signed)
History     CSN: [id number]  Arrival date and time: 07/21/17 1003   First Provider Initiated Contact with Patient 07/21/17 1056      Chief Complaint  Patient presents with  . Emesis   HPI   [name] is a 36 y.o. female G3P1011 @ 31w0dhere in MAU with N/V. Says she has been struggling with these symptoms the whole pregnancy. Says she has vomited 10x  In the last 24 hours. Says she thru up her BP medication this morning when she tried to drink some fluids. Says she has medication at home for vomiting however "it does not work". Says she took 2 tabs of diclegis yesterday.   OB History    Gravida Para Term Preterm AB Living   _0 SAB TAB Ectopic Multiple Live Births   1       1      Past Medical History:  Diagnosis Date  . GERD (gastroesophageal reflux disease)   . History of palpitations    negative work-up by cardiologist-- dr nJohnsie Rojas- 2015  . Hypertension    currently pt stopped taking medication due to pregnancy  . MCTD (mixed connective tissue disease) ([facility]    dx 07/ 2015--  rheumotologist--  dr Michelle Rojas . Nausea   . Wears contact lenses     Past Surgical History:  Procedure Laterality Date  . DILATION AND EVACUATION N/A 06/17/2016   Procedure: DILATATION AND EVACUATION;  Surgeon: MLinda Hedges DO;  [facility]  Service: Gynecology;  Laterality: N/A;  WTIH SUCTION  . WISDOM TOOTH EXTRACTION  2012    Family History  Problem Relation Age of Onset  . Hypertension Mother   . Hypertension Father   . Hypertension Maternal Grandmother     Social History  Substance Use Topics  . Smoking status: Never Smoker  . Smokeless tobacco: Never Used  . Alcohol use Not on file     Comment: before pregnancy -- occasional    Allergies: No Known Allergies  Prescriptions Prior to Admission  Medication Sig Dispense Refill Last Dose  . Doxylamine-Pyridoxine (DICLEGIS PO) Take 1-2 tablets by mouth 3 (three) times daily.  Pt takes 1 tablet in the AM, 1 tablet at lunch, and 2 tablets at night.   07/20/2017 at Unknown time  . labetalol (NORMODYNE) 200 MG tablet Take 200 mg by mouth 2 (two) times daily.    07/20/2017 at 2000  . ondansetron (ZOFRAN-ODT) 8 MG disintegrating tablet Take 8 mg by mouth daily as needed for nausea or vomiting.    Past Week at Unknown time  . pantoprazole (PROTONIX) 20 MG tablet Take 20 mg by mouth daily.   07/20/2017 at Unknown time  . Prenatal Vit-Fe Fumarate-FA (MULTIVITAMIN-PRENATAL) 27-0.8 MG TABS tablet Take 1 tablet by mouth at bedtime.    07/20/2017 at Unknown time  . promethazine (PHENERGAN) 25 MG tablet Take 25 mg by mouth every 6 (six) hours as needed for nausea or vomiting.    Past Week at Unknown time  . ranitidine (ZANTAC) 150 MG capsule Take 1 capsule (150 mg total) by mouth 2 (two) times daily. 60 capsule 1 07/20/2017 at Unknown time  . methylPREDNISolone (MEDROL DOSEPAK) 4 MG TBPK tablet As directed (Patient not taking: Reported on 07/21/2017) 1 tablet 0 Not Taking at Unknown time   Results for orders placed or performed during the hospital encounter of 07/21/17 (from the past 48 hour(s))  Urinalysis,  Routine w reflex microscopic     Status: Abnormal   Collection Time: 07/21/17 10:10 AM  Result Value Ref Range   Color, Urine YELLOW YELLOW   APPearance HAZY (A) CLEAR   Specific Gravity, Urine 1.028 1.005 - 1.030   pH 5.0 5.0 - 8.0   Glucose, UA NEGATIVE NEGATIVE mg/dL   Hgb urine dipstick NEGATIVE NEGATIVE   Bilirubin Urine NEGATIVE NEGATIVE   Ketones, ur NEGATIVE NEGATIVE mg/dL   Protein, ur 30 (A) NEGATIVE mg/dL   Nitrite NEGATIVE NEGATIVE   Leukocytes, UA NEGATIVE NEGATIVE   RBC / HPF 0-5 0 - 5 RBC/hpf   WBC, UA 0-5 0 - 5 WBC/hpf   Bacteria, UA RARE (A) NONE SEEN   Squamous Epithelial / LPF 0-5 (A) NONE SEEN   Mucus PRESENT   Protein / creatinine ratio, urine     Status: None   Collection Time: 07/21/17 10:10 AM  Result Value Ref Range   Creatinine, Urine 390.00  mg/dL   Total Protein, Urine 32 mg/dL    Comment: NO NORMAL RANGE ESTABLISHED FOR THIS TEST   Protein Creatinine Ratio 0.08 0.00 - 0.15 mg/mg[Cre]  CBC     Status: Abnormal   Collection Time: 07/21/17 11:07 AM  Result Value Ref Range   WBC 5.9 4.0 - 10.5 K/uL   RBC 3.54 (L) 3.87 - 5.11 MIL/uL   Hemoglobin 9.3 (L) 12.0 - 15.0 g/dL   HCT 27.3 (L) 36.0 - 46.0 %   MCV 77.1 (L) 78.0 - 100.0 fL   MCH 26.3 26.0 - 34.0 pg   MCHC 34.1 30.0 - 36.0 g/dL   RDW 14.0 11.5 - 15.5 %   Platelets 295 150 - 400 K/uL  Comprehensive metabolic panel     Status: Abnormal   Collection Time: 07/21/17 11:07 AM  Result Value Ref Range   Sodium 138 135 - 145 mmol/L   Potassium 3.2 (L) 3.5 - 5.1 mmol/L   Chloride 106 101 - 111 mmol/L   CO2 24 22 - 32 mmol/L   Glucose, Bld 75 65 - 99 mg/dL   BUN 5 (L) 6 - 20 mg/dL   Creatinine, Ser 0.65 0.44 - 1.00 mg/dL   Calcium 8.8 (L) 8.9 - 10.3 mg/dL   Total Protein 6.8 6.5 - 8.1 g/dL   Albumin 3.0 (L) 3.5 - 5.0 g/dL   AST 21 15 - 41 U/L   ALT 12 (L) 14 - 54 U/L   Alkaline Phosphatase 180 (H) 38 - 126 U/L   Total Bilirubin 0.3 0.3 - 1.2 mg/dL   GFR calc non Af Amer >60 >60 mL/min   GFR calc Af Amer >60 >60 mL/min    Comment: (NOTE) The eGFR has been calculated using the CKD EPI equation. This calculation has not been validated in all clinical situations. eGFR's persistently <60 mL/min signify possible Chronic Kidney Disease.    Anion gap 8 5 - 15  Uric acid     Status: None   Collection Time: 07/21/17 11:07 AM  Result Value Ref Range   Uric Acid, Serum 4.5 2.3 - 6.6 mg/dL  Lactate dehydrogenase     Status: None   Collection Time: 07/21/17 11:07 AM  Result Value Ref Range   LDH 141 98 - 192 U/L    Review of Systems  Eyes: Negative for photophobia and visual disturbance.  Gastrointestinal: Positive for nausea and vomiting. Negative for diarrhea.  Neurological: Negative for dizziness and headaches.   Physical Exam   Blood pressure Marland Kitchen)  137/98, pulse 84,  temperature 98.7 F (37.1 C), temperature source Oral, resp. rate 16, height _0  (1.676 m), weight 205 lb (93 kg), last menstrual period 01/04/2016, SpO2 100 %.   Patient Vitals for the past 24 hrs:  BP Temp Temp src Pulse Resp SpO2 Height Weight  07/21/17 1301 130/86 - - 83 - - - -  07/21/17 1246 (!) 139/91 - - 78 - - - -  07/21/17 1231 (!) 144/93 - - 78 - - - -  07/21/17 1216 (!) 146/88 - - 84 - - - -  07/21/17 1201 (!) 153/91 - - 83 - - - -  07/21/17 1146 (!) 146/86 - - 83 - - - -  07/21/17 1131 (!) 149/90 - - 86 - - - -  07/21/17 1118 (!) 147/101 - - 79 - - - -  07/21/17 1105 (!) 145/91 - - 85 - - - -  07/21/17 1041 (!) 137/98 - - 84 - - - -  07/21/17 1013 (!) 149/97 98.7 F (37.1 C) Oral 86 16 100 % _1  (1.676 m) 205 lb (93 kg)   Physical Exam  Constitutional: She is oriented to person, place, and time. She appears well-developed and well-nourished. No distress.  HENT:  Head: Normocephalic.  Respiratory: Effort normal.  GI: Soft. She exhibits no distension. There is no tenderness. There is no rebound.  Musculoskeletal: Normal range of motion.  Neurological: She is alert and oriented to person, place, and time. She has normal reflexes. She displays normal reflexes.  Negative clonus   Skin: Skin is warm. She is not diaphoretic.  Psychiatric: Her behavior is normal.   Fetal Tracing: Baseline: 150 bpm Variability: moderate  Accelerations: 15x15 Decelerations: None Toco: None  MAU Course  Procedures  None   MDM  PIH labs ordered Zofran 8 mg IV  Pepcid 20 mg IV LR bolus X 1 Labetalol 200 mg PO given, patient tolerated it well without vomiting.  Discussed patient with Dr. Helane Rima: ok for dc home   Assessment and Plan   A:  1. Preexisting hypertension complicating pregnancy, antepartum, third trimester   2. Nausea and vomiting in pregnancy     P:  Discharge home in stable condition Patient has RX for Zofran and Phenergan at home. Encouraged to use if  needed Increase Diglegis dose until she reaches max dose Return to MAU if symptoms worsen BRAT diet encouraged  Michelle Rojas, Artist Pais, NP 07/21/2017 2:26 PM

## 2017-07-21 NOTE — MAU Note (Signed)
Pt reports she has been vomiting the entire pregnancy. States she has vomited about 10 times in the last 24 hours. Denies fever, diarrhea or other symptoms.

## 2017-07-21 NOTE — Discharge Instructions (Signed)
Hypertension During Pregnancy °Hypertension is also called high blood pressure. High blood pressure means that the force of your blood moving in your body is too strong. When you are pregnant, this condition should be watched carefully. It can cause problems for you and your baby. °Follow these instructions at home: °Eating and drinking °· Drink enough fluid to keep your pee (urine) clear or pale yellow. °· Eat healthy foods that are low in salt (sodium). °? Do not add salt to your food. °? Check labels on foods and drinks to see much salt is in them. Look on the label where you see "Sodium." °Lifestyle °· Do not use any products that contain nicotine or tobacco, such as cigarettes and e-cigarettes. If you need help quitting, ask your doctor. °· Do not use alcohol. °· Avoid caffeine. °· Avoid stress. Rest and get plenty of sleep. °General instructions °· Take over-the-counter and prescription medicines only as told by your doctor. °· While lying down, lie on your left side. This keeps pressure off your baby. °· While sitting or lying down, raise (elevate) your feet. Try putting some pillows under your lower legs. °· Exercise regularly. Ask your doctor what kinds of exercise are best for you. °· Keep all prenatal and follow-up visits as told by your doctor. This is important. °Contact a doctor if: °· You have symptoms that your doctor told you to watch for, such as: °? Fever. °? Throwing up (vomiting). °? Headache. °Get help right away if: °· You have very bad pain in your belly (abdomen). °· You are throwing up, and this does not get better with treatment. °· You suddenly get swelling in your hands, ankles, or face. °· You gain 4 lb (1.8 kg) or more in 1 week. °· You get bleeding from your vagina. °· You have blood in your pee. °· You do not feel your baby moving as much as normal. °· You have a change in vision. °· You have muscle twitching or sudden tightening (spasms). °· You have trouble breathing. °· Your lips  or fingernails turn blue. °This information is not intended to replace advice given to you by your health care provider. Make sure you discuss any questions you have with your health care provider. °Document Released: 12/10/2010 Document Revised: 07/19/2016 Document Reviewed: 07/19/2016 °Elsevier Interactive Patient Education © 2017 [employer] °Preeclampsia and Eclampsia °Preeclampsia is a serious condition that develops only during pregnancy. It is also called toxemia of pregnancy. This condition causes high blood pressure along with other symptoms, such as swelling and headaches. These symptoms may develop as the condition gets worse. Preeclampsia may occur at 20 weeks of pregnancy or later. °Diagnosing and treating preeclampsia early is very important. If not treated early, it can cause serious problems for you and your baby. One problem it can lead to is eclampsia, which is a condition that causes muscle jerking or shaking (convulsions or seizures) in the mother. Delivering your baby is the best treatment for preeclampsia or eclampsia. Preeclampsia and eclampsia symptoms usually go away after your baby is born. °What are the causes? °The cause of preeclampsia is not known. °What increases the risk? °The following risk factors make you more likely to develop preeclampsia: °· Being pregnant for the first time. °· Having had preeclampsia during a past pregnancy. °· Having a family history of preeclampsia. °· Having high blood pressure. °· Being pregnant with twins or triplets. °· Being 35 or older. °· Being African-American. °· Having kidney disease or diabetes. °·   Having medical conditions such as lupus or blood diseases. °· Being very overweight (obese). ° °What are the signs or symptoms? °The earliest signs of preeclampsia are: °· High blood pressure. °· Increased protein in your urine. Your health care provider will check for this at every visit before you give birth (prenatal visit). ° °Other symptoms that  may develop as the condition gets worse include: °· Severe headaches. °· Sudden weight gain. °· Swelling of the hands, face, legs, and feet. °· Nausea and vomiting. °· Vision problems, such as blurred or double vision. °· Numbness in the face, arms, legs, and feet. °· Urinating less than usual. °· Dizziness. °· Slurred speech. °· Abdominal pain, especially upper abdominal pain. °· Convulsions or seizures. ° °Symptoms generally go away after giving birth. °How is this diagnosed? °There are no screening tests for preeclampsia. Your health care provider will ask you about symptoms and check for signs of preeclampsia during your prenatal visits. You may also have tests that include: °· Urine tests. °· Blood tests. °· Checking your blood pressure. °· Monitoring your baby’s heart rate. °· Ultrasound. ° °How is this treated? °You and your health care provider will determine the treatment approach that is best for you. Treatment may include: °· Having more frequent prenatal exams to check for signs of preeclampsia, if you have an increased risk for preeclampsia. °· Bed rest. °· Reducing how much salt (sodium) you eat. °· Medicine to lower your blood pressure. °· Staying in the hospital, if your condition is severe. There, treatment will focus on controlling your blood pressure and the amount of fluids in your body (fluid retention). °· You may need to take medicine (magnesium sulfate) to prevent seizures. This medicine may be given as an injection or through an IV tube. °· Delivering your baby early, if your condition gets worse. You may have your labor started with medicine (induced), or you may have a cesarean delivery. ° °Follow these instructions at home: °Eating and drinking ° °· Drink enough fluid to keep your urine clear or pale yellow. °· Eat a healthy diet that is low in sodium. Do not add salt to your food. Check nutrition labels to see how much sodium a food or beverage contains. °· Avoid  caffeine. °Lifestyle °· Do not use any products that contain nicotine or tobacco, such as cigarettes and e-cigarettes. If you need help quitting, ask your health care provider. °· Do not use alcohol or drugs. °· Avoid stress as much as possible. Rest and get plenty of sleep. °General instructions °· Take over-the-counter and prescription medicines only as told by your health care provider. °· When lying down, lie on your side. This keeps pressure off of your baby. °· When sitting or lying down, raise (elevate) your feet. Try putting some pillows underneath your lower legs. °· Exercise regularly. Ask your health care provider what kinds of exercise are best for you. °· Keep all follow-up and prenatal visits as told by your health care provider. This is important. °How is this prevented? °To prevent preeclampsia or eclampsia from developing during another pregnancy: °· Get proper medical care during pregnancy. Your health care provider may be able to prevent preeclampsia or diagnose and treat it early. °· Your health care provider may have you take a low-dose aspirin or a calcium supplement during your next pregnancy. °· You may have tests of your blood pressure and kidney function after giving birth. °· Maintain a healthy weight. Ask your health care provider for   help managing weight gain during pregnancy. °· Work with your health care provider to manage any long-term (chronic) health conditions you have, such as diabetes or kidney problems. ° °Contact a health care provider if: °· You gain more weight than expected. °· You have headaches. °· You have nausea or vomiting. °· You have abdominal pain. °· You feel dizzy or light-headed. °Get help right away if: °· You develop sudden or severe swelling anywhere in your body. This usually happens in the legs. °· You gain 5 lbs (2.3 kg) or more during one week. °· You have severe: °? Abdominal pain. °? Headaches. °? Dizziness. °? Vision problems. °? Confusion. °? Nausea or  vomiting. °· You have a seizure. °· You have trouble moving any part of your body. °· You develop numbness in any part of your body. °· You have trouble speaking. °· You have any abnormal bleeding. °· You pass out. °This information is not intended to replace advice given to you by your health care provider. Make sure you discuss any questions you have with your health care provider. °Document Released: 11/04/2000 Document Revised: 07/05/2016 Document Reviewed: 06/13/2016 °Elsevier Interactive Patient Education © 2018 [employer] Inc. ° °

## 2017-08-01 ENCOUNTER — Inpatient Hospital Stay (HOSPITAL_COMMUNITY)
Admission: AD | Admit: 2017-08-01 | Discharge: 2017-08-01 | Disposition: A | Discharge status: Home / Self Care | Payer: 59 | Source: Ambulatory Visit | Attending: Obstetrics and Gynecology | Admitting: Obstetrics and Gynecology

## 2017-08-01 DIAGNOSIS — O99012 Anemia complicating pregnancy, second trimester: Secondary | ICD-10-CM | POA: Insufficient documentation

## 2017-08-01 DIAGNOSIS — Z3A00 Weeks of gestation of pregnancy not specified: Secondary | ICD-10-CM | POA: Insufficient documentation

## 2017-08-01 MED ORDER — SODIUM CHLORIDE 0.9 % IV SOLN
510.0000 mg | Freq: Once | INTRAVENOUS | Status: AC
  Administered 2017-08-01: 510 mg via INTRAVENOUS
  Filled 2017-08-01: qty 17

## 2017-08-01 MED ORDER — SODIUM CHLORIDE 0.9 % IV SOLN
INTRAVENOUS | Status: DC
  Administered 2017-08-01: 100 mL via INTRAVENOUS

## 2017-08-01 NOTE — MAU Note (Signed)
IV iron infusion completed, pt. Monitored for 30-45 minutes after infusion, IV discontinued, fetal tracing reactive, Cat. 1 tracing, no contractions noted. Pt. Discharged to home without any complaints.

## 2017-08-01 NOTE — MAU Note (Signed)
Pt. Here for first iron infusion. Pt. Is on  Labetalol. Pt. States she took her medicine this morning but it did not stay down (BP 146/97 - instructed patient to follow up with her health care provider.) Positive for fetal movement, denies vaginal bleeding, also denies sudden gush of fluid. EFM applied - FHR 160s, Toco applied - no ctx palpated.

## 2017-08-01 NOTE — MAU Note (Signed)
Pt. Stable, SF position, fetal tracing reactive, no contractions noted. Iron infusion in progress.

## 2017-09-02 ENCOUNTER — Encounter (HOSPITAL_COMMUNITY): Payer: Self-pay

## 2017-09-02 ENCOUNTER — Inpatient Hospital Stay (HOSPITAL_COMMUNITY)
Admission: AD | Admit: 2017-09-02 | Discharge: 2017-09-02 | Disposition: A | Discharge status: Home / Self Care | Payer: 59 | Source: Ambulatory Visit | Attending: Obstetrics and Gynecology | Admitting: Obstetrics and Gynecology

## 2017-09-02 DIAGNOSIS — E860 Dehydration: Secondary | ICD-10-CM | POA: Diagnosis not present

## 2017-09-02 DIAGNOSIS — R319 Hematuria, unspecified: Secondary | ICD-10-CM | POA: Diagnosis not present

## 2017-09-02 DIAGNOSIS — O26893 Other specified pregnancy related conditions, third trimester: Secondary | ICD-10-CM | POA: Diagnosis not present

## 2017-09-02 DIAGNOSIS — O219 Vomiting of pregnancy, unspecified: Secondary | ICD-10-CM

## 2017-09-02 DIAGNOSIS — Z3A35 35 weeks gestation of pregnancy: Secondary | ICD-10-CM | POA: Diagnosis not present

## 2017-09-02 DIAGNOSIS — O99613 Diseases of the digestive system complicating pregnancy, third trimester: Secondary | ICD-10-CM | POA: Insufficient documentation

## 2017-09-02 DIAGNOSIS — O99283 Endocrine, nutritional and metabolic diseases complicating pregnancy, third trimester: Secondary | ICD-10-CM | POA: Insufficient documentation

## 2017-09-02 DIAGNOSIS — O163 Unspecified maternal hypertension, third trimester: Secondary | ICD-10-CM | POA: Insufficient documentation

## 2017-09-02 DIAGNOSIS — R109 Unspecified abdominal pain: Secondary | ICD-10-CM | POA: Diagnosis not present

## 2017-09-02 DIAGNOSIS — O10913 Unspecified pre-existing hypertension complicating pregnancy, third trimester: Secondary | ICD-10-CM | POA: Diagnosis not present

## 2017-09-02 DIAGNOSIS — E876 Hypokalemia: Secondary | ICD-10-CM | POA: Diagnosis not present

## 2017-09-02 DIAGNOSIS — Z79899 Other long term (current) drug therapy: Secondary | ICD-10-CM | POA: Insufficient documentation

## 2017-09-02 DIAGNOSIS — K219 Gastro-esophageal reflux disease without esophagitis: Secondary | ICD-10-CM | POA: Diagnosis not present

## 2017-09-02 LAB — URINALYSIS, ROUTINE W REFLEX MICROSCOPIC
Bilirubin Urine: NEGATIVE
GLUCOSE, UA: NEGATIVE mg/dL
Ketones, ur: 80 mg/dL — AB
LEUKOCYTES UA: NEGATIVE
NITRITE: NEGATIVE
PH: 5 (ref 5.0–8.0)
Protein, ur: 100 mg/dL — AB
SPECIFIC GRAVITY, URINE: 1.026 (ref 1.005–1.030)

## 2017-09-02 LAB — CBC
HEMATOCRIT: 31.7 % — AB (ref 36.0–46.0)
HEMOGLOBIN: 10.3 g/dL — AB (ref 12.0–15.0)
MCH: 25.6 pg — AB (ref 26.0–34.0)
MCHC: 32.5 g/dL (ref 30.0–36.0)
MCV: 78.7 fL (ref 78.0–100.0)
Platelets: 259 10*3/uL (ref 150–400)
RBC: 4.03 MIL/uL (ref 3.87–5.11)
RDW: 16.1 % — ABNORMAL HIGH (ref 11.5–15.5)
WBC: 5.4 10*3/uL (ref 4.0–10.5)

## 2017-09-02 LAB — COMPREHENSIVE METABOLIC PANEL
ALT: 17 U/L (ref 14–54)
ANION GAP: 9 (ref 5–15)
AST: 28 U/L (ref 15–41)
Albumin: 3.2 g/dL — ABNORMAL LOW (ref 3.5–5.0)
Alkaline Phosphatase: 427 U/L — ABNORMAL HIGH (ref 38–126)
BILIRUBIN TOTAL: 0.7 mg/dL (ref 0.3–1.2)
BUN: <5 mg/dL — ABNORMAL LOW (ref 6–20)
CALCIUM: 9.2 mg/dL (ref 8.9–10.3)
CO2: 24 mmol/L (ref 22–32)
CREATININE: 0.64 mg/dL (ref 0.44–1.00)
Chloride: 107 mmol/L (ref 101–111)
GFR calc non Af Amer: >60 mL/min (ref >60)
GLUCOSE: 73 mg/dL (ref 65–99)
Potassium: 3.1 mmol/L — ABNORMAL LOW (ref 3.5–5.1)
Sodium: 140 mmol/L (ref 135–145)
TOTAL PROTEIN: 6.9 g/dL (ref 6.5–8.1)

## 2017-09-02 LAB — PROTEIN / CREATININE RATIO, URINE
Creatinine, Urine: 496 mg/dL
PROTEIN CREATININE RATIO: 0.07 mg/mg{creat} (ref 0.00–0.15)
TOTAL PROTEIN, URINE: 36 mg/dL

## 2017-09-02 MED ORDER — LABETALOL HCL 100 MG PO TABS
200.0000 mg | ORAL_TABLET | Freq: Once | ORAL | Status: AC
  Administered 2017-09-02: 200 mg via ORAL
  Filled 2017-09-02: qty 2

## 2017-09-02 MED ORDER — PROMETHAZINE HCL 25 MG/ML IJ SOLN
25.0000 mg | INTRAVENOUS | Status: DC
  Administered 2017-09-02: 25 mg via INTRAVENOUS
  Filled 2017-09-02: qty 1

## 2017-09-02 NOTE — MAU Provider Note (Signed)
History     CSN: [id number]  Arrival date and time: 09/02/17 1533   First Provider Initiated Contact with Patient 09/02/17 1604      Chief Complaint  Patient presents with  . Nausea  . Emesis   Michelle Rojas is a 36 y.o. G3P1011 at [redacted]w[redacted]d presenting for exacerbation of her nausea and vomiting of pregnancy. She's had vomiting throughout the pregnancy and states she has retained no fluids or solids for 3 days. Feels lightheaded,  intermittently dizzy and hungry. States urine is very dark. Denies fever or sick contacts. Denies dysuria, urgency, frequency of urination. She has chronic hypertension and takes labetalol 200 mg bid. She denies headache but had visual disturbance of blurriness 2 hours ago while driving. She's had mild lower abdominal cramping intermittently but no contractions or upper abdominal discomfort.     OB History  Gravida Para Term Preterm AB Living  SAB TAB Ectopic Multiple Live Births  1       1    # Outcome Date GA Lbr Len/2nd Weight Sex Delivery Anes PTL Lv  3 Current           2 SAB           1 Term      Vag-Spont          Past Medical History:  Diagnosis Date  . GERD (gastroesophageal reflux disease)   . History of palpitations    negative work-up by cardiologist-- dr Eden Emms-- 2015  . Hypertension    currently pt stopped taking medication due to pregnancy  . MCTD (mixed connective tissue disease) [facility])    dx 07/ 2015--  rheumotologist--  dr Zenovia Jordan  . Nausea   . Wears contact lenses     Past Surgical History:  Procedure Laterality Date  . DILATION AND EVACUATION N/A 06/17/2016   Procedure: DILATATION AND EVACUATION;  Surgeon: [name], DO;  Location: [facility] [place];  Service: Gynecology;  Laterality: N/A;  WTIH SUCTION  . WISDOM TOOTH EXTRACTION  2012    Family History  Problem Relation Age of Onset  . Hypertension Mother   . Hypertension Father   . Hypertension Maternal Grandmother     Social  History  Substance Use Topics  . Smoking status: Never Smoker  . Smokeless tobacco: Never Used  . Alcohol use Not on file     Comment: before pregnancy -- occasional    Allergies: No Known Allergies  Prescriptions Prior to Admission  Medication Sig Dispense Refill Last Dose  . Doxylamine-Pyridoxine (DICLEGIS PO) Take 1-2 tablets by mouth 3 (three) times daily. Pt takes 1 tablet in the AM, 1 tablet at lunch, and 2 tablets at night.   Past Week at Unknown time  . labetalol (NORMODYNE) 200 MG tablet Take 200 mg by mouth 2 (two) times daily.    08/01/2017 at 0800  . ondansetron (ZOFRAN-ODT) 8 MG disintegrating tablet Take 8 mg by mouth daily as needed for nausea or vomiting.    Past Month at Unknown time  . pantoprazole (PROTONIX) 20 MG tablet Take 20 mg by mouth daily.   07/31/2017 at Unknown time  . Prenatal Vit-Fe Fumarate-FA (MULTIVITAMIN-PRENATAL) 27-0.8 MG TABS tablet Take 1 tablet by mouth at bedtime.    Past Week at Unknown time  . promethazine (PHENERGAN) 25 MG tablet Take 25 mg by mouth every 6 (six) hours as needed for nausea or vomiting.    Past Week  at Unknown time  . ranitidine (ZANTAC) 150 MG capsule Take 1 capsule (150 mg total) by mouth 2 (two) times daily. 60 capsule 1 07/31/2017 at Unknown time    Review of Systems  Constitutional: Positive for fatigue. Negative for fever.  HENT: Negative for congestion.   Gastrointestinal: Positive for abdominal pain, nausea and vomiting. Negative for constipation and diarrhea.       Intermittent lower abdominal cramping pain  Genitourinary: Negative for dysuria, flank pain, frequency, urgency, vaginal bleeding and vaginal discharge.  Musculoskeletal: Negative for back pain.  Neurological: Positive for dizziness and light-headedness.   Physical Exam   Blood pressure (!) 135/97, pulse 90, temperature 98.5 F (36.9 C), temperature source Oral, resp. rate 18, height  (1.676 m), weight 200 lb (90.7 kg), last menstrual period  01/04/2016.  Patient Vitals for the past 24 hrs:  BP Temp Temp src Pulse Resp Height Weight  09/02/17 1830 (!) 142/98 - - 92 - - -  09/02/17 1815 (!) 161/92 - - 82 - - -  09/02/17 1800 (!) 154/94 - - 91 - - -  09/02/17 1745 (!) 147/92 - - 89 - - -  09/02/17 1730 (!) 145/96 - - 85 - - -  09/02/17 1727 (!) 148/92 - - 86 - - -  09/02/17 1721 (!) 156/110 - - 99 - - -  09/02/17 1700 (!) 156/85 - - 89 - - -  09/02/17 1645 (!) 152/98 - - 84 - - -  09/02/17 1630 (!) 142/101 - - 87 - - -  09/02/17 1615 (!) 154/99 - - 89 - - -  09/02/17 1603 (!) 157/95 - - 88 - - -  09/02/17 1549 (!) 135/97 98.5 F (36.9 C) Oral 90 18  (1.676 m) 200 lb (90.7 kg)   Physical Exam  Nursing note and vitals reviewed. Constitutional: She is oriented to person, place, and time. She appears well-developed and well-nourished. No distress.  Looks fatigued  HENT:  Head: Normocephalic.  Eyes: No scleral icterus.  Neck: Neck supple.  Cardiovascular: Normal rate.   Respiratory: Effort normal.  GI: Soft. There is no tenderness. There is no guarding.  Musculoskeletal: She exhibits edema.  Trace dependent edema  Neurological: She is alert and oriented to person, place, and time. She has normal reflexes.  Skin: Skin is warm and dry.  Psychiatric: She has a normal mood and affect. Her behavior is normal.    MAU Course  Procedures Results for orders placed or performed during the hospital encounter of 09/02/17 (from the past 24 hour(s))  CBC     Status: Abnormal   Collection Time: 09/02/17  4:18 PM  Result Value Ref Range   WBC 5.4 4.0 - 10.5 K/uL   RBC 4.03 3.87 - 5.11 MIL/uL   Hemoglobin 10.3 (L) 12.0 - 15.0 g/dL   HCT 16.1 (L) 09.6 - 04.5 %   MCV 78.7 78.0 - 100.0 fL   MCH 25.6 (L) 26.0 - 34.0 pg   MCHC 32.5 30.0 - 36.0 g/dL   RDW 40.9 (H) 81.1 - 91.4 %   Platelets 259 150 - 400 K/uL  Comprehensive metabolic panel     Status: Abnormal   Collection Time: 09/02/17  4:18 PM  Result Value Ref Range    Sodium 140 135 - 145 mmol/L   Potassium 3.1 (L) 3.5 - 5.1 mmol/L   Chloride 107 101 - 111 mmol/L   CO2 24 22 - 32 mmol/L   Glucose, Bld 73 65 -  99 mg/dL   BUN <5 (L) 6 - 20 mg/dL   Creatinine, Ser 1.61 0.44 - 1.00 mg/dL   Calcium 9.2 8.9 - 09.6 mg/dL   Total Protein 6.9 6.5 - 8.1 g/dL   Albumin 3.2 (L) 3.5 - 5.0 g/dL   AST 28 15 - 41 U/L   ALT 17 14 - 54 U/L   Alkaline Phosphatase 427 (H) 38 - 126 U/L   Total Bilirubin 0.7 0.3 - 1.2 mg/dL   GFR calc non Af Amer >60 >60 mL/min   GFR calc Af Amer >60 >60 mL/min   Anion gap 9 5 - 15  Urinalysis, Routine w reflex microscopic     Status: Abnormal   Collection Time: 09/02/17  5:10 PM  Result Value Ref Range   Color, Urine AMBER (A) YELLOW   APPearance HAZY (A) CLEAR   Specific Gravity, Urine 1.026 1.005 - 1.030   pH 5.0 5.0 - 8.0   Glucose, UA NEGATIVE NEGATIVE mg/dL   Hgb urine dipstick SMALL (A) NEGATIVE   Bilirubin Urine NEGATIVE NEGATIVE   Ketones, ur 80 (A) NEGATIVE mg/dL   Protein, ur 045 (A) NEGATIVE mg/dL   Nitrite NEGATIVE NEGATIVE   Leukocytes, UA NEGATIVE NEGATIVE   RBC / HPF TOO NUMEROUS TO COUNT 0 - 5 RBC/hpf   WBC, UA 0-5 0 - 5 WBC/hpf   Bacteria, UA RARE (A) NONE SEEN   Squamous Epithelial / LPF 0-5 (A) NONE SEEN   Mucus PRESENT   Protein / creatinine ratio, urine     Status: None   Collection Time: 09/02/17  5:10 PM  Result Value Ref Range   Creatinine, Urine 496.00 mg/dL   Total Protein, Urine 36 mg/dL   Protein Creatinine Ratio 0.07 0.00 - 0.15 mg/mg[Cre]  Urine culture sent  Fetal monitoring Fetal heart rate baseline 145-150, moderate variability, accelerations present, no decelerations Toco: slight UI  Unable to void enough quantity for urinalysis prior to IV fluids being given (Urine specimen collected after 500cc IV fluids infused)  IV LR 1000 with Phenergan > tolerating po fluids and solids; no vomiting while in MAU. Feels much better after IV infused. Labetalol 200 mg po given at  1740  MDM Blood pressure elevations may be due to vomiting labetalol tablets today. Must rule out superimposed preeclampsia.  Consult with Dr. Elon Spanner Continue same Labetalol dosages and F/U for BP check in office on 10/15 Potassium rich food list given  Assessment and Plan  36yo G3P1011 at [redacted]w[redacted]d FWB by EFM 1. Nausea and vomiting of pregnancy, antepartum   2. Maternal chronic hypertension in third trimester   3. Dehydration   4. Hypokalemia   5. Hematuria, unspecified type    Allergies as of 09/02/2017   No Known Allergies     Medication List    TAKE these medications   DICLEGIS 10-10 MG Tbec Generic drug:  Doxylamine-Pyridoxine Take 1-2 tablets by mouth 3 (three) times daily as needed (for nausea/vomiting).   labetalol 200 MG tablet Commonly known as:  NORMODYNE Take 200 mg by mouth 2 (two) times daily.   multivitamin-prenatal 27-0.8 MG Tabs tablet Take 1 tablet by mouth at bedtime.   ondansetron 8 MG disintegrating tablet Commonly known as:  ZOFRAN-ODT Take 8 mg by mouth every 8 (eight) hours as needed for nausea or vomiting.   pantoprazole 20 MG tablet Commonly known as:  PROTONIX Take 20 mg by mouth daily.   promethazine 25 MG tablet Commonly known as:  PHENERGAN Take 25 mg by  mouth every 6 (six) hours as needed for nausea or vomiting.   promethazine 25 MG suppository Commonly known as:  PHENERGAN Place 25 mg rectally every 6 (six) hours as needed for nausea or vomiting.   ranitidine 150 MG tablet Commonly known as:  ZANTAC Take 150 mg by mouth 2 (two) times daily.      Follow-up Information    Ranae Pila, MD Follow up in 2 day(s).   Specialty:  Obstetrics and Gynecology Why:  Call office to get BP check on Monday Contact information: [address] [place] [address] [phone]           Caren Griffins CNM 09/02/2017, 4:04 PM

## 2017-09-02 NOTE — MAU Note (Signed)
Pt stated she has not been able to eat for the past 3 days. Everything she eats she vomits up.C/O  Feeling a little dizzy and light headed and having some cramps.

## 2017-09-02 NOTE — Discharge Instructions (Signed)
Dehydration, Adult Dehydration is a condition in which there is not enough fluid or water in the body. This happens when you lose more fluids than you take in. Important organs, such as the kidneys, brain, and heart, cannot function without a proper amount of fluids. Any loss of fluids from the body can lead to dehydration. Dehydration can range from mild to severe. This condition should be treated right away to prevent it from becoming severe. What are the causes? This condition may be caused by:  Vomiting.  Diarrhea.  Excessive sweating, such as from heat exposure or exercise.  Not drinking enough fluid, especially: ? When ill. ? While doing activity that requires a lot of energy.  Excessive urination.  Fever.  Infection.  Certain medicines, such as medicines that cause the body to lose excess fluid (diuretics).  Inability to access safe drinking water.  Reduced physical ability to get adequate water and food.  What increases the risk? This condition is more likely to develop in people:  Who have a poorly controlled long-term (chronic) illness, such as diabetes, heart disease, or kidney disease.  Who are age 38 or older.  Who are disabled.  Who live in a place with high altitude.  Who play endurance sports.  What are the signs or symptoms? Symptoms of mild dehydration may include:  Thirst.  Dry lips.  Slightly dry mouth.  Dry, warm skin.  Dizziness. Symptoms of moderate dehydration may include:  Very dry mouth.  Muscle cramps.  Dark urine. Urine may be the color of tea.  Decreased urine production.  Decreased tear production.  Heartbeat that is irregular or faster than normal (palpitations).  Headache.  Light-headedness, especially when you stand up from a sitting position.  Fainting (syncope). Symptoms of severe dehydration may include:  Changes in skin, such as: ? Cold and clammy skin. ? Blotchy (mottled) or pale skin. ? Skin that does  not quickly return to normal after being lightly pinched and released (poor skin turgor).  Changes in body fluids, such as: ? Extreme thirst. ? No tear production. ? Inability to sweat when body temperature is high, such as in hot weather. ? Very little urine production.  Changes in vital signs, such as: ? Weak pulse. ? Pulse that is more than 100 beats a minute when sitting still. ? Rapid breathing. ? Low blood pressure.  Other changes, such as: ? Sunken eyes. ? Cold hands and feet. ? Confusion. ? Lack of energy (lethargy). ? Difficulty waking up from sleep. ? Short-term weight loss. ? Unconsciousness. How is this diagnosed? This condition is diagnosed based on your symptoms and a physical exam. Blood and urine tests may be done to help confirm the diagnosis. How is this treated? Treatment for this condition depends on the severity. Mild or moderate dehydration can often be treated at home. Treatment should be started right away. Do not wait until dehydration becomes severe. Severe dehydration is an emergency and it needs to be treated in a hospital. Treatment for mild dehydration may include:  Drinking more fluids.  Replacing salts and minerals in your blood (electrolytes) that you may have lost. Treatment for moderate dehydration may include:  Drinking an oral rehydration solution (ORS). This is a drink that helps you replace fluids and electrolytes (rehydrate). It can be found at pharmacies and retail stores. Treatment for severe dehydration may include:  Receiving fluids through an IV tube.  Receiving an electrolyte solution through a feeding tube that is passed through your nose  and into your stomach (nasogastric tube, or NG tube).  Correcting any abnormalities in electrolytes.  Treating the underlying cause of dehydration. Follow these instructions at home:  If directed by your health care provider, drink an ORS: ? Make an ORS by following instructions on the  package. ? Start by drinking small amounts, about  cup (120 mL) every 5-10 minutes. ? Slowly increase how much you drink until you have taken the amount recommended by your health care provider.  Drink enough clear fluid to keep your urine clear or pale yellow. If you were told to drink an ORS, finish the ORS first, then start slowly drinking other clear fluids. Drink fluids such as: ? Water. Do not drink only water. Doing that can lead to having too little salt (sodium) in the body (hyponatremia). ? Ice chips. ? Fruit juice that you have added water to (diluted fruit juice). ? Low-calorie sports drinks.  Avoid: ? Alcohol. ? Drinks that contain a lot of sugar. These include high-calorie sports drinks, fruit juice that is not diluted, and soda. ? Caffeine. ? Foods that are greasy or contain a lot of fat or sugar.  Take over-the-counter and prescription medicines only as told by your health care provider.  Do not take sodium tablets. This can lead to having too much sodium in the body (hypernatremia).  Eat foods that contain a healthy balance of electrolytes, such as bananas, oranges, potatoes, tomatoes, and spinach.  Keep all follow-up visits as told by your health care provider. This is important. Contact a health care provider if:  You have abdominal pain that: ? Gets worse. ? Stays in one area (localizes).  You have a rash.  You have a stiff neck.  You are more irritable than usual.  You are sleepier or more difficult to wake up than usual.  You feel weak or dizzy.  You feel very thirsty.  You have urinated only a small amount of very dark urine over 6-8 hours. Get help right away if:  You have symptoms of severe dehydration.  You cannot drink fluids without vomiting.  Your symptoms get worse with treatment.  You have a fever.  You have a severe headache.  You have vomiting or diarrhea that: ? Gets worse. ? Does not go away.  You have blood or green matter  (bile) in your vomit.  You have blood in your stool. This may cause stool to look black and tarry.  You have not urinated in 6-8 hours.  You faint.  Your heart rate while sitting still is over 100 beats a minute.  You have trouble breathing. This information is not intended to replace advice given to you by your health care provider. Make sure you discuss any questions you have with your health care provider. Document Released: 11/07/2005 Document Revised: 06/03/2016 Document Reviewed: 01/01/2016 Elsevier Interactive Patient Education  2018 [employer] Potassium Content of Foods Potassium is a mineral found in many foods and drinks. It helps keep fluids and minerals balanced in your body and affects how steadily your heart beats. Potassium also helps control your blood pressure and keep your muscles and nervous system healthy. Certain health conditions and medicines may change the balance of potassium in your body. When this happens, you can help balance your level of potassium through the foods that you do or do not eat. Your health care provider or dietitian may recommend an amount of potassium that you should have each day. The following lists of foods provide  the amount of potassium (in parentheses) per serving in each item. High in potassium The following foods and beverages have 200 mg or more of potassium per serving:  Apricots, 2 raw or 5 dry (200 mg).  Artichoke, 1 medium (345 mg).  Avocado, raw,  each (245 mg).  Banana, 1 medium (425 mg).  Beans, lima, or baked beans, canned,  cup (280 mg).  Beans, white, canned,  cup (595 mg).  Beef roast, 3 oz (320 mg).  Beef, ground, 3 oz (270 mg).  Beets, raw or cooked,  cup (260 mg).  Bran muffin, 2 oz (300 mg).  Broccoli,  cup (230 mg).  Brussels sprouts,  cup (250 mg).  Cantaloupe,  cup (215 mg).  Cereal, 100% bran,  cup (200-400 mg).  Cheeseburger, single, fast food, 1 each (225-400 mg).  Chicken, 3 oz  (220 mg).  Clams, canned, 3 oz (535 mg).  Crab, 3 oz (225 mg).  Dates, 5 each (270 mg).  Dried beans and peas,  cup (300-475 mg).  Figs, dried, 2 each (260 mg).  Fish: halibut, tuna, cod, snapper, 3 oz (480 mg).  Fish: salmon, haddock, swordfish, perch, 3 oz (300 mg).  Fish, tuna, canned 3 oz (200 mg).  [place] fries, fast food, 3 oz (470 mg).  Granola with fruit and nuts,  cup (200 mg).  Grapefruit juice,  cup (200 mg).  Greens, beet,  cup (655 mg).  Honeydew melon,  cup (200 mg).  Kale, raw, 1 cup (300 mg).  Kiwi, 1 medium (240 mg).  Kohlrabi, rutabaga, parsnips,  cup (280 mg).  Lentils,  cup (365 mg).  Mango, 1 each (325 mg).  Milk, chocolate, 1 cup (420 mg).  Milk: nonfat, low-fat, whole, buttermilk, 1 cup (350-380 mg).  Molasses, 1 Tbsp (295 mg).  Mushrooms,  cup (280) mg.  Nectarine, 1 each (275 mg).  Nuts: almonds, peanuts, hazelnuts, [place], cashew, mixed, 1 oz (200 mg).  Nuts, pistachios, 1 oz (295 mg).  Orange, 1 each (240 mg).  Orange juice,  cup (235 mg).  Papaya, medium,  fruit (390 mg).  Peanut butter, chunky, 2 Tbsp (240 mg).  Peanut butter, smooth, 2 Tbsp (210 mg).  Pear, 1 medium (200 mg).  Pomegranate, 1 whole (400 mg).  Pomegranate juice,  cup (215 mg).  Pork, 3 oz (350 mg).  Potato chips, salted, 1 oz (465 mg).  Potato, baked with skin, 1 medium (925 mg).  Potatoes, boiled,  cup (255 mg).  Potatoes, mashed,  cup (330 mg).  Prune juice,  cup (370 mg).  Prunes, 5 each (305 mg).  Pudding, chocolate,  cup (230 mg).  Pumpkin, canned,  cup (250 mg).  Raisins, seedless,  cup (270 mg).  Seeds, sunflower or pumpkin, 1 oz (240 mg).  Soy milk, 1 cup (300 mg).  Spinach,  cup (420 mg).  Spinach, canned,  cup (370 mg).  Sweet potato, baked with skin, 1 medium (450 mg).  Swiss chard,  cup (480 mg).  Tomato or vegetable juice,  cup (275 mg).  Tomato sauce or puree,  cup (400-550  mg).  Tomato, raw, 1 medium (290 mg).  Tomatoes, canned,  cup (200-300 mg).  [place], 3 oz (250 mg).  Wheat germ, 1 oz (250 mg).  Winter squash,  cup (250 mg).  Yogurt, plain or fruited, 6 oz (260-435 mg).  Zucchini,  cup (220 mg).  Moderate in potassium The following foods and beverages have 50-200 mg of potassium per serving:  Apple, 1 each (150 mg).  Apple juice,  cup (150 mg).  Applesauce,  cup (90 mg).  Apricot nectar,  cup (140 mg).  Asparagus, small spears,  cup or 6 spears (155 mg).  Bagel, cinnamon raisin, 1 each (130 mg).  Bagel, egg or plain, 4 in., 1 each (70 mg).  Beans, green,  cup (90 mg).  Beans, yellow,  cup (190 mg).  Beer, regular, 12 oz (100 mg).  Beets, canned,  cup (125 mg).  Blackberries,  cup (115 mg).  Blueberries,  cup (60 mg).  Bread, whole wheat, 1 slice (70 mg).  Broccoli, raw,  cup (145 mg).  Cabbage,  cup (150 mg).  Carrots, cooked or raw,  cup (180 mg).  Cauliflower, raw,  cup (150 mg).  Celery, raw,  cup (155 mg).  Cereal, bran flakes, cup (120-150 mg).  Cheese, cottage,  cup (110 mg).  Cherries, 10 each (150 mg).  Chocolate, 1 oz bar (165 mg).  Coffee, brewed 6 oz (90 mg).  Corn,  cup or 1 ear (195 mg).  Cucumbers,  cup (80 mg).  Egg, large, 1 each (60 mg).  Eggplant,  cup (60 mg).  Endive, raw, cup (80 mg).  English muffin, 1 each (65 mg).  Fish, orange roughy, 3 oz (150 mg).  Frankfurter, beef or pork, 1 each (75 mg).  Fruit cocktail,  cup (115 mg).  Grape juice,  cup (170 mg).  Grapefruit,  fruit (175 mg).  Grapes,  cup (155 mg).  Greens: kale, turnip, collard,  cup (110-150 mg).  Ice cream or frozen yogurt, chocolate,  cup (175 mg).  Ice cream or frozen yogurt, vanilla,  cup (120-150 mg).  Lemons, limes, 1 each (80 mg).  Lettuce, all types, 1 cup (100 mg).  Mixed vegetables,  cup (150 mg).  Mushrooms, raw,  cup (110 mg).  Nuts: walnuts, pecans,  or macadamia, 1 oz (125 mg).  Oatmeal,  cup (80 mg).  Okra,  cup (110 mg).  Onions, raw,  cup (120 mg).  Peach, 1 each (185 mg).  Peaches, canned,  cup (120 mg).  Pears, canned,  cup (120 mg).  Peas, green, frozen,  cup (90 mg).  Peppers, green,  cup (130 mg).  Peppers, red,  cup (160 mg).  Pineapple juice,  cup (165 mg).  Pineapple, fresh or canned,  cup (100 mg).  Plums, 1 each (105 mg).  Pudding, vanilla,  cup (150 mg).  Raspberries,  cup (90 mg).  Rhubarb,  cup (115 mg).  Rice, wild,  cup (80 mg).  Shrimp, 3 oz (155 mg).  Spinach, raw, 1 cup (170 mg).  Strawberries,  cup (125 mg).  Summer squash  cup (175-200 mg).  Swiss chard, raw, 1 cup (135 mg).  Tangerines, 1 each (140 mg).  Tea, brewed, 6 oz (65 mg).  Turnips,  cup (140 mg).  Watermelon,  cup (85 mg).  Wine, red, table, 5 oz (180 mg).  Wine, white, table, 5 oz (100 mg).  Low in potassium The following foods and beverages have less than 50 mg of potassium per serving.  Bread, white, 1 slice (30 mg).  Carbonated beverages, 12 oz (less than 5 mg).  Cheese, 1 oz (20-30 mg).  Cranberries,  cup (45 mg).  Cranberry juice cocktail,  cup (20 mg).  Fats and oils, 1 Tbsp (less than 5 mg).  Hummus, 1 Tbsp (32 mg).  Nectar: papaya, mango, or pear,  cup (35 mg).  Rice, white or brown,  cup (50 mg).  Spaghetti or macaroni,  cup cooked (30 mg).  Tortilla, flour or corn, 1 each (50 mg).  Waffle, 4 in., 1 each (50 mg).  Water chestnuts,  cup (40 mg).  This information is not intended to replace advice given to you by your health care provider. Make sure you discuss any questions you have with your health care provider. Document Released: 06/21/2005 Document Revised: 04/14/2016 Document Reviewed: 10/04/2013 Elsevier Interactive Patient Education  [employer].

## 2017-09-05 LAB — CULTURE, OB URINE

## 2017-09-14 ENCOUNTER — Encounter (HOSPITAL_COMMUNITY): Payer: Self-pay | Admitting: *Deleted

## 2017-09-14 ENCOUNTER — Telehealth (HOSPITAL_COMMUNITY): Payer: Self-pay | Admitting: *Deleted

## 2017-09-14 NOTE — Telephone Encounter (Signed)
Preadmission screen  

## 2017-09-19 ENCOUNTER — Encounter (HOSPITAL_COMMUNITY): Payer: Self-pay | Admitting: *Deleted

## 2017-09-19 ENCOUNTER — Telehealth (HOSPITAL_COMMUNITY): Payer: Self-pay | Admitting: *Deleted

## 2017-09-19 NOTE — Telephone Encounter (Signed)
Preadmission screen  

## 2017-09-22 ENCOUNTER — Inpatient Hospital Stay (HOSPITAL_COMMUNITY): Payer: 59 | Admitting: Anesthesiology

## 2017-09-22 ENCOUNTER — Inpatient Hospital Stay (HOSPITAL_COMMUNITY)
Admission: RE | Admit: 2017-09-22 | Discharge: 2017-09-25 | DRG: 807 | Disposition: A | Discharge status: Home / Self Care | Payer: 59 | Source: Ambulatory Visit | Attending: Obstetrics and Gynecology | Admitting: Obstetrics and Gynecology

## 2017-09-22 ENCOUNTER — Encounter (HOSPITAL_COMMUNITY): Payer: Self-pay

## 2017-09-22 DIAGNOSIS — Z3A38 38 weeks gestation of pregnancy: Secondary | ICD-10-CM

## 2017-09-22 DIAGNOSIS — O10919 Unspecified pre-existing hypertension complicating pregnancy, unspecified trimester: Secondary | ICD-10-CM | POA: Diagnosis present

## 2017-09-22 DIAGNOSIS — O1002 Pre-existing essential hypertension complicating childbirth: Principal | ICD-10-CM | POA: Diagnosis present

## 2017-09-22 LAB — CBC
HCT: 29.4 % — ABNORMAL LOW (ref 36.0–46.0)
HEMOGLOBIN: 10 g/dL — AB (ref 12.0–15.0)
MCH: 26 pg (ref 26.0–34.0)
MCHC: 34 g/dL (ref 30.0–36.0)
MCV: 76.6 fL — ABNORMAL LOW (ref 78.0–100.0)
PLATELETS: 308 10*3/uL (ref 150–400)
RBC: 3.84 MIL/uL — ABNORMAL LOW (ref 3.87–5.11)
RDW: 16.4 % — ABNORMAL HIGH (ref 11.5–15.5)
WBC: 7.4 10*3/uL (ref 4.0–10.5)

## 2017-09-22 LAB — TYPE AND SCREEN
ABO/RH(D): O POS
Antibody Screen: NEGATIVE

## 2017-09-22 LAB — RPR: RPR Ser Ql: NONREACTIVE

## 2017-09-22 LAB — ABO/RH: ABO/RH(D): O POS

## 2017-09-22 MED ORDER — SOD CITRATE-CITRIC ACID 500-334 MG/5ML PO SOLN
30.0000 mL | ORAL | Status: DC | PRN
  Filled 2017-09-22: qty 15

## 2017-09-22 MED ORDER — FENTANYL 2.5 MCG/ML BUPIVACAINE 1/10 % EPIDURAL INFUSION (WH - ANES)
INTRAMUSCULAR | Status: AC
  Filled 2017-09-22: qty 100

## 2017-09-22 MED ORDER — OXYTOCIN BOLUS FROM INFUSION
500.0000 mL | Freq: Once | INTRAVENOUS | Status: AC
  Administered 2017-09-23: 500 mL via INTRAVENOUS

## 2017-09-22 MED ORDER — MISOPROSTOL 25 MCG QUARTER TABLET
25.0000 ug | ORAL_TABLET | ORAL | Status: DC | PRN
Start: 2017-09-22 — End: 2017-09-23
  Administered 2017-09-22 (×3): 25 ug via VAGINAL
  Filled 2017-09-22 (×4): qty 1

## 2017-09-22 MED ORDER — TERBUTALINE SULFATE 1 MG/ML IJ SOLN
0.2500 mg | Freq: Once | INTRAMUSCULAR | Status: DC | PRN
  Filled 2017-09-22: qty 1

## 2017-09-22 MED ORDER — MAGNESIUM SULFATE 4 GM/100ML IV SOLN: 4.0000 g | Freq: Once | INTRAVENOUS | Status: DC

## 2017-09-22 MED ORDER — PHENYLEPHRINE 40 MCG/ML (10ML) SYRINGE FOR IV PUSH (FOR BLOOD PRESSURE SUPPORT)
80.0000 ug | PREFILLED_SYRINGE | INTRAVENOUS | Status: DC | PRN
  Filled 2017-09-22: qty 5

## 2017-09-22 MED ORDER — LACTATED RINGERS IV SOLN
500.0000 mL | INTRAVENOUS | Status: DC | PRN
  Administered 2017-09-22 – 2017-09-23 (×2): 500 mL via INTRAVENOUS

## 2017-09-22 MED ORDER — LABETALOL HCL 200 MG PO TABS
200.0000 mg | ORAL_TABLET | Freq: Two times a day (BID) | ORAL | Status: DC
  Administered 2017-09-22: 200 mg via ORAL
  Filled 2017-09-22: qty 1

## 2017-09-22 MED ORDER — DIPHENHYDRAMINE HCL 50 MG/ML IJ SOLN: 12.5000 mg | INTRAMUSCULAR | Status: DC | PRN

## 2017-09-22 MED ORDER — LABETALOL HCL 5 MG/ML IV SOLN
INTRAVENOUS | Status: AC
  Filled 2017-09-22: qty 4

## 2017-09-22 MED ORDER — EPHEDRINE 5 MG/ML INJ
10.0000 mg | INTRAVENOUS | Status: DC | PRN
  Filled 2017-09-22: qty 2

## 2017-09-22 MED ORDER — OXYCODONE-ACETAMINOPHEN 5-325 MG PO TABS: 2.0000 | ORAL_TABLET | ORAL | Status: DC | PRN

## 2017-09-22 MED ORDER — MAGNESIUM SULFATE 40 G IN LACTATED RINGERS - SIMPLE: 2.0000 g/h | INTRAVENOUS | Status: DC

## 2017-09-22 MED ORDER — ONDANSETRON HCL 4 MG/2ML IJ SOLN: 4.0000 mg | Freq: Four times a day (QID) | INTRAMUSCULAR | Status: DC | PRN

## 2017-09-22 MED ORDER — LACTATED RINGERS IV SOLN
INTRAVENOUS | Status: DC
  Administered 2017-09-22 – 2017-09-23 (×4): via INTRAVENOUS

## 2017-09-22 MED ORDER — ACETAMINOPHEN 325 MG PO TABS: 650.0000 mg | ORAL_TABLET | ORAL | Status: DC | PRN

## 2017-09-22 MED ORDER — LIDOCAINE HCL (PF) 1 % IJ SOLN
INTRAMUSCULAR | Status: DC | PRN
  Administered 2017-09-22: 4 mL
  Administered 2017-09-22: 6 mL via EPIDURAL

## 2017-09-22 MED ORDER — OXYTOCIN 40 UNITS IN LACTATED RINGERS INFUSION - SIMPLE MED
1.0000 m[IU]/min | INTRAVENOUS | Status: DC
  Administered 2017-09-22: 2 m[IU]/min via INTRAVENOUS
  Filled 2017-09-22: qty 1000

## 2017-09-22 MED ORDER — OXYCODONE-ACETAMINOPHEN 5-325 MG PO TABS: 1.0000 | ORAL_TABLET | ORAL | Status: DC | PRN

## 2017-09-22 MED ORDER — LABETALOL HCL 200 MG PO TABS
200.0000 mg | ORAL_TABLET | Freq: Three times a day (TID) | ORAL | Status: DC
  Administered 2017-09-22: 200 mg via ORAL
  Filled 2017-09-22: qty 1

## 2017-09-22 MED ORDER — PHENYLEPHRINE 40 MCG/ML (10ML) SYRINGE FOR IV PUSH (FOR BLOOD PRESSURE SUPPORT)
PREFILLED_SYRINGE | INTRAVENOUS | Status: AC
  Filled 2017-09-22: qty 20

## 2017-09-22 MED ORDER — OXYTOCIN 40 UNITS IN LACTATED RINGERS INFUSION - SIMPLE MED: 2.5000 [IU]/h | INTRAVENOUS | Status: DC

## 2017-09-22 MED ORDER — LABETALOL HCL 5 MG/ML IV SOLN
10.0000 mg | Freq: Once | INTRAVENOUS | Status: AC
  Administered 2017-09-22: 10 mg via INTRAVENOUS

## 2017-09-22 MED ORDER — LIDOCAINE HCL (PF) 1 % IJ SOLN
30.0000 mL | INTRAMUSCULAR | Status: DC | PRN
  Filled 2017-09-22: qty 30

## 2017-09-22 MED ORDER — FENTANYL 2.5 MCG/ML BUPIVACAINE 1/10 % EPIDURAL INFUSION (WH - ANES)
14.0000 mL/h | INTRAMUSCULAR | Status: DC | PRN
  Administered 2017-09-22 (×2): 14 mL/h via EPIDURAL
  Filled 2017-09-22: qty 100

## 2017-09-22 MED ORDER — LACTATED RINGERS IV SOLN
500.0000 mL | Freq: Once | INTRAVENOUS | Status: AC
  Administered 2017-09-22: 250 mL via INTRAVENOUS

## 2017-09-22 NOTE — Progress Notes (Signed)
Pt comfortable w/ epidural BP 150-160/90-100  FHT cat 1 Toco Q3-6 Cvx 2/70/-2  A/P:  Continue labetalol & pitocin

## 2017-09-22 NOTE — H&P (Signed)
Michelle Rojas is a 36 y.o. female @ 38 wks presenting for IOL.  [facility] taking PO labetalol 200mg  bid with pretty good control.  S/p cytotec overnight.  OB History    Gravida Para Term Preterm AB Living   3 1 1   1 1    SAB TAB Ectopic Multiple Live Births   1       1     Past Medical History:  Diagnosis Date  . Anxiety    PP treated with counseling no meds  . GERD (gastroesophageal reflux disease)   . History of palpitations    negative work-up by cardiologist-- dr Eden Emmsnishan-- 2015  . Hypertension    taking labetalol  . MCTD (mixed connective tissue disease) [facility](HCC)    dx 07/ 2015--  rheumotologist--  dr Zenovia Jordanangela hawkes  . Nausea   . Wears contact lenses    Past Surgical History:  Procedure Laterality Date  . DILATION AND EVACUATION N/A 06/17/2016   Procedure: DILATATION AND EVACUATION;  Surgeon: Mitchel HonourMegan Morris, DO;  [facility] ;  Service: Gynecology;  Laterality: N/A;  WTIH SUCTION  . WISDOM TOOTH EXTRACTION  2012   Family History: family history includes Hypertension in her father, maternal grandmother, and mother. Social History:  reports that she has never smoked. She has never used smokeless tobacco. She reports that she does not use drugs. Her alcohol history is not on file.     Maternal Diabetes: No Genetic Screening: Normal Maternal Ultrasounds/Referrals: Normal Fetal Ultrasounds or other Referrals:  None Maternal Substance Abuse:  No Significant Maternal Medications:  None Significant Maternal Lab Results:  None Other Comments:  None  ROS History Dilation: Fingertip Effacement (%): Thick Station: -3 Exam by:: Milus GlazierJennifer Hamilton RN Blood pressure (!) 160/98, pulse 84, temperature 98.4 F (36.9 C), temperature source Oral, resp. rate 18, weight 206 lb (93.4 kg), last menstrual period 01/04/2016. Exam Physical Exam  Gen - NAD Abd - gravid, NT Ext - NT, no edema Cvx FT  Prenatal labs: ABO, Rh: --/--/O POS, O  POS (11/02 0110) Antibody: NEG (11/02 0110) Rubella: Immune (04/05 0000) RPR: Nonreactive (04/05 0000)  HBsAg: Negative (04/05 0000)  HIV: Non-reactive (04/05 0000)  GBS:   neg  Assessment/Plan: Admit Continue cytotec Labetalol Epidural prn    Ellene Bloodsaw 09/22/2017, 9:43 AM

## 2017-09-22 NOTE — Anesthesia Procedure Notes (Signed)
Epidural Patient location during procedure: OB  Staffing Anesthesiologist: Cristela BlueJACKSON, Michelle Wold  Preanesthetic Checklist Completed: patient identified, pre-op evaluation, timeout performed, IV checked, risks and benefits discussed and monitors and equipment checked  Epidural Patient position: sitting Prep: DuraPrep Patient monitoring: blood pressure and continuous pulse ox Approach: right paramedian Location: L3-L4 Injection technique: LOR air  Needle:  Needle type: Tuohy  Needle gauge: 17 G Needle insertion depth: 8 cm Catheter type: closed end flexible Catheter size: 19 Gauge Catheter at skin depth: 15 cm Test dose: negative  Assessment Sensory level: T8  Additional Notes  Very steep approach to LOR Dosing of Epidural:  1st dose, through catheter ............................................Marland Kitchen.  Xylocaine 40 mg  2nd dose, through catheter, after waiting 3 minutes........Marland Kitchen.Xylocaine 60 mg    As each dose occurred, patient was free of IV sx; and patient exhibited no evidence of SA injection.  Patient is more comfortable after epidural dosed. Please see RN's note for documentation of vital signs,and FHR which are stable.  Patient reminded not to try to ambulate with numb legs, and that an RN must be present when she attempts to get up.

## 2017-09-22 NOTE — Anesthesia Preprocedure Evaluation (Signed)
Anesthesia Evaluation  Patient identified by MRN, date of birth, ID band Patient awake    Reviewed: Allergy & Precautions, H&P , Patient's Chart, lab work & pertinent test results, reviewed documented beta blocker date and time   Airway Mallampati: II  TM Distance: >3 FB Neck ROM: full    Dental no notable dental hx.    Pulmonary    Pulmonary exam normal breath sounds clear to auscultation       Cardiovascular hypertension,  Rhythm:regular Rate:Normal     Neuro/Psych    GI/Hepatic   Endo/Other    Renal/GU      Musculoskeletal   Abdominal   Peds  Hematology   Anesthesia Other Findings MCTD (mixed connective tissue disease) .Marland Kitchen...dx 07/ 2015-- rheumotologist-- dr Zenovia Jordanangela hawkes.  Affects knees and elbows only.Marland Kitchen.  History of palpitations  negative work-up by cardiologist-- dr Eden Emmsnishan-- 2015  GERD    Hypertension taking labetalol       Reproductive/Obstetrics                             Anesthesia Physical Anesthesia Plan  ASA: II  Anesthesia Plan: Epidural   Post-op Pain Management:    Induction:   PONV Risk Score and Plan:   Airway Management Planned:   Additional Equipment:   Intra-op Plan:   Post-operative Plan:   Informed Consent: I have reviewed the patients History and Physical, chart, labs and discussed the procedure including the risks, benefits and alternatives for the proposed anesthesia with the patient or authorized representative who has indicated his/her understanding and acceptance.   Dental Advisory Given  Plan Discussed with: CRNA and Surgeon  Anesthesia Plan Comments: (Labs checked- platelets confirmed with RN in room. Fetal heart tracing, per RN, reported to be stable enough for sitting procedure. Discussed epidural, and patient consents to the procedure:  included risk of possible headache,backache, failed block, allergic reaction, and nerve injury. This  patient was asked if she had any questions or concerns before the procedure started.)        Anesthesia Quick Evaluation

## 2017-09-22 NOTE — Progress Notes (Signed)
Pt comfortable w/ epidural  FHT decreased variablility, good scalp stim w/ exam Toco q3-5 difficult to trace Cvx 2.5/70/-2 AROM - clear IUPC/FSE placed  A/P:  Continue exp mngt Pitocin

## 2017-09-22 NOTE — Progress Notes (Signed)
Pt was getting painful ctx.  Now comfortable w/ epidural.   S/p cytotec x 3  FHT cat 1 Toco Q2-8 Cvx 0.5cm  A/P:  Will start pitocin BP improved w/ labetalol

## 2017-09-22 NOTE — Anesthesia Pain Management Evaluation Note (Signed)
  CRNA Pain Management Visit Note  Patient: Michelle GalesWarditra A Rojas, 36 y.o., female  "Hello I am a member of the anesthesia [facility]. We have an anesthesia team available at all times to provide care throughout the hospital, including epidural management and anesthesia for C-section. I don't know your plan for the delivery whether it a natural birth, water birth, IV sedation, nitrous supplementation, doula or epidural, but we want to meet your pain goals."   1.Was your pain managed to your expectations on prior hospitalizations?   Yes   2.What is your expectation for pain management during this hospitalization?     Epidural  3.How can we help you reach that goal? epidural  Record the patient's initial score and the patient's pain goal.   Pain: 5  Pain Goal: 7 [facility] wants you to be able to say your pain was always managed very well.  Michelle Rojas 09/22/2017

## 2017-09-23 ENCOUNTER — Encounter (HOSPITAL_COMMUNITY): Payer: Self-pay

## 2017-09-23 LAB — CBC
HCT: 31.3 % — ABNORMAL LOW (ref 36.0–46.0)
HCT: 29.9 % — ABNORMAL LOW (ref 36.0–46.0)
HEMOGLOBIN: 10.4 g/dL — AB (ref 12.0–15.0)
Hemoglobin: 10.1 g/dL — ABNORMAL LOW (ref 12.0–15.0)
MCH: 25.1 pg — AB (ref 26.0–34.0)
MCH: 25.7 pg — AB (ref 26.0–34.0)
MCHC: 33.2 g/dL (ref 30.0–36.0)
MCHC: 33.8 g/dL (ref 30.0–36.0)
MCV: 75.4 fL — AB (ref 78.0–100.0)
MCV: 76.1 fL — AB (ref 78.0–100.0)
PLATELETS: 284 10*3/uL (ref 150–400)
Platelets: 273 10*3/uL (ref 150–400)
RBC: 4.15 MIL/uL (ref 3.87–5.11)
RBC: 3.93 MIL/uL (ref 3.87–5.11)
RDW: 16.2 % — ABNORMAL HIGH (ref 11.5–15.5)
RDW: 16.1 % — AB (ref 11.5–15.5)
WBC: 11.4 10*3/uL — ABNORMAL HIGH (ref 4.0–10.5)
WBC: 11.7 10*3/uL — AB (ref 4.0–10.5)

## 2017-09-23 MED ORDER — MEASLES, MUMPS & RUBELLA VAC ~~LOC~~ INJ
0.5000 mL | INJECTION | Freq: Once | SUBCUTANEOUS | Status: DC
  Filled 2017-09-23: qty 0.5

## 2017-09-23 MED ORDER — ONDANSETRON HCL 4 MG PO TABS: 4.0000 mg | ORAL_TABLET | ORAL | Status: DC | PRN

## 2017-09-23 MED ORDER — ONDANSETRON HCL 4 MG/2ML IJ SOLN: 4.0000 mg | INTRAMUSCULAR | Status: DC | PRN

## 2017-09-23 MED ORDER — SENNOSIDES-DOCUSATE SODIUM 8.6-50 MG PO TABS
2.0000 | ORAL_TABLET | ORAL | Status: DC
  Administered 2017-09-23 – 2017-09-25 (×2): 2 via ORAL
  Filled 2017-09-23 (×2): qty 2

## 2017-09-23 MED ORDER — ACETAMINOPHEN 325 MG PO TABS
650.0000 mg | ORAL_TABLET | ORAL | Status: DC | PRN
Start: 2017-09-23 — End: 2017-09-25

## 2017-09-23 MED ORDER — TETANUS-DIPHTH-ACELL PERTUSSIS 5-2.5-18.5 LF-MCG/0.5 IM SUSP: 0.5000 mL | Freq: Once | INTRAMUSCULAR | Status: DC

## 2017-09-23 MED ORDER — PRENATAL MULTIVITAMIN CH
1.0000 | ORAL_TABLET | Freq: Every day | ORAL | Status: DC
  Administered 2017-09-23 – 2017-09-25 (×3): 1 via ORAL
  Filled 2017-09-23 (×3): qty 1

## 2017-09-23 MED ORDER — MEDROXYPROGESTERONE ACETATE 150 MG/ML IM SUSP: 150.0000 mg | INTRAMUSCULAR | Status: DC | PRN

## 2017-09-23 MED ORDER — OXYCODONE-ACETAMINOPHEN 5-325 MG PO TABS: 1.0000 | ORAL_TABLET | ORAL | Status: DC | PRN

## 2017-09-23 MED ORDER — IBUPROFEN 600 MG PO TABS
600.0000 mg | ORAL_TABLET | Freq: Four times a day (QID) | ORAL | Status: DC
  Administered 2017-09-23 – 2017-09-25 (×10): 600 mg via ORAL
  Filled 2017-09-23 (×10): qty 1

## 2017-09-23 MED ORDER — PANTOPRAZOLE SODIUM 20 MG PO TBEC
20.0000 mg | DELAYED_RELEASE_TABLET | Freq: Every day | ORAL | Status: DC
  Administered 2017-09-23 – 2017-09-25 (×3): 20 mg via ORAL
  Filled 2017-09-23 (×5): qty 1

## 2017-09-23 MED ORDER — OXYCODONE-ACETAMINOPHEN 5-325 MG PO TABS: 2.0000 | ORAL_TABLET | ORAL | Status: DC | PRN

## 2017-09-23 MED ORDER — SIMETHICONE 80 MG PO CHEW: 80.0000 mg | CHEWABLE_TABLET | ORAL | Status: DC | PRN

## 2017-09-23 MED ORDER — BENZOCAINE-MENTHOL 20-0.5 % EX AERO
1.0000 "application " | INHALATION_SPRAY | CUTANEOUS | Status: DC | PRN
  Administered 2017-09-23: 1 via TOPICAL
  Filled 2017-09-23: qty 56

## 2017-09-23 MED ORDER — DIPHENHYDRAMINE HCL 25 MG PO CAPS: 25.0000 mg | ORAL_CAPSULE | Freq: Four times a day (QID) | ORAL | Status: DC | PRN

## 2017-09-23 MED ORDER — DIBUCAINE 1 % RE OINT: 1.0000 "application " | TOPICAL_OINTMENT | RECTAL | Status: DC | PRN

## 2017-09-23 MED ORDER — WITCH HAZEL-GLYCERIN EX PADS: 1.0000 "application " | MEDICATED_PAD | CUTANEOUS | Status: DC | PRN

## 2017-09-23 MED ORDER — LACTATED RINGERS IV SOLN
INTRAVENOUS | Status: DC
  Administered 2017-09-23: via INTRAUTERINE

## 2017-09-23 MED ORDER — LABETALOL HCL 200 MG PO TABS
200.0000 mg | ORAL_TABLET | Freq: Two times a day (BID) | ORAL | Status: DC
  Administered 2017-09-23 – 2017-09-24 (×3): 200 mg via ORAL
  Filled 2017-09-23 (×3): qty 1

## 2017-09-23 MED ORDER — COCONUT OIL OIL: 1.0000 "application " | TOPICAL_OIL | Status: DC | PRN

## 2017-09-23 NOTE — Progress Notes (Signed)
Post Partum Day 0 Subjective: no complaints  Objective: Blood pressure (!) 152/89, pulse 93, temperature 99.6 F (37.6 C), temperature source Oral, resp. rate 18, weight 206 lb (93.4 kg), last menstrual period 01/04/2016, unknown if currently breastfeeding.  Physical Exam:  General: alert and cooperative Lochia: appropriate Uterine Fundus: firm Incision: n/a DVT Evaluation: No evidence of DVT seen on physical exam.   Recent Labs  09/23/17 0237 09/23/17 0620  HGB 10.4* 10.1*  HCT 31.3* 29.9*    Assessment/Plan: Breastfeeding  Continue current care   LOS: 1 day   Marg Macmaster 09/23/2017, 7:04 AM

## 2017-09-23 NOTE — Progress Notes (Signed)
SVD of viable female infant w/ apgars of 8,9.  Placenta delivered spontaneous w/ 3VC.   1st degree & right labial lac repaired w/ 3-0 vicryl rapide.  Fundus firm.  EBL 200cc .

## 2017-09-23 NOTE — Anesthesia Postprocedure Evaluation (Signed)
Anesthesia Post Note  Patient: Michelle Rojas  Procedure(s) Performed: AN AD HOC LABOR EPIDURAL     Patient location during evaluation: Mother Baby Anesthesia Type: Epidural Level of consciousness: awake and alert and oriented Pain management: satisfactory to patient Vital Signs Assessment: post-procedure vital signs reviewed and stable Respiratory status: respiratory function stable Cardiovascular status: stable Postop Assessment: no headache, no backache, epidural receding, patient able to bend at knees, no signs of nausea or vomiting and adequate PO intake Anesthetic complications: no    Last Vitals:  Vitals:   09/23/17 0414 09/23/17 0825  BP: (!) 152/89 138/85  Pulse: 93 74  Resp: 18 18  Temp: 37.6 C 36.8 C    Last Pain:  Vitals:   09/23/17 0825  TempSrc: Oral  PainSc:    Pain Goal: Patients Stated Pain Goal: 3 (09/22/17 2335)               Karleen DolphinFUSSELL,Cicely Ortner

## 2017-09-24 MED ORDER — LABETALOL HCL 200 MG PO TABS
300.0000 mg | ORAL_TABLET | Freq: Two times a day (BID) | ORAL | Status: DC
  Administered 2017-09-24 – 2017-09-25 (×2): 300 mg via ORAL
  Filled 2017-09-24 (×2): qty 1

## 2017-09-24 NOTE — Progress Notes (Signed)
Post Partum Day 1 Subjective: no complaints and up ad lib.  No HA or n/v  Objective: Blood pressure (!) 144/89, pulse 80, temperature 97.6 F (36.4 C), temperature source Axillary, resp. rate 18, weight 206 lb (93.4 kg), last menstrual period 01/04/2016, SpO2 99 %, unknown if currently breastfeeding. BP 140s- 150s/80s-90s Physical Exam:  General: alert and cooperative Lochia: appropriate Uterine Fundus: firm Incision: n/a DVT Evaluation: No evidence of DVT seen on physical exam.  Recent Labs    09/23/17 0237 09/23/17 0620  HGB 10.4* 10.1*  HCT 31.3* 29.9*    Assessment/Plan: Plan for discharge tomorrow  Continue labetalol   LOS: 2 days   Michelle Rojas 09/24/2017, 9:41 AM

## 2017-09-24 NOTE — Progress Notes (Signed)
MOB was referred for history of depression/anxiety.  Referral is screened out by Clinical Social Worker because none of the following criteria appear to apply and there are no reports impacting the pregnancy or her transition to the postpartum period.  CSW does not deem it clinically necessary to further investigate at this time.   -History of anxiety/depression during this pregnancy, or of post-partum depression.  - Diagnosis of anxiety and/or depression within last 3 years.-  - History of depression due to pregnancy loss/loss of child or -MOB's symptoms are currently being treated with medication and/or therapy.  Please contact the Clinical Social Worker if needs arise or upon MOB request.    Dosia Yodice, MSW, LCSW-A Clinical Social Worker  [facility]  Office: [phone]   

## 2017-09-25 MED ORDER — IBUPROFEN 600 MG PO TABS: 600.0000 mg | ORAL_TABLET | Freq: Four times a day (QID) | ORAL | 0 refills | Status: DC

## 2017-09-25 MED ORDER — LABETALOL HCL 200 MG PO TABS: 300.0000 mg | ORAL_TABLET | Freq: Three times a day (TID) | ORAL | Status: DC

## 2017-09-25 NOTE — Discharge Summary (Signed)
Obstetric Discharge Summary Reason for Admission: induction of labor Prenatal Procedures: none Intrapartum Procedures: spontaneous vaginal delivery Postpartum Procedures: none Complications-Operative and Postpartum: 1 degree perineal laceration Hemoglobin  Date Value Ref Range Status  09/23/2017 10.1 (L) 12.0 - 15.0 g/dL Final   HCT  Date Value Ref Range Status  09/23/2017 29.9 (L) 36.0 - 46.0 % Final    Physical Exam:  General: alert, cooperative, appears stated age and no distress Lochia: appropriate Uterine Fundus: firm Incision: healing well DVT Evaluation: No evidence of DVT seen on physical exam.  Discharge Diagnoses: Term Pregnancy-delivered  Discharge Information: Date: 09/25/2017 Activity: pelvic rest Diet: routine Medications: Ibuprofen Condition: stable Instructions: refer to practice specific booklet Discharge to: home   Newborn Data:   Luis AbedHancock, Girl Tully [[id number]][[id number]]  Live born female  Birth Weight: 6 lb 6.7 oz (2910 g) APGAR: 8, 9  Newborn Delivery   Birth date/time:  09/23/2017 01:12:00 Delivery type:  Vaginal, Spontaneous      Julieta BelliniHancock, PendingBaby [[id number]][[id number]]  Live born adult  Birth Weight:   APGAR: ,   Newborn Delivery   Birth date/time:   Delivery type:       Home with mother.  Michelle Rojas C 09/25/2017, 9:25 AM

## 2017-09-25 NOTE — Progress Notes (Signed)
Patient's BP=171/96 (sitting) at 1130 before labetalol given. 40 minutes after labetalol 300mg  given BP=158/103 (sitting). BP= 158/91 after patient finished lunch and 2 hours after labetalol given. Dr. Rana SnareLowe notified of elevated BP's. Orders obtained.

## 2017-09-25 NOTE — Progress Notes (Signed)
Dr. Rana SnareLowe stated patient was to f/u tomorrow at his office for BP check and to increase labetalol to 300mg  tid. Patient was given new instructions prior to  discharge.

## 2017-10-08 ENCOUNTER — Encounter (HOSPITAL_COMMUNITY): Payer: Self-pay

## 2018-07-26 ENCOUNTER — Ambulatory Visit: Payer: 59 | Admitting: Neurology

## 2018-09-17 ENCOUNTER — Ambulatory Visit: Payer: 59 | Admitting: Neurology

## 2018-09-17 ENCOUNTER — Encounter: Payer: Self-pay | Admitting: Neurology

## 2018-09-17 VITALS — BP 158/111 | HR 72 | Ht 66.0 in | Wt 210.0 lb

## 2018-09-17 DIAGNOSIS — M79601 Pain in right arm: Secondary | ICD-10-CM

## 2018-09-17 MED ORDER — DICLOFENAC SODIUM 1 % TD GEL: 4.0000 g | Freq: Four times a day (QID) | TRANSDERMAL | 11 refills | Status: DC

## 2018-09-17 NOTE — Progress Notes (Signed)
PATIENT: Michelle Rojas DOB: 1981/11/14  Chief Complaint  Patient presents with  . Numbness    Reports pain/numbness/tingling in right elbow, hand and leg in the evening.  However, she has the same symptoms during the day but on her left side.  Marland Kitchen PCP    Georgianne Fick, MD  . Rheumatology    Rossie Muskrat, MD - referring provider     HISTORICAL  Michelle Rojas is a 37 year old female, seen in request by her primary care physician Dr. Georgianne Fick, and rheumatologist Dr. Kathi Ludwig, Linward Natal G for evaluation of right elbow pain, initial evaluation was on September 17, 2018.  I have reviewed and summarized the referring note from the referring physician.  She has past medical history of hypertension, presently 11 months newborn, since July 2019, without any gross, she noticed right lateral elbow discomfort, initially happen at nighttime woke her up from sleep, deep achy pain, radiating discomfort along the right forearm extensor, she bought a new mattress, did not help her symptoms, now her right lateral elbow discomfort become constant, pressure, radiating tenderness along the right forearm when put pressure to right lateral epicondyle, occasionally involving the right dorsum first and second finger, no persistent sensory loss or weakness,  She also complains of left neck pain, radiating pain to left shoulder   Laboratory evaluations in July 2019, normal CMP, ESR was mildly elevated 25, normal CBC, hemoglobin of 12.4,   REVIEW OF SYSTEMS: Full 14 system review of systems performed and notable only for weight gain, feeling hot, joint pain, cramps, aching muscles, headaches, numbness, weakness, dizziness, snoring, restless leg All other review of systems were negative.  ALLERGIES: No Known Allergies  HOME MEDICATIONS: Current Outpatient Medications  Medication Sig Dispense Refill  . amLODipine (NORVASC) 5 MG tablet Take 5 mg by mouth daily.    Marland Kitchen ibuprofen  (ADVIL,MOTRIN) 600 MG tablet Take 1 tablet (600 mg total) every 6 (six) hours by mouth. 30 tablet 0  . valACYclovir (VALTREX) 500 MG tablet Take 500 mg by mouth as needed (fever blisters).     No current facility-administered medications for this visit.     PAST MEDICAL HISTORY: Past Medical History:  Diagnosis Date  . Anxiety    PP treated with counseling no meds  . Fibromyalgia   . GERD (gastroesophageal reflux disease)   . History of palpitations    negative work-up by cardiologist-- dr Eden Emms-- 2015  . Hypertension    taking labetalol  . MCTD (mixed connective tissue disease) [facility])    dx 07/ 2015--  rheumotologist--  dr Zenovia Jordan  . Nausea   . Numbness and tingling   . Wears contact lenses     PAST SURGICAL HISTORY: Past Surgical History:  Procedure Laterality Date  . DILATION AND EVACUATION N/A 06/17/2016   Procedure: DILATATION AND EVACUATION;  Surgeon: [name], DO;  Location: [facility];  Service: Gynecology;  Laterality: N/A;  WTIH SUCTION  . WISDOM TOOTH EXTRACTION  2012    FAMILY HISTORY: Family History  Problem Relation Age of Onset  . Hypertension Mother   . Hypertension Father   . Hypertension Maternal Grandmother     SOCIAL HISTORY: Social History   Socioeconomic History  . Marital status: Married    Spouse name: Not on file  . Number of children: 2  . Years of education: some college  . Highest education level: Not on file  Occupational History  . Occupation: Arts development officer  Social Needs  .  Financial resource strain: Not on file  . Food insecurity:    Worry: Not on file    Inability: Not on file  . Transportation needs:    Medical: Not on file    Non-medical: Not on file  Tobacco Use  . Smoking status: Never Smoker  . Smokeless tobacco: Never Used  Substance and Sexual Activity  . Alcohol use: Not on file    Comment: social settings  . Drug use: No  . Sexual activity: Yes  Lifestyle  . Physical activity:    Days  per week: Not on file    Minutes per session: Not on file  . Stress: Not on file  Relationships  . Social connections:    Talks on phone: Not on file    Gets together: Not on file    Attends religious service: Not on file    Active member of club or organization: Not on file    Attends meetings of clubs or organizations: Not on file    Relationship status: Not on file  . Intimate partner violence:    Fear of current or ex partner: Not on file    Emotionally abused: Not on file    Physically abused: Not on file    Forced sexual activity: Not on file  Other Topics Concern  . Not on file  Social History Narrative   Lives at home with husband and children.   Right-handed.   Caffeine use:  5 cups per day.     PHYSICAL EXAM   Vitals:   09/17/18 0925  BP: (!) 158/111  Pulse: 72  Weight: 210 lb (95.3 kg)  Height: 5\' 6"  (1.676 m)    Not recorded      Body mass index is 33.89 kg/m.  PHYSICAL EXAMNIATION:  Gen: NAD, conversant, well nourised, obese, well groomed                     Cardiovascular: Regular rate rhythm, no peripheral edema, warm, nontender. Eyes: Conjunctivae clear without exudates or hemorrhage Neck: Supple, no carotid bruits. Pulmonary: Clear to auscultation bilaterally   NEUROLOGICAL EXAM:  MENTAL STATUS: Speech:    Speech is normal; fluent and spontaneous with normal comprehension.  Cognition:     Orientation to time, place and person     Normal recent and remote memory     Normal Attention span and concentration     Normal Language, naming, repeating,spontaneous speech     Fund of knowledge   CRANIAL NERVES: CN II: Visual fields are full to confrontation. Fundoscopic exam is normal with sharp discs and no vascular changes. Pupils are round equal and briskly reactive to light. CN III, IV, VI: extraocular movement are normal. No ptosis. CN V: Facial sensation is intact to pinprick in all 3 divisions bilaterally. Corneal responses are intact.  CN  VII: Face is symmetric with normal eye closure and smile. CN VIII: Hearing is normal to rubbing fingers CN IX, X: Palate elevates symmetrically. Phonation is normal. CN XI: Head turning and shoulder shrug are intact CN XII: Tongue is midline with normal movements and no atrophy.  MOTOR: There is no pronator drift of out-stretched arms. Muscle bulk and tone are normal. Muscle strength is normal.  Mild tenderness of right lateral epicondyle upon deep palpitation  REFLEXES: Reflexes are 2+ and symmetric at the biceps, triceps, knees, and ankles. Plantar responses are flexor.  SENSORY: Intact to light touch, pinprick, positional sensation and vibratory sensation are intact in  fingers and toes.  COORDINATION: Rapid alternating movements and fine finger movements are intact. There is no dysmetria on finger-to-nose and heel-knee-shin.    GAIT/STANCE: Posture is normal. Gait is steady with normal steps, base, arm swing, and turning. Heel and toe walking are normal. Tandem gait is normal.  Romberg is absent.   DIAGNOSTIC DATA (LABS, IMAGING, TESTING) - I reviewed patient records, labs, notes, testing and imaging myself where available.   ASSESSMENT AND PLAN  Michelle Rojas is a 37 y.o. female   Right elbow discomfort  Most consistent with musculoskeletal etiology  I have suggested heating pad, massage, Aleve as needed   Levert Feinstein, M.D. Ph.D.  [facility] [address], [address] [place], [place] [address] Ph: [phone] Fax: [phone]  CC: Georgianne Fick, MD, Rossie Muskrat, MD

## 2018-10-09 ENCOUNTER — Telehealth: Payer: Self-pay | Admitting: Neurology

## 2018-10-09 DIAGNOSIS — M79601 Pain in right arm: Secondary | ICD-10-CM

## 2018-10-09 NOTE — Telephone Encounter (Signed)
Spoke to patient - she is aware to expect a call from orthopaedics to schedule an appt.

## 2018-10-09 NOTE — Telephone Encounter (Signed)
Ok, per vo by Dr. Terrace ArabiaYan, to place referral to orthopaedics.

## 2018-10-09 NOTE — Telephone Encounter (Signed)
Patient's right arm is worse and would like to be referred to an orthopedist.

## 2018-10-09 NOTE — Addendum Note (Signed)
Addended by: Lindell SparKIRKMAN, MICHELLE C on: 10/09/2018 03:16 PM   Modules accepted: Orders

## 2018-10-16 ENCOUNTER — Encounter (INDEPENDENT_AMBULATORY_CARE_PROVIDER_SITE_OTHER): Payer: Self-pay | Admitting: Orthopaedic Surgery

## 2018-10-16 ENCOUNTER — Ambulatory Visit (INDEPENDENT_AMBULATORY_CARE_PROVIDER_SITE_OTHER): Payer: 59 | Admitting: Orthopaedic Surgery

## 2018-10-16 ENCOUNTER — Ambulatory Visit (INDEPENDENT_AMBULATORY_CARE_PROVIDER_SITE_OTHER): Payer: 59

## 2018-10-16 DIAGNOSIS — M25521 Pain in right elbow: Secondary | ICD-10-CM

## 2018-10-16 NOTE — Addendum Note (Signed)
Addended by: Albertina ParrGARCIA, Gibril Mastro on: 10/16/2018 04:53 PM   Modules accepted: Orders

## 2018-10-16 NOTE — Progress Notes (Signed)
Office Visit Note   Patient: Michelle Rojas           Date of Birth: [birth year]           MRN: [id number] Visit Date: 10/16/2018              Requested by: Georgianne Fick, MD [address] [place], [place] [address] PCP: Georgianne Fick, MD   Assessment & Plan: Visit Diagnoses:  1. Pain in right elbow     Plan: Overall I think that her elbow is not the source of her pain.  I think she may either have a radiculopathy or carpal tunnel syndrome.  Therefore I would like to refer her to Dr. Ezzard Standing for nerve conduction study to better evaluate this.  I will see her back after the studies.  Follow-Up Instructions: Return if symptoms worsen or fail to improve.   Orders:  Orders Placed This Encounter  Procedures  . XR Elbow Complete Right (3+View)   No orders of the defined types were placed in this encounter.     Procedures: No procedures performed   Clinical Data: No additional findings.   Subjective: Chief Complaint  Patient presents with  . Right Arm - Pain  . 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  She has had an extensive work-up for autoimmune disorders.  She denies any injuries.  She states that the pain is most intense in the elbow and worse at rest.  She occasionally will have some numbness and tingling.  She does have some weakness secondary to the pain.  She has had courses of prednisone without any relief in the elbow pain.   Review of Systems  Constitutional: Negative.   HENT: Negative.   Eyes: Negative.   Respiratory: Negative.   Cardiovascular: Negative.   Endocrine: Negative.   Musculoskeletal: Negative.   Neurological: Negative.   Hematological: Negative.   Psychiatric/Behavioral: Negative.   All other systems reviewed and are negative.    Objective: Vital Signs: There were no vitals  taken for this visit.  Physical Exam  Constitutional: She is oriented to person, place, and time. She appears well-developed and well-nourished.  HENT:  Head: Normocephalic and atraumatic.  Eyes: EOM are normal.  Neck: Neck supple.  Pulmonary/Chest: Effort normal.  Abdominal: Soft.  Neurological: She is alert and oriented to person, place, and time.  Skin: Skin is warm. Capillary refill takes less than 2 seconds.  Psychiatric: She has a normal mood and affect. Her behavior is normal. Judgment and thought content normal.  Nursing note and vitals reviewed.   Ortho Exam Right elbow exam shows nose warmth or swelling.  There is no masses or lesions I can palpate.  She has full range of motion.  Triceps strength is intact.  Biceps strength is intact.  Radial head is nontender.  Mild tenderness over the olecranon. Specialty Comments:  No specialty comments available.  Imaging: Xr Elbow Complete Right (3+view)  Result Date: 10/16/2018 No acute or structural abnormalities.    PMFS History: Patient Active Problem List   Diagnosis Date Noted  . Right arm pain 09/17/2018  . SVD (spontaneous vaginal delivery) 09/23/2017  . HTN in pregnancy, chronic 09/22/2017  . Connective tissue disorder (HCC) 02/24/2015  . Dyspnea 12/13/2013  . Palpitations 12/13/2013  . Chest pain 12/13/2013  . Hypertension 08/26/2011  Past Medical History:  Diagnosis Date  . Anxiety    PP treated with counseling no meds  . Fibromyalgia   . GERD (gastroesophageal reflux disease)   . History of palpitations    negative work-up by cardiologist-- dr Eden Emms-- 2015  . Hypertension    taking labetalol  . MCTD (mixed connective tissue disease) [facility])    dx 07/ 2015--  rheumotologist--  dr Zenovia Jordan  . Nausea   . Numbness and tingling   . Wears contact lenses     Family History  Problem Relation Age of Onset  . Hypertension Mother   . Hypertension Father   . Hypertension Maternal Grandmother     Past  Surgical History:  Procedure Laterality Date  . DILATION AND EVACUATION N/A 06/17/2016   Procedure: DILATATION AND EVACUATION;  Surgeon: [name], DO;  Location: [facility];  Service: Gynecology;  Laterality: N/A;  WTIH SUCTION  . WISDOM TOOTH EXTRACTION  2012   Social History   Occupational History  . Occupation: Home maker  Tobacco Use  . Smoking status: Never Smoker  . Smokeless tobacco: Never Used  Substance and Sexual Activity  . Alcohol use: Not on file    Comment: social settings  . Drug use: No  . Sexual activity: Yes

## 2018-11-02 ENCOUNTER — Ambulatory Visit (INDEPENDENT_AMBULATORY_CARE_PROVIDER_SITE_OTHER): Payer: 59 | Admitting: Physical Medicine and Rehabilitation

## 2018-11-02 ENCOUNTER — Encounter (INDEPENDENT_AMBULATORY_CARE_PROVIDER_SITE_OTHER): Payer: Self-pay | Admitting: Physical Medicine and Rehabilitation

## 2018-11-02 DIAGNOSIS — R202 Paresthesia of skin: Secondary | ICD-10-CM | POA: Diagnosis not present

## 2018-11-02 NOTE — Progress Notes (Signed)
  Numeric Pain Rating Scale and Functional Assessment Average Pain 8   In the last MONTH (on 0-10 scale) has pain interfered with the following?  1. General activity like being  able to carry out your everyday physical activities such as walking, climbing stairs, carrying groceries, or moving a chair?  Rating(6)     

## 2018-11-02 NOTE — Procedures (Signed)
Impression: Essentially NORMAL electrodiagnostic study of the right upper limb.  There is no significant electrodiagnostic evidence of nerve entrapment, brachial plexopathy or cervical radiculopathy.    As you know, purely sensory or demyelinating radiculopathies and chemical radiculitis may not be detected with this particular electrodiagnostic study.  This electrodiagnostic study cannot rule out small fiber polyneuropathy and dysesthesias from central pain sensitization syndromes such as fibromyalgia.  Myotomal referral pain from trigger points is also not excluded.   Recommendations: 1.  Follow-up with referring physician. 2.  Continue current management of symptoms.  If this is felt to be radicular in nature then may want to obtain cervical MRI study.  ___________________________ Naaman Plummer FAAPMR Board Certified, American Board of Physical Medicine and Rehabilitation    Nerve Conduction Studies Anti Sensory Summary Table   Stim Site NR Peak (ms) Norm Peak (ms) P-T Amp (V) Norm P-T Amp Site1 Site2 Delta-P (ms) Dist (cm) Vel (m/s) Norm Vel (m/s)  Right Median Acr Palm Anti Sensory (2nd Digit)  33.5C  Wrist    3.0 <3.6 28.5 >10 Wrist Palm 1.2 0.0    Palm    1.8 <2.0 42.4         Right Radial Anti Sensory (Base 1st Digit)  33.1C  Wrist    2.1 <3.1 18.9  Wrist Base 1st Digit 2.1 0.0    Right Ulnar Anti Sensory (5th Digit)  33.3C  Wrist    3.0 <3.7 23.0 >15.0 Wrist 5th Digit 3.0 14.0 47 >38   Motor Summary Table   Stim Site NR Onset (ms) Norm Onset (ms) O-P Amp (mV) Norm O-P Amp Site1 Site2 Delta-0 (ms) Dist (cm) Vel (m/s) Norm Vel (m/s)  Right Median Motor (Abd Poll Brev)  33.1C  Wrist    2.7 <4.2 8.5 >5 Elbow Wrist 3.7 22.0 59 >50  Elbow    6.4  4.0         Right Ulnar Motor (Abd Dig Min)  33.1C  Wrist    2.5 <4.2 12.1 >3 B Elbow Wrist 3.2 20.0 63 >53  B Elbow    5.7  12.6  A Elbow B Elbow 1.1 10.0 91 >53  A Elbow    6.8  12.5          EMG   Side Muscle Nerve Root  Ins Act Fibs Psw Amp Dur Poly Recrt Int Dennie Bible Comment  Right Abd Poll Brev Median C8-T1 Nml Nml Nml Nml Nml 0 Nml Nml   Right 1stDorInt Ulnar C8-T1 Nml Nml Nml Nml Nml 0 Nml Nml   Right PronatorTeres Median C6-7 Nml Nml Nml Nml Nml 0 Nml Nml   Right Biceps Musculocut C5-6 Nml Nml Nml Nml Nml 0 Nml Nml   Right Deltoid Axillary C5-6 Nml Nml Nml Nml Nml 0 Nml Nml     Nerve Conduction Studies Anti Sensory Left/Right Comparison   Stim Site L Lat (ms) R Lat (ms) L-R Lat (ms) L Amp (V) R Amp (V) L-R Amp (%) Site1 Site2 L Vel (m/s) R Vel (m/s) L-R Vel (m/s)  Median Acr Palm Anti Sensory (2nd Digit)  33.5C  Wrist  3.0   28.5  Wrist Palm     Palm  1.8   42.4        Radial Anti Sensory (Base 1st Digit)  33.1C  Wrist  2.1   18.9  Wrist Base 1st Digit     Ulnar Anti Sensory (5th Digit)  33.3C  Wrist  3.0   23.0  Wrist  5th Digit  47    Motor Left/Right Comparison   Stim Site L Lat (ms) R Lat (ms) L-R Lat (ms) L Amp (mV) R Amp (mV) L-R Amp (%) Site1 Site2 L Vel (m/s) R Vel (m/s) L-R Vel (m/s)  Median Motor (Abd Poll Brev)  33.1C  Wrist  2.7   8.5  Elbow Wrist  59   Elbow  6.4   4.0        Ulnar Motor (Abd Dig Min)  33.1C  Wrist  2.5   12.1  B Elbow Wrist  63   B Elbow  5.7   12.6  A Elbow B Elbow  91   A Elbow  6.8   12.5           Waveforms:

## 2018-11-02 NOTE — Progress Notes (Signed)
Michelle Rojas - 37 y.o. female MRN [id number]  Date of birth: Jun 12, 1981  Office Visit Note: Visit Date: 11/02/2018 PCP: Georgianne Fick, MD Referred by: Georgianne Fick, MD  Subjective: Chief Complaint  Patient presents with  . Right Shoulder - Pain, Numbness, Tingling  . Left Shoulder - Numbness, Pain, Tingling  . Right Elbow - Numbness, Pain, Tingling  . Left Elbow - Pain, Numbness, Tingling  . Right Arm - Numbness, Pain, Tingling  . Right Hand - Numbness, Pain, Tingling   HPI: Michelle Rojas is a 37 y.o. female who comes in today For electrodiagnostic study of the right upper limb as requested by Dr. Glee Arvin.  Patient is right-hand dominant and reports approximately 1 year of ongoing symptoms that seem to worsen after she delivered her baby.  She reports pain particularly in the right elbow referral pain up into the right shoulder with pain and numbness also into the right hand and in particular the thumb and index finger.  She reports that laying down actually makes it worse and nothing seems to help.  She is very frustrated with not being able to find a solution to her pain at this point.  She reports that nobody can really give her any medication that will help.  Interestingly she was seen at [facility] neurology by Dr. Levert Feinstein who felt like this was more of a orthopedic or musculoskeletal problem and not neurological.  She did not obtain electrodiagnostic study.  Patient has not had advanced imaging of the cervical spine.  ROS Otherwise per HPI.  Assessment & Plan: Visit Diagnoses:  1. Paresthesia of skin     Plan: Impression: Essentially NORMAL electrodiagnostic study of the right upper limb.  There is no significant electrodiagnostic evidence of nerve entrapment, brachial plexopathy or cervical radiculopathy.    As you know, purely sensory or demyelinating radiculopathies and chemical radiculitis may not be detected with this particular electrodiagnostic  study.  This electrodiagnostic study cannot rule out small fiber polyneuropathy and dysesthesias from central pain sensitization syndromes such as fibromyalgia.  Myotomal referral pain from trigger points is also not excluded.   Recommendations: 1.  Follow-up with referring physician. 2.  Continue current management of symptoms.  If this is felt to be radicular in nature then may want to obtain cervical MRI study.   Meds & Orders: No orders of the defined types were placed in this encounter.   Orders Placed This Encounter  Procedures  . NCV with EMG (electromyography)    Follow-up: Return for  Glee Arvin, M.D..   Procedures:  Impression: Essentially NORMAL electrodiagnostic study of the right upper limb.  There is no significant electrodiagnostic evidence of nerve entrapment, brachial plexopathy or cervical radiculopathy.    As you know, purely sensory or demyelinating radiculopathies and chemical radiculitis may not be detected with this particular electrodiagnostic study.  This electrodiagnostic study cannot rule out small fiber polyneuropathy and dysesthesias from central pain sensitization syndromes such as fibromyalgia.  Myotomal referral pain from trigger points is also not excluded.   Recommendations: 1.  Follow-up with referring physician. 2.  Continue current management of symptoms.  If this is felt to be radicular in nature then may want to obtain cervical MRI study.  ___________________________ Elease Hashimoto Board Certified, American Board of Physical Medicine and Rehabilitation    Nerve Conduction Studies Anti Sensory Summary Table   Stim Site NR Peak (ms) Norm Peak (ms) P-T Amp (V) Norm P-T Amp Site1 Site2 Delta-P (ms)  Dist (cm) Vel (m/s) Norm Vel (m/s)  Right Median Acr Palm Anti Sensory (2nd Digit)  33.5C  Wrist    3.0 <3.6 28.5 >10 Wrist Palm 1.2 0.0    Palm    1.8 <2.0 42.4         Right Radial Anti Sensory (Base 1st Digit)  33.1C  Wrist    2.1  <3.1 18.9  Wrist Base 1st Digit 2.1 0.0    Right Ulnar Anti Sensory (5th Digit)  33.3C  Wrist    3.0 <3.7 23.0 >15.0 Wrist 5th Digit 3.0 14.0 47 >38   Motor Summary Table   Stim Site NR Onset (ms) Norm Onset (ms) O-P Amp (mV) Norm O-P Amp Site1 Site2 Delta-0 (ms) Dist (cm) Vel (m/s) Norm Vel (m/s)  Right Median Motor (Abd Poll Brev)  33.1C  Wrist    2.7 <4.2 8.5 >5 Elbow Wrist 3.7 22.0 59 >50  Elbow    6.4  4.0         Right Ulnar Motor (Abd Dig Min)  33.1C  Wrist    2.5 <4.2 12.1 >3 B Elbow Wrist 3.2 20.0 63 >53  B Elbow    5.7  12.6  A Elbow B Elbow 1.1 10.0 91 >53  A Elbow    6.8  12.5          EMG   Side Muscle Nerve Root Ins Act Fibs Psw Amp Dur Poly Recrt Int Dennie Bible Comment  Right Abd Poll Brev Median C8-T1 Nml Nml Nml Nml Nml 0 Nml Nml   Right 1stDorInt Ulnar C8-T1 Nml Nml Nml Nml Nml 0 Nml Nml   Right PronatorTeres Median C6-7 Nml Nml Nml Nml Nml 0 Nml Nml   Right Biceps Musculocut C5-6 Nml Nml Nml Nml Nml 0 Nml Nml   Right Deltoid Axillary C5-6 Nml Nml Nml Nml Nml 0 Nml Nml     Nerve Conduction Studies Anti Sensory Left/Right Comparison   Stim Site L Lat (ms) R Lat (ms) L-R Lat (ms) L Amp (V) R Amp (V) L-R Amp (%) Site1 Site2 L Vel (m/s) R Vel (m/s) L-R Vel (m/s)  Median Acr Palm Anti Sensory (2nd Digit)  33.5C  Wrist  3.0   28.5  Wrist Palm     Palm  1.8   42.4        Radial Anti Sensory (Base 1st Digit)  33.1C  Wrist  2.1   18.9  Wrist Base 1st Digit     Ulnar Anti Sensory (5th Digit)  33.3C  Wrist  3.0   23.0  Wrist 5th Digit  47    Motor Left/Right Comparison   Stim Site L Lat (ms) R Lat (ms) L-R Lat (ms) L Amp (mV) R Amp (mV) L-R Amp (%) Site1 Site2 L Vel (m/s) R Vel (m/s) L-R Vel (m/s)  Median Motor (Abd Poll Brev)  33.1C  Wrist  2.7   8.5  Elbow Wrist  59   Elbow  6.4   4.0        Ulnar Motor (Abd Dig Min)  33.1C  Wrist  2.5   12.1  B Elbow Wrist  63   B Elbow  5.7   12.6  A Elbow B Elbow  91   A Elbow  6.8   12.5           Waveforms:             Clinical History: No specialty comments available.   She reports that  she has never smoked. She has never used smokeless tobacco. No results for input(s): HGBA1C, LABURIC in the last 8760 hours.  Objective:  VS:  HT:    WT:   BMI:     BP:   HR: bpm  TEMP: ( )  RESP:  Physical Exam Musculoskeletal:        General: Tenderness present. No swelling or deformity.     Comments: Inspection reveals no atrophy of the bilateral APB or FDI or hand intrinsics. There is no swelling, color changes, allodynia or dystrophic changes. There is 5 out of 5 strength in the bilateral wrist extension, finger abduction and long finger flexion.  There is subjective impaired sensation to light touch and perhaps more of a C6 distribution over the thumb and index finger on the right.   There is a negative Hoffmann's test bilaterally.  Skin:    General: Skin is warm and dry.     Findings: No erythema or rash.  Neurological:     General: No focal deficit present.     Mental Status: She is alert and oriented to person, place, and time.     Motor: No weakness or abnormal muscle tone.     Coordination: Coordination normal.  Psychiatric:        Mood and Affect: Mood normal.        Behavior: Behavior normal.     Ortho Exam Imaging: No results found.  Past Medical/Family/Surgical/Social History: Medications & Allergies reviewed per EMR, new medications updated. Patient Active Problem List   Diagnosis Date Noted  . Right arm pain 09/17/2018  . SVD (spontaneous vaginal delivery) 09/23/2017  . HTN in pregnancy, chronic 09/22/2017  . Connective tissue disorder (HCC) 02/24/2015  . Dyspnea 12/13/2013  . Palpitations 12/13/2013  . Chest pain 12/13/2013  . Hypertension 08/26/2011   Past Medical History:  Diagnosis Date  . Anxiety    PP treated with counseling no meds  . Fibromyalgia   . GERD (gastroesophageal reflux disease)   . History of palpitations    negative work-up by cardiologist-- dr  Eden Emms-- 2015  . Hypertension    taking labetalol  . MCTD (mixed [facility] tissue disease) [facility])    dx 07/ 2015--  rheumotologist--  dr Zenovia Jordan  . Nausea   . Numbness and tingling   . Wears contact lenses    Family History  Problem Relation Age of Onset  . Hypertension Mother   . Hypertension Father   . Hypertension Maternal Grandmother    Past Surgical History:  Procedure Laterality Date  . DILATION AND EVACUATION N/A 06/17/2016   Procedure: DILATATION AND EVACUATION;  Surgeon: [name], DO;  Location: [facility];  Service: Gynecology;  Laterality: N/A;  WTIH SUCTION  . WISDOM TOOTH EXTRACTION  2012   Social History   Occupational History  . Occupation: Home maker  Tobacco Use  . Smoking status: Never Smoker  . Smokeless tobacco: Never Used  Substance and Sexual Activity  . Alcohol use: Not on file    Comment: social settings  . Drug use: No  . Sexual activity: Yes

## 2018-11-06 ENCOUNTER — Ambulatory Visit (INDEPENDENT_AMBULATORY_CARE_PROVIDER_SITE_OTHER): Payer: 59 | Admitting: Orthopaedic Surgery

## 2018-11-06 ENCOUNTER — Ambulatory Visit (INDEPENDENT_AMBULATORY_CARE_PROVIDER_SITE_OTHER): Payer: 59

## 2018-11-06 DIAGNOSIS — M5412 Radiculopathy, cervical region: Secondary | ICD-10-CM

## 2018-11-06 DIAGNOSIS — M542 Cervicalgia: Secondary | ICD-10-CM

## 2018-11-06 MED ORDER — TRAMADOL HCL 50 MG PO TABS
50.0000 mg | ORAL_TABLET | Freq: Every day | ORAL | 2 refills | Status: DC | PRN
Start: 2018-11-06 — End: 2023-11-28

## 2018-11-06 NOTE — Addendum Note (Signed)
Addended by: Albertina ParrGARCIA, Briauna Gilmartin on: 11/06/2018 04:28 PM   Modules accepted: Orders

## 2018-11-06 NOTE — Progress Notes (Signed)
Office Visit Note   Patient: Michelle Rojas           Date of Birth: [birth year]           MRN: [id number] Visit Date: 11/06/2018              Requested by: Georgianne Fick, MD [address] [place], [place] [address] PCP: Georgianne Fick, MD   Assessment & Plan: Visit Diagnoses:  1. Cervical radiculopathy     Plan: X-rays do not show any structural abnormalities.  However I am still concerned that she may have cervical radiculopathy therefore I will order MRI of the cervical spine to fully evaluate for this.  In the meantime I have given her prescription for tramadol.  I will see her back in the next week to 2 weeks to review the MRI.  Follow-Up Instructions: Return in about 10 days (around 11/16/2018).   Orders:  Orders Placed This Encounter  Procedures  . XR Cervical Spine 2 or 3 views   Meds ordered this encounter  Medications  . traMADol (ULTRAM) 50 MG tablet    Sig: Take 1-2 tablets (50-100 mg total) by mouth daily as needed.    Dispense:  30 tablet    Refill:  2      Procedures: No procedures performed   Clinical Data: No additional findings.   Subjective: Chief Complaint  Patient presents with  . Right Elbow - Pain, Follow-up    Ms. Keng comes in today for continued symptoms of her right upper extremity.  Her nerve conduction studies were negative for carpal tunnel syndrome and radiculopathy.  She continues to have pain that radiates from her shoulder down into her index finger.   Review of Systems   Objective: Vital Signs: There were no vitals taken for this visit.  Physical Exam  Ortho Exam Cervical spine exam shows mild tenderness of the spinous processes.  Positive Spurling sign. Specialty Comments:  No specialty comments available.  Imaging: No results found.   PMFS History: Patient Active Problem List   Diagnosis Date Noted  . Right arm pain 09/17/2018  . SVD (spontaneous vaginal delivery) 09/23/2017    . HTN in pregnancy, chronic 09/22/2017  . Connective tissue disorder (HCC) 02/24/2015  . Dyspnea 12/13/2013  . Palpitations 12/13/2013  . Chest pain 12/13/2013  . Hypertension 08/26/2011   Past Medical History:  Diagnosis Date  . Anxiety    PP treated with counseling no meds  . Fibromyalgia   . GERD (gastroesophageal reflux disease)   . History of palpitations    negative work-up by cardiologist-- dr Eden Emms-- 2015  . Hypertension    taking labetalol  . MCTD (mixed connective tissue disease) [facility])    dx 07/ 2015--  rheumotologist--  dr Zenovia Jordan  . Nausea   . Numbness and tingling   . Wears contact lenses     Family History  Problem Relation Age of Onset  . Hypertension Mother   . Hypertension Father   . Hypertension Maternal Grandmother     Past Surgical History:  Procedure Laterality Date  . DILATION AND EVACUATION N/A 06/17/2016   Procedure: DILATATION AND EVACUATION;  Surgeon: [name], DO;  Location: [facility] ;  Service: Gynecology;  Laterality: N/A;  WTIH SUCTION  . WISDOM TOOTH EXTRACTION  2012   Social History   Occupational History  . Occupation: Home maker  Tobacco Use  . Smoking status: Never Smoker  . Smokeless tobacco: Never Used  Substance and Sexual Activity  . Alcohol use: Not on file    Comment: social settings  . Drug use: No  . Sexual activity: Yes

## 2018-11-15 ENCOUNTER — Telehealth (INDEPENDENT_AMBULATORY_CARE_PROVIDER_SITE_OTHER): Payer: Self-pay | Admitting: Orthopaedic Surgery

## 2018-11-15 NOTE — Telephone Encounter (Signed)
MRI ordered 12/17, she still hasn't heard anything thing. Please call patient with status [phone]

## 2018-11-16 ENCOUNTER — Ambulatory Visit (INDEPENDENT_AMBULATORY_CARE_PROVIDER_SITE_OTHER): Payer: 59 | Admitting: Orthopaedic Surgery

## 2018-11-16 NOTE — Telephone Encounter (Signed)
IC pt left vm to return call

## 2018-11-16 NOTE — Telephone Encounter (Signed)
Pt called back and we discussed the issue with her insurance co I informed her that I received fax stating is needing more informatin to back up reason for MRI. I advised her I had sent over the information this am and should hear something by end of business day Monday.

## 2018-11-19 NOTE — Telephone Encounter (Signed)
Received authorization from [employer]. I called pt and advised her of this.

## 2018-11-25 ENCOUNTER — Ambulatory Visit
Admission: RE | Admit: 2018-11-25 | Discharge: 2018-11-25 | Disposition: A | Discharge status: Home / Self Care | Payer: 59 | Source: Ambulatory Visit | Attending: Orthopaedic Surgery | Admitting: Orthopaedic Surgery

## 2018-11-25 DIAGNOSIS — M542 Cervicalgia: Secondary | ICD-10-CM

## 2018-11-28 ENCOUNTER — Ambulatory Visit (INDEPENDENT_AMBULATORY_CARE_PROVIDER_SITE_OTHER): Payer: Self-pay | Admitting: Orthopaedic Surgery

## 2018-12-04 ENCOUNTER — Encounter (INDEPENDENT_AMBULATORY_CARE_PROVIDER_SITE_OTHER): Payer: Self-pay | Admitting: Orthopaedic Surgery

## 2018-12-04 ENCOUNTER — Ambulatory Visit (INDEPENDENT_AMBULATORY_CARE_PROVIDER_SITE_OTHER): Payer: 59 | Admitting: Orthopaedic Surgery

## 2018-12-04 DIAGNOSIS — M5412 Radiculopathy, cervical region: Secondary | ICD-10-CM

## 2018-12-04 NOTE — Progress Notes (Signed)
Office Visit Note   Patient: Michelle Rojas           Date of Birth: May 27, 1981           MRN: [id number] Visit Date: 12/04/2018              Requested by: Georgianne Fick, MD [address] [place], [place] [address] PCP: Georgianne Fick, MD   Assessment & Plan: Visit Diagnoses:  1. Cervical radiculopathy     Plan: MRI of the cervical spine shows minimal uncinate spurring at several levels.  These findings cannot explain her constellation of symptoms.  I believe that her symptoms are more related to her underlying mixed connective tissue disorder.  I did briefly discuss a potential cervical spine ESI with her but I think this is low yield should she decide to have 1 we would certainly make a referral to Dr. Alvester Morin for this.  Otherwise I defer her continued symptomatology to her PCP versus rheumatologist.  We will see her back as needed.  Follow-Up Instructions: Return if symptoms worsen or fail to improve.   Orders:  Orders Placed This Encounter  Procedures  . Ambulatory referral to Physical Medicine Rehab   No orders of the defined types were placed in this encounter.     Procedures: No procedures performed   Clinical Data: No additional findings.   Subjective: Chief Complaint  Patient presents with  . Neck - Pain  . Results    Michelle Rojas returns today for review of her cervical spine MRI.  She complains of constant neck and bilateral shoulder and bilateral elbow pain.   Review of Systems   Objective: Vital Signs: There were no vitals taken for this visit.  Physical Exam  Ortho Exam Musculoskeletal exam is nonfocal. Specialty Comments:  No specialty comments available.  Imaging: No results found.   PMFS History: Patient Active Problem List   Diagnosis Date Noted  . Right arm pain 09/17/2018  . SVD (spontaneous vaginal delivery) 09/23/2017  . HTN in pregnancy, chronic 09/22/2017  . Connective tissue disorder (HCC)  02/24/2015  . Dyspnea 12/13/2013  . Palpitations 12/13/2013  . Chest pain 12/13/2013  . Hypertension 08/26/2011   Past Medical History:  Diagnosis Date  . Anxiety    PP treated with counseling no meds  . Fibromyalgia   . GERD (gastroesophageal reflux disease)   . History of palpitations    negative work-up by cardiologist-- dr Eden Emms-- 2015  . Hypertension    taking labetalol  . MCTD (mixed connective tissue disease) [facility])    dx 07/ 2015--  rheumotologist--  dr Zenovia Jordan  . Nausea   . Numbness and tingling   . Wears contact lenses     Family History  Problem Relation Age of Onset  . Hypertension Mother   . Hypertension Father   . Hypertension Maternal Grandmother     Past Surgical History:  Procedure Laterality Date  . DILATION AND EVACUATION N/A 06/17/2016   Procedure: DILATATION AND EVACUATION;  Surgeon: [name], DO;  Location: [facility];  Service: Gynecology;  Laterality: N/A;  WTIH SUCTION  . WISDOM TOOTH EXTRACTION  2012   Social History   Occupational History  . Occupation: Home maker  Tobacco Use  . Smoking status: Never Smoker  . Smokeless tobacco: Never Used  Substance and Sexual Activity  . Alcohol use: Not on file    Comment: social settings  . Drug use: No  . Sexual activity: Yes

## 2018-12-26 ENCOUNTER — Telehealth (INDEPENDENT_AMBULATORY_CARE_PROVIDER_SITE_OTHER): Payer: Self-pay | Admitting: Orthopaedic Surgery

## 2018-12-26 NOTE — Telephone Encounter (Signed)
Okay for referral?

## 2018-12-26 NOTE — Telephone Encounter (Signed)
Patient called to request a referral to Dr. Corliss Skains.  CB#[phone].  Thank you.

## 2018-12-26 NOTE — Telephone Encounter (Signed)
yes

## 2018-12-27 ENCOUNTER — Other Ambulatory Visit (INDEPENDENT_AMBULATORY_CARE_PROVIDER_SITE_OTHER): Payer: Self-pay

## 2018-12-27 DIAGNOSIS — M542 Cervicalgia: Secondary | ICD-10-CM

## 2018-12-27 NOTE — Telephone Encounter (Signed)
Sent referral to Dr Corliss Skains

## 2019-01-15 ENCOUNTER — Ambulatory Visit (INDEPENDENT_AMBULATORY_CARE_PROVIDER_SITE_OTHER): Payer: Self-pay | Admitting: Physical Medicine and Rehabilitation

## 2019-01-23 ENCOUNTER — Ambulatory Visit (INDEPENDENT_AMBULATORY_CARE_PROVIDER_SITE_OTHER): Payer: Self-pay | Admitting: Physical Medicine and Rehabilitation

## 2019-01-24 NOTE — Progress Notes (Signed)
Office Visit Note  Patient: Michelle Rojas             Date of Birth: 07-Aug-1981           MRN: [id number]             PCP: Georgianne Fick, MD Referring: Tarry Kos, MD Visit Date: 02/07/2019 Occupation: Unemployed  Subjective:  Generalized pain.Marland Kitchen   History of Present Illness: Michelle Rojas is a 38 y.o. female consultation per request of Dr. Roda Shutters.  Started about 3 years ago with generalized pain in her extremities.  She states that the pain was in her shoulders and she went to her PCP where her ANA came positive.  She was seen by Dr. Dierdre Forth who was the rheumatologist in the practice he diagnosed her with mixed connective tissue disease.  She was tried on Plaquenil and prednisone for a while.  She states the prednisone was discontinued due to mood changes.  She had an adequate response to Plaquenil but she is still continued the medication.  She states that Dr. Dierdre Forth left from the practice and Dr. Kathi Ludwig took over her care.  She did not agree with the diagnosis of mixed connective tissue disease and diagnosed her with fibromyalgia syndrome.  She was treated with combination of gabapentin and tramadol which was very helpful.  After that she got pregnant and miscarried and got pregnant again.  She states now she is 16 months postpartum.  During the pregnancy she did not need any medications and did well.  In January 2019 she started experiencing increased pain all over.  She was also having lot of edema which responded to hydrochlorothiazide.  She states the pain persist and all her labs were negative per Dr. Kathi Ludwig.  She was seen by Dr. Dierdre Forth again who started her on IV Benlysta based on the diagnosis of mixed connective tissue disease.  She started [facility] in October 2019 and continued until now.  She states she was also referred to neurologist due to ongoing pain and discomfort.  She recalls having EMG, nerve conduction velocity, x-ray of her cervical spine, MRI of her cervical spine  which were all within normal limits.  Dr. Dierdre Forth felt that her symptoms were related to fibromyalgia syndrome.  Although Dr. Kathi Ludwig felt that her symptoms were not related to fibromyalgia syndrome.  She states her pain is not under control.  She has been taking Tylenol ibuprofen and Benlysta so far.  She has been taking tramadol occasionally which is not controlling her symptoms.  She has generalized pain all over although the pain is worse in her right arm.  She complains of pain in her hands and her wrist.  He notices swelling in her hands at times.  She was referred to Dr. Roda Shutters by neurology but the work-up was negative.  Activities of Daily Living:  Patient reports morning stiffness for several hours.   Patient Reports nocturnal pain.  Difficulty dressing/grooming: Reports Difficulty climbing stairs: Reports Difficulty getting out of chair: Reports Difficulty using hands for taps, buttons, cutlery, and/or writing: Reports  Review of Systems  Constitutional: Positive for fatigue. Negative for night sweats, weight gain and weight loss.  HENT: Positive for mouth dryness and nose dryness. Negative for mouth sores, trouble swallowing and trouble swallowing.   Eyes: Positive for dryness. Negative for pain, redness, itching and visual disturbance.  Respiratory: Negative for cough, shortness of breath, wheezing and difficulty breathing.   Cardiovascular: Negative for chest pain, palpitations, hypertension, irregular  heartbeat and swelling in legs/feet.  Gastrointestinal: Negative for abdominal pain, blood in stool, constipation and diarrhea.  Endocrine: Negative for increased urination.  Genitourinary: Negative for painful urination, pelvic pain and vaginal dryness.  Musculoskeletal: Positive for arthralgias, joint pain, joint swelling and morning stiffness. Negative for myalgias, muscle weakness, muscle tenderness and myalgias.  Skin: Positive for color change and rash. Negative for hair loss, redness,  skin tightness, ulcers and sensitivity to sunlight.       Cold temperature makes her fingers cold.  She has occasional rash on her extremities and face.  Allergic/Immunologic: Negative for susceptible to infections.  Neurological: Positive for dizziness, headaches and weakness. Negative for light-headedness, memory loss and night sweats.  Hematological: Negative for swollen glands.  Psychiatric/Behavioral: Positive for sleep disturbance. Negative for depressed mood and confusion. The patient is not nervous/anxious.     PMFS History:  Patient Active Problem List   Diagnosis Date Noted  . History of anxiety 02/07/2019  . Right arm pain 09/17/2018  . SVD (spontaneous vaginal delivery) 09/23/2017  . HTN in pregnancy, chronic 09/22/2017  . Connective tissue disorder (HCC) 02/24/2015  . Dyspnea 12/13/2013  . Palpitations 12/13/2013  . Chest pain 12/13/2013  . Hypertension 08/26/2011    Past Medical History:  Diagnosis Date  . Anxiety    PP treated with counseling no meds  . Fibromyalgia   . GERD (gastroesophageal reflux disease)   . History of palpitations    negative work-up by cardiologist-- dr Eden Emms-- 2015  . Hypertension    taking labetalol  . MCTD (mixed connective tissue disease) [facility])    dx 07/ 2015--  rheumotologist--  dr Zenovia Jordan  . Nausea   . Numbness and tingling   . Wears contact lenses     Family History  Problem Relation Age of Onset  . Hypertension Mother   . Hypertension Father   . Hypertension Maternal Grandmother   . Healthy Brother   . Healthy Brother   . Healthy Daughter   . Healthy Daughter    Past Surgical History:  Procedure Laterality Date  . DILATION AND EVACUATION N/A 06/17/2016   Procedure: DILATATION AND EVACUATION;  Surgeon: [name], DO;  Location: [facility] [place];  Service: Gynecology;  Laterality: N/A;  WTIH SUCTION  . WISDOM TOOTH EXTRACTION  2012   Social History   Social History Narrative   Lives at home with  husband and children.   Right-handed.   Caffeine use:  5 cups per day.   There is no immunization history for the selected administration types on file for this patient.   Objective: Vital Signs: BP (!) 183/116 (BP Location: Right Arm, Patient Position: Sitting, Cuff Size: Normal)   Pulse 75   Resp 13   Ht 5' 6.5" (1.689 m)   Wt 201 lb (91.2 kg)   BMI 31.96 kg/m    Physical Exam Vitals signs and nursing note reviewed.  Constitutional:      Appearance: She is well-developed.  HENT:     Head: Normocephalic and atraumatic.  Eyes:     Conjunctiva/sclera: Conjunctivae normal.  Neck:     Musculoskeletal: Normal range of motion.  Cardiovascular:     Rate and Rhythm: Normal rate and regular rhythm.     Heart sounds: Normal heart sounds.  Pulmonary:     Effort: Pulmonary effort is normal.     Breath sounds: Normal breath sounds.  Abdominal:     General: Bowel sounds are normal.  Palpations: Abdomen is soft.  Lymphadenopathy:     Cervical: No cervical adenopathy.  Skin:    General: Skin is warm and dry.     Capillary Refill: Capillary refill takes less than 2 seconds.  Neurological:     Mental Status: She is alert and oriented to person, place, and time.  Psychiatric:        Behavior: Behavior normal.      Musculoskeletal Exam: C-spine thoracic and lumbar spine good range of motion.  Shoulder joints elbow joints wrist joint MCPs PIPs DIPs been good range of motion with no synovitis.  Hip joints knee joints ankles MTPs PIPs and DIPs with good range of motion with no synovitis.  She had 4 tender points.  CDAI Exam: CDAI Score: Not documented Patient Global Assessment: Not documented; Provider Global Assessment: Not documented Swollen: Not documented; Tender: Not documented Joint Exam   Not documented   There is currently no information documented on the homunculus. Go to the Rheumatology activity and complete the homunculus joint exam.  Investigation: No additional  findings.  Imaging: No results found.  Recent Labs: Lab Results  Component Value Date   WBC 11.7 (H) 09/23/2017   HGB 10.1 (L) 09/23/2017   PLT 284 09/23/2017   NA 140 09/02/2017   K 3.1 (L) 09/02/2017   CL 107 09/02/2017   CO2 24 09/02/2017   GLUCOSE 73 09/02/2017   BUN <5 (L) 09/02/2017   CREATININE 0.64 09/02/2017   BILITOT 0.7 09/02/2017   ALKPHOS 427 (H) 09/02/2017   AST 28 09/02/2017   ALT 17 09/02/2017   PROT 6.9 09/02/2017   ALBUMIN 3.2 (L) 09/02/2017   CALCIUM 9.2 09/02/2017   GFRAA >60 09/02/2017    Speciality Comments: No specialty comments available.  Procedures:  No procedures performed Allergies: Patient has no known allergies.   Assessment / Plan:     Visit Diagnoses: Connective tissue disorder (HCC)-patient was diagnosed with a connective tissue disease by Dr. Dierdre Forth and has been on Benlysta subcu injections.  Patient has tried Plaquenil and prednisone in the past and did not respond well with that.  She wants a second opinion regarding her diagnosis.  She wants to be certain about her diagnosis of autoimmune disease.  I will obtainAVISE labs today.  After giving it a thought she decided not to have AVISE labs and she will continue care with Dr. Dierdre Forth.  She stated she wanted one physician who can give her Benlysta and also will prescribe her pain medications.  Neck pain-she continues to have neck pain and stiffness.  She had extensive neurological work-up which was negative.  Fibromyalgia-she has generalized pain discomfort and positive tender points.  Patient states she gets tramadol sparingly and that is not controlling her symptoms.  I advised her to discuss possible referral to pain management.  Patient states she had done really well with gabapentin and tramadol combination in the past.  Essential hypertension-her blood pressure was very elevated today.  She was advised to monitor blood pressure closely and follow-up with her PCP.  History of anxiety  -patient states her anxiety is much better now.  She has anxiety related to her medical condition.  Orders: No orders of the defined types were placed in this encounter.  No orders of the defined types were placed in this encounter.   Face-to-face time spent with patient was 45 minutes. Greater than 50% of time was spent in counseling and coordination of care.  Follow-Up Instructions: Return if symptoms worsen  or fail to improve, for Possible connective tissue disease.   Pollyann Savoy, MD  Note - This record has been created using Animal nutritionist.  Chart creation errors have been sought, but may not always  have been located. Such creation errors do not reflect on  the standard of medical care.

## 2019-02-07 ENCOUNTER — Other Ambulatory Visit: Payer: Self-pay

## 2019-02-07 ENCOUNTER — Encounter: Payer: Self-pay | Admitting: Rheumatology

## 2019-02-07 ENCOUNTER — Ambulatory Visit (INDEPENDENT_AMBULATORY_CARE_PROVIDER_SITE_OTHER): Payer: 59 | Admitting: Rheumatology

## 2019-02-07 VITALS — BP 183/116 | HR 75 | Resp 13 | Ht 66.5 in | Wt 201.0 lb

## 2019-02-07 DIAGNOSIS — M359 Systemic involvement of connective tissue, unspecified: Secondary | ICD-10-CM | POA: Diagnosis not present

## 2019-02-07 DIAGNOSIS — M797 Fibromyalgia: Secondary | ICD-10-CM | POA: Diagnosis not present

## 2019-02-07 DIAGNOSIS — Z8659 Personal history of other mental and behavioral disorders: Secondary | ICD-10-CM | POA: Insufficient documentation

## 2019-02-07 DIAGNOSIS — M542 Cervicalgia: Secondary | ICD-10-CM

## 2019-02-07 DIAGNOSIS — I10 Essential (primary) hypertension: Secondary | ICD-10-CM

## 2019-02-26 ENCOUNTER — Ambulatory Visit: Payer: 59 | Admitting: Rheumatology

## 2019-05-13 ENCOUNTER — Other Ambulatory Visit: Payer: Self-pay | Admitting: Obstetrics & Gynecology

## 2019-05-13 DIAGNOSIS — R928 Other abnormal and inconclusive findings on diagnostic imaging of breast: Secondary | ICD-10-CM

## 2019-05-15 ENCOUNTER — Other Ambulatory Visit: Payer: 59

## 2019-07-11 ENCOUNTER — Ambulatory Visit: Payer: 59

## 2019-07-11 ENCOUNTER — Ambulatory Visit
Admission: RE | Admit: 2019-07-11 | Discharge: 2019-07-11 | Disposition: A | Discharge status: Home / Self Care | Payer: 59 | Source: Ambulatory Visit | Attending: Obstetrics & Gynecology | Admitting: Obstetrics & Gynecology

## 2019-07-11 ENCOUNTER — Other Ambulatory Visit: Payer: Self-pay

## 2019-07-11 DIAGNOSIS — R928 Other abnormal and inconclusive findings on diagnostic imaging of breast: Secondary | ICD-10-CM

## 2021-12-13 ENCOUNTER — Emergency Department (HOSPITAL_COMMUNITY)
Admission: EM | Admit: 2021-12-13 | Discharge: 2021-12-13 | Disposition: A | Discharge status: Home / Self Care | Payer: 59 | Attending: Emergency Medicine | Admitting: Emergency Medicine

## 2021-12-13 ENCOUNTER — Encounter (HOSPITAL_COMMUNITY): Payer: Self-pay

## 2021-12-13 ENCOUNTER — Emergency Department (HOSPITAL_COMMUNITY): Payer: 59

## 2021-12-13 DIAGNOSIS — R42 Dizziness and giddiness: Secondary | ICD-10-CM | POA: Diagnosis not present

## 2021-12-13 DIAGNOSIS — R079 Chest pain, unspecified: Secondary | ICD-10-CM | POA: Diagnosis present

## 2021-12-13 DIAGNOSIS — I10 Essential (primary) hypertension: Secondary | ICD-10-CM | POA: Insufficient documentation

## 2021-12-13 LAB — BASIC METABOLIC PANEL
Anion gap: 5 (ref 5–15)
BUN: 10 mg/dL (ref 6–20)
CO2: 26 mmol/L (ref 22–32)
Calcium: 8.8 mg/dL — ABNORMAL LOW (ref 8.9–10.3)
Chloride: 108 mmol/L (ref 98–111)
Creatinine, Ser: 0.71 mg/dL (ref 0.44–1.00)
GFR, Estimated: >60 mL/min (ref >60)
Glucose, Bld: 81 mg/dL (ref 70–99)
Potassium: 3.3 mmol/L — ABNORMAL LOW (ref 3.5–5.1)
Sodium: 139 mmol/L (ref 135–145)

## 2021-12-13 LAB — CBC
HCT: 36.1 % (ref 36.0–46.0)
Hemoglobin: 11.4 g/dL — ABNORMAL LOW (ref 12.0–15.0)
MCH: 25.6 pg — ABNORMAL LOW (ref 26.0–34.0)
MCHC: 31.6 g/dL (ref 30.0–36.0)
MCV: 80.9 fL (ref 80.0–100.0)
Platelets: 257 10*3/uL (ref 150–400)
RBC: 4.46 MIL/uL (ref 3.87–5.11)
RDW: 14.1 % (ref 11.5–15.5)
WBC: 4.5 10*3/uL (ref 4.0–10.5)
nRBC: 0 % (ref 0.0–0.2)

## 2021-12-13 LAB — I-STAT BETA HCG BLOOD, ED (MC, WL, AP ONLY): I-stat hCG, quantitative: <5 m[IU]/mL (ref <5)

## 2021-12-13 LAB — TROPONIN I (HIGH SENSITIVITY): Troponin I (High Sensitivity): 3 ng/L (ref <18)

## 2021-12-13 MED ORDER — ONDANSETRON HCL 4 MG/2ML IJ SOLN
4.0000 mg | Freq: Once | INTRAMUSCULAR | Status: AC
Start: 2021-12-13 — End: 2021-12-13
  Administered 2021-12-13: 4 mg via INTRAVENOUS
  Filled 2021-12-13: qty 2

## 2021-12-13 MED ORDER — AMLODIPINE BESYLATE 5 MG PO TABS: 5.0000 mg | ORAL_TABLET | Freq: Every day | ORAL | 0 refills | Status: DC

## 2021-12-13 MED ORDER — TELMISARTAN-HCTZ 40-12.5 MG PO TABS: 1.0000 | ORAL_TABLET | Freq: Every day | ORAL | 0 refills | Status: DC

## 2021-12-13 MED ORDER — MECLIZINE HCL 25 MG PO TABS: 25.0000 mg | ORAL_TABLET | Freq: Three times a day (TID) | ORAL | 0 refills | Status: AC | PRN

## 2021-12-13 MED ORDER — MECLIZINE HCL 25 MG PO TABS
25.0000 mg | ORAL_TABLET | Freq: Once | ORAL | Status: AC
  Administered 2021-12-13: 25 mg via ORAL
  Filled 2021-12-13: qty 1

## 2021-12-13 MED ORDER — HYDRALAZINE HCL 20 MG/ML IJ SOLN
20.0000 mg | Freq: Once | INTRAMUSCULAR | Status: AC
  Administered 2021-12-13: 20 mg via INTRAVENOUS
  Filled 2021-12-13: qty 1

## 2021-12-13 NOTE — ED Triage Notes (Signed)
Pt arrived via EMS, from dr office, sent over for chest pain, dizziness, blurred vision x2 months. No blurred vision or chest pain in triage. Dizzy in triage.

## 2021-12-13 NOTE — ED Provider Triage Note (Signed)
Emergency Medicine Provider Triage Evaluation Note  Michelle Rojas , a 41 y.o. female  was evaluated in triage.  Pt complains of blood pressure.  Patient states that she has been off of her telmisartan HCTZ for several months.  She went to new PCPs office today to establish care as well as address intermittent issues of dizziness/lightheadedness for the past month.  She was noted to be extremely hypertensive and brought to the ED via ambulance.  Patient also states that she has been having intermittent chest pains for the past 3 to 4 weeks.  She states that she feels like she is more winded than normal as well walking up the stairs.   Review of Systems  Positive: + dizziness/lightheadedness, blurred vision, nausea, chest pain, SOB Negative: - weakness or numbness  Physical Exam  BP (!) 207/102 (BP Location: Left Arm)    Pulse 74    Temp 98 F (36.7 C) (Oral)    Resp 18    LMP 11/29/2021 (Approximate)    SpO2 100%  Gen:   Awake, no distress   Resp:  Normal effort  MSK:   Moves extremities without difficulty  Other:  CN 2-12 intact. No facial droop. No pronater drift. Normal finger to nose. Strength 5/5 to BUE and BLEs. Sensation intact throughout.   Medical Decision Making  Medically screening exam initiated at 2:57 PM.  Appropriate orders placed.  Michelle Rojas was informed that the remainder of the evaluation will be completed by another provider, this initial triage assessment does not replace that evaluation, and the importance of remaining in the ED until their evaluation is complete.     Eustaquio Maize, PA-C 12/13/21 1458

## 2021-12-13 NOTE — Discharge Instructions (Addendum)
Please pick up high  blood pressure medications and take as prescribed. It is recommended that you keep a log of your blood pressure readings daily to take with you to your next PCP appointment  I have also prescribed medication to take as needed for dizziness/vertigo. Please take as needed.   You will need to call to schedule a follow up appointment with your PCP.   Return to the ED for any new/worsening symptoms

## 2021-12-13 NOTE — ED Provider Notes (Signed)
[facility] DEPT Provider Note   CSN: [id number] Arrival date & time: 12/13/21  1432     History  Chief Complaint  Patient presents with   Chest Pain   Dizziness    Michelle Rojas is a 41 y.o. female who presents to the ED today via EMS for HTN.  Patient states that she has been off of her telmisartan HCTZ for several months.  She went to new PCPs office today to establish care as well as address intermittent issues of dizziness/lightheadedness for the past month.  She was noted to be extremely hypertensive and brought to the ED via ambulance.  Patient also states that she has been having intermittent chest pains for the past 3 to 4 weeks.  She states that she feels like she is more winded than normal as well walking up the stairs.   Pt describes the dizzy spells as a pressure sensation in her head with blurry vision/spots in her vision with feelings like she could pass out. She reports that she feels like she needs to sit down and after a minute or so her symptoms will resolve. She does however mention at times the room is also spinning. She feels like her symptoms are worse with movement.   The history is provided by the patient and medical records.      Home Medications Prior to Admission medications   Medication Sig Start Date End Date Taking? Authorizing Provider  amLODipine (NORVASC) 5 MG tablet Take 1 tablet (5 mg total) by mouth daily. 12/13/21 01/12/22 Yes Tiran Sauseda, PA-C  meclizine (ANTIVERT) 25 MG tablet Take 1 tablet (25 mg total) by mouth 3 (three) times daily as needed for dizziness. 12/13/21  Yes Sharunda Salmon, PA-C  telmisartan-hydrochlorothiazide (MICARDIS HCT) 40-12.5 MG tablet Take 1 tablet by mouth daily. 12/13/21 01/12/22 Yes Roby Spalla, PA-C  BENLYSTA 200 MG/ML SOAJ once a week. 02/03/19   [provider]  diclofenac sodium (VOLTAREN) 1 % GEL Apply 4 g topically 4 (four) times daily. 09/17/18   Marcial Pacas, MD   ibuprofen (ADVIL,MOTRIN) 600 MG tablet Take 1 tablet (600 mg total) every 6 (six) hours by mouth. Patient taking differently: Take 600 mg by mouth as needed.  09/25/17   Louretta Shorten, MD  traMADol (ULTRAM) 50 MG tablet Take 1-2 tablets (50-100 mg total) by mouth daily as needed. 11/06/18   Leandrew Koyanagi, MD  valACYclovir (VALTREX) 500 MG tablet Take 500 mg by mouth as needed (fever blisters).    [provider]      Allergies    Patient has no known allergies.    Review of Systems   Review of Systems  Constitutional:  Negative for chills and fever.  Eyes:  Positive for visual disturbance.  Respiratory:  Positive for shortness of breath. Negative for cough.   Cardiovascular:  Positive for chest pain.  Gastrointestinal:  Negative for nausea and vomiting.  Neurological:  Positive for dizziness, light-headedness and headaches. Negative for syncope.  All other systems reviewed and are negative.  Physical Exam Updated Vital Signs BP (!) 181/116    Pulse 93    Temp 98 F (36.7 C) (Oral)    Resp 13    LMP 11/29/2021 (Approximate)    SpO2 99%  Physical Exam Vitals and nursing note reviewed.  Constitutional:      Appearance: She is not ill-appearing or diaphoretic.  HENT:     Head: Normocephalic and atraumatic.  Eyes:     Extraocular  Movements: Extraocular movements intact.     Conjunctiva/sclera: Conjunctivae normal.     Pupils: Pupils are equal, round, and reactive to light.  Cardiovascular:     Rate and Rhythm: Normal rate and regular rhythm.     Heart sounds: Normal heart sounds.  Pulmonary:     Effort: Pulmonary effort is normal.     Breath sounds: Normal breath sounds.  Abdominal:     Palpations: Abdomen is soft.     Tenderness: There is no abdominal tenderness. There is no guarding or rebound.  Musculoskeletal:     Cervical back: Neck supple.     Right lower leg: No edema.     Left lower leg: No edema.  Skin:    General: Skin is warm and dry.  Neurological:      Mental Status: She is alert.     Comments: Alert and oriented to self, place, time and event.   Speech is fluent, clear without dysarthria or dysphasia.   Strength 5/5 in upper/lower extremities   Sensation intact in upper/lower extremities   Normal gait.  Negative Romberg. No pronator drift.  Normal finger-to-nose and feet tapping.  CN I not tested  CN II grossly intact visual fields bilaterally. Did not visualize posterior eye.  CN III, IV, VI PERRLA and EOMs intact bilaterally  CN V Intact sensation to sharp and light touch to the face  CN VII facial movements symmetric  CN VIII not tested  CN IX, X no uvula deviation, symmetric rise of soft palate  CN XI 5/5 SCM and trapezius strength bilaterally  CN XII Midline tongue protrusion, symmetric L/R movements     ED Results / Procedures / Treatments   Labs (all labs ordered are listed, but only abnormal results are displayed) Labs Reviewed  BASIC METABOLIC PANEL - Abnormal; Notable for the following components:      Result Value   Potassium 3.3 (*)    Calcium 8.8 (*)    All other components within normal limits  CBC - Abnormal; Notable for the following components:   Hemoglobin 11.4 (*)    MCH 25.6 (*)    All other components within normal limits  I-STAT BETA HCG BLOOD, ED ([facility], [facility], [facility] ONLY)  TROPONIN I (HIGH SENSITIVITY)    EKG EKG Interpretation  Date/Time:  Monday December 13 2021 14:49:27 EST Ventricular Rate:  66 PR Interval:  149 QRS Duration: 91 QT Interval:  402 QTC Calculation: 422 R Axis:   31 Text Interpretation: Sinus rhythm No significant change since prior 7/17 Confirmed by Aletta Edouard [phone]) on 12/13/2021 4:12:49 PM  Radiology DG Chest 2 View  Result Date: 12/13/2021 CLINICAL DATA:  Chest pain EXAM: CHEST - 2 VIEW COMPARISON:  06/26/2006 FINDINGS: The heart size and mediastinal contours are within normal limits. Both lungs are clear. The visualized skeletal structures are unremarkable. IMPRESSION:  No active cardiopulmonary disease. Electronically Signed   By: Davina Poke D.O.   On: 12/13/2021 15:33   CT Head Wo Contrast  Result Date: 12/13/2021 CLINICAL DATA:  Dizziness, persistent/recurrent, cardiac or vascular cause suspected EXAM: CT HEAD WITHOUT CONTRAST TECHNIQUE: Contiguous axial images were obtained from the base of the skull through the vertex without intravenous contrast. RADIATION DOSE REDUCTION: This exam was performed according to the departmental dose-optimization program which includes automated exposure control, adjustment of the mA and/or kV according to patient size and/or use of iterative reconstruction technique. COMPARISON:  None. FINDINGS: Brain: There is no acute intracranial hemorrhage, mass effect, or  edema. Gray-white differentiation is preserved. There is no extra-axial fluid collection. Ventricles and sulci are within normal limits in size and configuration. Vascular: No hyperdense vessel or unexpected calcification. Skull: Calvarium is unremarkable. Sinuses/Orbits: No acute finding. Other: Sella is partially empty and mildly expanded. IMPRESSION: No acute intracranial abnormality. Partially empty, mildly expanded sella, a nonspecific finding that can be seen in the setting idiopathic intracranial hypertension. Electronically Signed   By: Macy Mis M.D.   On: 12/13/2021 16:57    Procedures Procedures    Medications Ordered in ED Medications  hydrALAZINE (APRESOLINE) injection 20 mg (20 mg Intravenous Given 12/13/21 1626)  meclizine (ANTIVERT) tablet 25 mg (25 mg Oral Given 12/13/21 1856)  ondansetron (ZOFRAN) injection 4 mg (4 mg Intravenous Given 12/13/21 1856)    ED Course/ Medical Decision Making/ A&P                           Medical Decision Making 41 year old female presents to the ED today with complaint of high blood pressure.  She went to her PCPs office today and was found to be hypertensive prompting ED visit.  On arrival to the ED she is blood  pressure 207/102 with repeat 221/117.  She reports intermittent issues of dizziness/lightheadedness/possible vertigo.  No focal neurodeficits on exam.  Work-up started in triage including CBC, BMP, troponin, UA, beta-hCG, chest x-ray.  Given complaints of dizziness with elevated blood pressure CT head also ordered to evaluate for any acute findings today.  Question if her dizziness/lightheadedness is secondary to uncontrolled high blood pressure.  She has been without medications for several months.  Also provide IV hydralazine and reevaluate.  Patient will ultimately need to be restarted back on blood pressure medications.  Blood pressure improved with IV hydralazine with readings of 166/109.  Workup overall reassuring. In the ED pt had another episode of dizziness with emesis. She does report it is worse with head movement. Pt provided with meclizine with improvement in symptoms. Question vertigo.   Pt stable for discharge home at this time. She will need to follow back up with PCP. She is discharged with 1 months worth of her previous BP meds. She is instructed to keep daily log of BP readings to take with to PCP office. She is in agreement with plan and stable for discharge home.   Problems Addressed: Primary hypertension: acute illness or injury Vertigo: acute illness or injury  Amount and/or Complexity of Data Reviewed Labs: ordered.    Details: CBC without leukocytosis. Hgb stable at 11.4 compared to previous BMP without electrolyte abnormalities beta hcg negative Troponin 3 No signs of end organ damage on labs Radiology: ordered.    Details: CXR negative CT head negative  Risk Prescription drug management.           Final Clinical Impression(s) / ED Diagnoses Final diagnoses:  Primary hypertension  Vertigo    Rx / DC Orders ED Discharge Orders          Ordered    amLODipine (NORVASC) 5 MG tablet  Daily        12/13/21 1950    telmisartan-hydrochlorothiazide  (MICARDIS HCT) 40-12.5 MG tablet  Daily        12/13/21 1950    meclizine (ANTIVERT) 25 MG tablet  3 times daily PRN        12/13/21 1950             Discharge Instructions  Please pick up high  blood pressure medications and take as prescribed. It is recommended that you keep a log of your blood pressure readings daily to take with you to your next PCP appointment  I have also prescribed medication to take as needed for dizziness/vertigo. Please take as needed.   You will need to call to schedule a follow up appointment with your PCP.   Return to the ED for any new/worsening symptoms       Eustaquio Maize, Hershal Coria 12/13/21 1953    Hayden Rasmussen, MD 12/14/21 [phone]

## 2023-08-10 ENCOUNTER — Other Ambulatory Visit: Payer: Self-pay

## 2023-08-10 ENCOUNTER — Telehealth: Payer: Self-pay

## 2023-08-10 ENCOUNTER — Inpatient Hospital Stay (HOSPITAL_COMMUNITY)
Admission: EM | Admit: 2023-08-10 | Discharge: 2023-08-17 | DRG: 442 | Disposition: A | Discharge status: Home / Self Care | Payer: 59 | Attending: Internal Medicine | Admitting: Internal Medicine

## 2023-08-10 ENCOUNTER — Emergency Department (HOSPITAL_COMMUNITY): Payer: 59

## 2023-08-10 DIAGNOSIS — R17 Unspecified jaundice: Secondary | ICD-10-CM | POA: Diagnosis present

## 2023-08-10 DIAGNOSIS — K5900 Constipation, unspecified: Secondary | ICD-10-CM | POA: Diagnosis not present

## 2023-08-10 DIAGNOSIS — Z5986 Financial insecurity: Secondary | ICD-10-CM | POA: Diagnosis not present

## 2023-08-10 DIAGNOSIS — D84821 Immunodeficiency due to drugs: Secondary | ICD-10-CM | POA: Diagnosis present

## 2023-08-10 DIAGNOSIS — I10 Essential (primary) hypertension: Secondary | ICD-10-CM | POA: Diagnosis present

## 2023-08-10 DIAGNOSIS — R7401 Elevation of levels of liver transaminase levels: Secondary | ICD-10-CM | POA: Diagnosis not present

## 2023-08-10 DIAGNOSIS — K219 Gastro-esophageal reflux disease without esophagitis: Secondary | ICD-10-CM | POA: Diagnosis present

## 2023-08-10 DIAGNOSIS — Z79899 Other long term (current) drug therapy: Secondary | ICD-10-CM

## 2023-08-10 DIAGNOSIS — J309 Allergic rhinitis, unspecified: Secondary | ICD-10-CM | POA: Diagnosis present

## 2023-08-10 DIAGNOSIS — M797 Fibromyalgia: Secondary | ICD-10-CM | POA: Diagnosis present

## 2023-08-10 DIAGNOSIS — Z8249 Family history of ischemic heart disease and other diseases of the circulatory system: Secondary | ICD-10-CM

## 2023-08-10 DIAGNOSIS — L299 Pruritus, unspecified: Secondary | ICD-10-CM

## 2023-08-10 DIAGNOSIS — D252 Subserosal leiomyoma of uterus: Secondary | ICD-10-CM | POA: Diagnosis present

## 2023-08-10 DIAGNOSIS — R112 Nausea with vomiting, unspecified: Secondary | ICD-10-CM | POA: Diagnosis not present

## 2023-08-10 DIAGNOSIS — K754 Autoimmune hepatitis: Secondary | ICD-10-CM

## 2023-08-10 DIAGNOSIS — Z7960 Long term (current) use of unspecified immunomodulators and immunosuppressants: Secondary | ICD-10-CM

## 2023-08-10 DIAGNOSIS — B179 Acute viral hepatitis, unspecified: Secondary | ICD-10-CM | POA: Diagnosis not present

## 2023-08-10 DIAGNOSIS — M351 Other overlap syndromes: Secondary | ICD-10-CM | POA: Diagnosis present

## 2023-08-10 DIAGNOSIS — Z6832 Body mass index (BMI) 32.0-32.9, adult: Secondary | ICD-10-CM

## 2023-08-10 DIAGNOSIS — E669 Obesity, unspecified: Secondary | ICD-10-CM | POA: Diagnosis present

## 2023-08-10 DIAGNOSIS — R1011 Right upper quadrant pain: Secondary | ICD-10-CM | POA: Diagnosis not present

## 2023-08-10 DIAGNOSIS — J301 Allergic rhinitis due to pollen: Secondary | ICD-10-CM | POA: Diagnosis not present

## 2023-08-10 DIAGNOSIS — E876 Hypokalemia: Secondary | ICD-10-CM | POA: Diagnosis present

## 2023-08-10 DIAGNOSIS — R7989 Other specified abnormal findings of blood chemistry: Secondary | ICD-10-CM | POA: Diagnosis not present

## 2023-08-10 HISTORY — DX: Autoimmune hepatitis: K75.4

## 2023-08-10 LAB — COMPREHENSIVE METABOLIC PANEL
ALT: 1822 U/L — ABNORMAL HIGH (ref 0–44)
AST: 1191 U/L — ABNORMAL HIGH (ref 15–41)
Albumin: 3.9 g/dL (ref 3.5–5.0)
Alkaline Phosphatase: 231 U/L — ABNORMAL HIGH (ref 38–126)
Anion gap: 10 (ref 5–15)
BUN: 8 mg/dL (ref 6–20)
CO2: 27 mmol/L (ref 22–32)
Calcium: 9.2 mg/dL (ref 8.9–10.3)
Chloride: 100 mmol/L (ref 98–111)
Creatinine, Ser: 0.66 mg/dL (ref 0.44–1.00)
GFR, Estimated: >60 mL/min (ref >60)
Glucose, Bld: 89 mg/dL (ref 70–99)
Potassium: 3.1 mmol/L — ABNORMAL LOW (ref 3.5–5.1)
Sodium: 137 mmol/L (ref 135–145)
Total Bilirubin: 7.3 mg/dL — ABNORMAL HIGH (ref 0.3–1.2)
Total Protein: 7.9 g/dL (ref 6.5–8.1)

## 2023-08-10 LAB — URINALYSIS, ROUTINE W REFLEX MICROSCOPIC
Glucose, UA: NEGATIVE mg/dL
Hgb urine dipstick: NEGATIVE
Ketones, ur: NEGATIVE mg/dL
Leukocytes,Ua: NEGATIVE
Nitrite: NEGATIVE
Protein, ur: 30 mg/dL — AB
Specific Gravity, Urine: 1.024 (ref 1.005–1.030)
pH: 5 (ref 5.0–8.0)

## 2023-08-10 LAB — PROTIME-INR
INR: 0.9 (ref 0.8–1.2)
Prothrombin Time: 12.8 seconds (ref 11.4–15.2)

## 2023-08-10 LAB — CBC
HCT: 38.9 % (ref 36.0–46.0)
Hemoglobin: 12.4 g/dL (ref 12.0–15.0)
MCH: 25.3 pg — ABNORMAL LOW (ref 26.0–34.0)
MCHC: 31.9 g/dL (ref 30.0–36.0)
MCV: 79.4 fL — ABNORMAL LOW (ref 80.0–100.0)
Platelets: 291 10*3/uL (ref 150–400)
RBC: 4.9 MIL/uL (ref 3.87–5.11)
RDW: 15.7 % — ABNORMAL HIGH (ref 11.5–15.5)
WBC: 3.4 10*3/uL — ABNORMAL LOW (ref 4.0–10.5)
nRBC: 0 % (ref 0.0–0.2)

## 2023-08-10 LAB — MAGNESIUM: Magnesium: 1.9 mg/dL (ref 1.7–2.4)

## 2023-08-10 LAB — ETHANOL: Alcohol, Ethyl (B): <10 mg/dL (ref <10)

## 2023-08-10 LAB — AMMONIA: Ammonia: 21 umol/L (ref 9–35)

## 2023-08-10 LAB — ACETAMINOPHEN LEVEL: Acetaminophen (Tylenol), Serum: <10 ug/mL — ABNORMAL LOW (ref 10–30)

## 2023-08-10 LAB — LIPASE, BLOOD: Lipase: 21 U/L (ref 11–51)

## 2023-08-10 LAB — HCG, SERUM, QUALITATIVE: Preg, Serum: NEGATIVE

## 2023-08-10 MED ORDER — SODIUM CHLORIDE 0.9 % IV BOLUS
1000.0000 mL | Freq: Once | INTRAVENOUS | Status: AC
  Administered 2023-08-10: 1000 mL via INTRAVENOUS

## 2023-08-10 MED ORDER — LACTATED RINGERS IV SOLN: INTRAVENOUS | Status: DC

## 2023-08-10 MED ORDER — ONDANSETRON HCL 4 MG/2ML IJ SOLN
4.0000 mg | Freq: Four times a day (QID) | INTRAMUSCULAR | Status: DC | PRN
  Administered 2023-08-11 (×2): 4 mg via INTRAVENOUS
  Filled 2023-08-10 (×2): qty 2

## 2023-08-10 MED ORDER — MELATONIN 3 MG PO TABS
3.0000 mg | ORAL_TABLET | Freq: Every evening | ORAL | Status: DC | PRN
  Administered 2023-08-14 – 2023-08-16 (×2): 3 mg via ORAL
  Filled 2023-08-10 (×2): qty 1

## 2023-08-10 MED ORDER — FENTANYL CITRATE PF 50 MCG/ML IJ SOSY
25.0000 ug | PREFILLED_SYRINGE | INTRAMUSCULAR | Status: DC | PRN
  Administered 2023-08-16 – 2023-08-17 (×3): 25 ug via INTRAVENOUS
  Filled 2023-08-10 (×3): qty 1

## 2023-08-10 MED ORDER — NALOXONE HCL 0.4 MG/ML IJ SOLN: 0.4000 mg | INTRAMUSCULAR | Status: DC | PRN

## 2023-08-10 MED ORDER — ACETAMINOPHEN 325 MG PO TABS: 650.0000 mg | ORAL_TABLET | Freq: Four times a day (QID) | ORAL | Status: DC | PRN

## 2023-08-10 MED ORDER — ACETAMINOPHEN 650 MG RE SUPP: 650.0000 mg | Freq: Four times a day (QID) | RECTAL | Status: DC | PRN

## 2023-08-10 MED ORDER — ONDANSETRON HCL 4 MG/2ML IJ SOLN
4.0000 mg | Freq: Once | INTRAMUSCULAR | Status: AC
  Administered 2023-08-10: 4 mg via INTRAVENOUS
  Filled 2023-08-10: qty 2

## 2023-08-10 NOTE — H&P (Signed)
History and Physical      Michelle Rojas SWF:[id number] DOB: 1980/12/25 DOA: 08/10/2023; DOS: 08/10/2023  PCP: [facility]  Patient coming from: home   I have personally briefly reviewed patient's old medical records in [facility]  Chief Complaint: n/v  HPI: Michelle Rojas is a 42 y.o. female with medical history significant for mixed connective tissue disorder, essential hypertension, who is admitted to [facility] on 08/10/2023 with jaundice associate with acute transaminitis after presenting from home to [facility] ED complaining of nausea vomiting.   The patient reports 1 week of intermittent nausea resulting in 1-2 daily episodes of nonbloody, nonbilious emesis.  This has been associated with mild sharp nonradiating right upper quadrant abdominal discomfort, worse with palpation.  Not associate with any diarrhea, melena, or hematochezia.  She is also noted a darkening of her urine over the last few days, in the absence of any associated dysuria.  Denies any associated subjective fever, chills, rigors, or generalized myalgias.  No recent alcohol consumption or any recent use of acetaminophen.  She also denies any recreational drug use.  No consumption of wild mushrooms.   Her medical history is notable for mixed connective tissue disorder for which she is on chronic immunosuppressive therapy via q weekly Benlysta injections.   Per chart review, most recent prior liver enzymes were checked in October 2018, and were notable for the following results at that time: Alkaline phosphatase 427, total bilirubin 0.7, AST 28, ALT 17.    ED Course:  Vital signs in the ED were notable for the following: Afebrile; heart rates in the 60s to 70s; systolic blood pressure in the 160s; respiratory rate 14-16, oxygen saturation 100% on room air.  Labs were notable for the following: CMP notable for the following: Sodium 137, potassium 3.1, creatinine 0.66, glucose 89, alkaline  phosphatase 231, total bilirubin 7.3, AST 1191, ALT 1822.  Lipase 21.  CBC notable for will with cell count 3400, hemoglobin 12.4.  INR 0.9, PTT 12.8.  Urinalysis notable for no white blood cells, leukocyte esterase/nitrate negative, moderate bilirubin.  Serum ethanol level less than 10.  Acute viral hepatitis panel is ordered, with result currently pending.  Serum acetaminophen level less than 10.  Ammonia level 21.  Per my interpretation, EKG in ED demonstrated the following: No EKG performed today.  Imaging in the ED, per corresponding formal radiology read, was notable for the following: Right upper quadrant ultrasound showed no evidence of gallstones no evidence gallbladder wall thickening or any evidence of pericholecystic fluid; common bile duct measured 2.7 mm and no evidence of choledocholithiasis.  While in the ED, the following were administered: Zofran 4 mg IV x 1, normal saline x 1 L bolus.  Subsequently, the patient was admitted for further evaluation management of presenting new onset jaundice in the setting of acute transaminitis, with presenting labs also notable for hypokalemia.    Review of Systems: As per HPI otherwise 10 point review of systems negative.   Past Medical History:  Diagnosis Date   Anxiety    PP treated with counseling no meds   Fibromyalgia    GERD (gastroesophageal reflux disease)    History of palpitations    negative work-up by cardiologist-- dr Eden Emms-- 2015   Hypertension    taking labetalol   MCTD (mixed connective tissue disease) (HCC)    dx 07/ 2015--  rheumotologist--  dr Zenovia Jordan   Nausea    Numbness and tingling  Wears contact lenses     Past Surgical History:  Procedure Laterality Date   DILATION AND EVACUATION N/A 06/17/2016   Procedure: DILATATION AND EVACUATION;  Surgeon: [name], DO;  Location: [facility];  Service: Gynecology;  Laterality: N/A;  WTIH SUCTION   WISDOM TOOTH EXTRACTION  2012    Social  History:  reports that she has never smoked. She has never used smokeless tobacco. She reports current alcohol use of about 1.0 standard drink of alcohol per week. She reports that she does not use drugs.   No Known Allergies  Family History  Problem Relation Age of Onset   Hypertension Mother    Hypertension Father    Hypertension Maternal Grandmother    Healthy Brother    Healthy Brother    Healthy Daughter    Healthy Daughter     Family history reviewed and not pertinent    Prior to Admission medications   Medication Sig Start Date End Date Taking? Authorizing Provider  amLODipine (NORVASC) 10 MG tablet Take 10 mg by mouth daily. 08/08/23  Yes [provider]  cephALEXin (KEFLEX) 500 MG capsule Take 500 mg by mouth 4 (four) times daily. 08/06/23 08/11/23 Yes [provider]  fluticasone (FLONASE) 50 MCG/ACT nasal spray Place 1 spray into both nostrils daily. 05/30/23  Yes [provider]  naproxen (NAPROSYN) 500 MG tablet Take 500 mg by mouth as needed (Pain).   Yes [provider]  ondansetron (ZOFRAN) 4 MG tablet Take 4 mg by mouth every 8 (eight) hours as needed. 08/06/23  Yes [provider]  ondansetron (ZOFRAN-ODT) 4 MG disintegrating tablet Take 4 mg by mouth every 8 (eight) hours as needed for nausea (For up to 7 days). 08/08/23 08/15/23 Yes [provider]  traMADol (ULTRAM) 50 MG tablet Take 1-2 tablets (50-100 mg total) by mouth daily as needed. 11/06/18  Yes Tarry Kos, MD  valACYclovir (VALTREX) 500 MG tablet Take 500 mg by mouth as needed (fever blisters).   Yes [provider]  Vitamin D, Ergocalciferol, (DRISDOL) 1.25 MG (50000 UNIT) CAPS capsule Take 50,000 Units by mouth every 7 (seven) days. 03/01/21  Yes [provider]  BENLYSTA 200 MG/ML SOAJ once a week. 02/03/19   [provider]  buPROPion (WELLBUTRIN XL) 150 MG 24 hr tablet Take 150 mg by mouth in the morning. Patient not taking:  Reported on 08/10/2023 09/12/22   [provider]  diclofenac sodium (VOLTAREN) 1 % GEL Apply 4 g topically 4 (four) times daily. Patient not taking: Reported on 08/10/2023 09/17/18   Levert Feinstein, MD  ibuprofen (ADVIL,MOTRIN) 600 MG tablet Take 1 tablet (600 mg total) every 6 (six) hours by mouth. Patient taking differently: Take 600 mg by mouth as needed. 09/25/17   Candice Camp, MD  INCASSIA 0.35 MG tablet Take 1 tablet by mouth daily. Patient not taking: Reported on 08/10/2023 08/09/23   [provider]  meclizine (ANTIVERT) 25 MG tablet Take 1 tablet (25 mg total) by mouth 3 (three) times daily as needed for dizziness. 12/13/21   Tanda Rockers, PA-C  phenazopyridine (PYRIDIUM) 200 MG tablet Take 200 mg by mouth 3 (three) times daily as needed. Patient not taking: Reported on 08/10/2023 08/06/23   [provider]  telmisartan-hydrochlorothiazide (MICARDIS HCT) 40-12.5 MG tablet Take 1 tablet by mouth daily. 12/13/21 01/12/22  Tanda Rockers, PA-C     Objective    Physical Exam: Vitals:   08/10/23 1515 08/10/23 1844 08/10/23 2130 08/10/23  2159  BP: (!) 160/103 (!) 170/98 (!) 171/91 (!) 165/96  Pulse: 73 68 63 70  Resp: 16 16 16 14   Temp: 98.3 F (36.8 C) 98.9 F (37.2 C)    TempSrc: Oral Oral    SpO2: 100% 100% 100% 100%  Weight: 90.8 kg     Height: 5\' 6"  (1.676 m)       General: appears to be stated age; alert, oriented; jaundiced appearance Skin: warm, dry, no rash Head:  AT/[place] Mouth:  Oral mucosa membranes appear dry, normal dentition Neck: supple; trachea midline Heart:  RRR; did not appreciate any M/R/G Lungs: CTAB, did not appreciate any wheezes, rales, or rhonchi Abdomen: + BS; soft, ND, mild tenderness to palpation in the right upper quadrant in the absence of any associated guarding, rigidity, or rebound tenderness Vascular: 2+ pedal pulses b/l; 2+ radial pulses b/l Extremities: no peripheral edema, no muscle wasting Neuro: strength and sensation  intact in upper and lower extremities b/l   Labs on Admission: I have personally reviewed following labs and imaging studies  CBC: Recent Labs  Lab 08/10/23 1610  WBC 3.4*  HGB 12.4  HCT 38.9  MCV 79.4*  PLT 291   Basic Metabolic Panel: Recent Labs  Lab 08/10/23 1610  NA 137  K 3.1*  CL 100  CO2 27  GLUCOSE 89  BUN 8  CREATININE 0.66  CALCIUM 9.2   GFR: Estimated Creatinine Clearance: 101.9 mL/min (by C-G formula based on SCr of 0.66 mg/dL). Liver Function Tests: Recent Labs  Lab 08/10/23 1610  AST 1,191*  ALT 1,822*  ALKPHOS 231*  BILITOT 7.3*  PROT 7.9  ALBUMIN 3.9   Recent Labs  Lab 08/10/23 1610  LIPASE 21   No results for input(s): "AMMONIA" in the last 168 hours. Coagulation Profile: Recent Labs  Lab 08/10/23 2107  INR 0.9   Cardiac Enzymes: No results for input(s): "CKTOTAL", "CKMB", "CKMBINDEX", "TROPONINI" in the last 168 hours. BNP (last 3 results) No results for input(s): "PROBNP" in the last 8760 hours. HbA1C: No results for input(s): "HGBA1C" in the last 72 hours. CBG: No results for input(s): "GLUCAP" in the last 168 hours. Lipid Profile: No results for input(s): "CHOL", "HDL", "LDLCALC", "TRIG", "CHOLHDL", "LDLDIRECT" in the last 72 hours. Thyroid Function Tests: No results for input(s): "TSH", "T4TOTAL", "FREET4", "T3FREE", "THYROIDAB" in the last 72 hours. Anemia Panel: No results for input(s): "VITAMINB12", "FOLATE", "FERRITIN", "TIBC", "IRON", "RETICCTPCT" in the last 72 hours. Urine analysis:    Component Value Date/Time   COLORURINE AMBER (A) 08/10/2023 1554   APPEARANCEUR CLEAR 08/10/2023 1554   LABSPEC 1.024 08/10/2023 1554   PHURINE 5.0 08/10/2023 1554   GLUCOSEU NEGATIVE 08/10/2023 1554   HGBUR NEGATIVE 08/10/2023 1554   BILIRUBINUR MODERATE (A) 08/10/2023 1554   KETONESUR NEGATIVE 08/10/2023 1554   PROTEINUR 30 (A) 08/10/2023 1554   UROBILINOGEN 0.2 11/30/2013 1519   NITRITE NEGATIVE 08/10/2023 1554    LEUKOCYTESUR NEGATIVE 08/10/2023 1554    Radiological Exams on Admission: US Abdomen Limited RUQ (LIVER/GB)  Result Date: 08/10/2023 CLINICAL DATA:  Vomiting EXAM: ULTRASOUND ABDOMEN LIMITED RIGHT UPPER QUADRANT COMPARISON:  Abdominal ultrasound 05/10/2017 FINDINGS: Gallbladder: No gallstones or wall thickening visualized. No sonographic Murphy sign noted by sonographer. Common bile duct: Diameter: 2.7 mm Liver: No focal lesion identified. Within normal limits in parenchymal echogenicity. Portal vein is patent on color Doppler imaging with normal direction of blood flow towards the liver. Other: None. IMPRESSION: Normal right upper quadrant ultrasound. Electronically Signed   By:  Darliss Cheney M.D.   On: 08/10/2023 22:14      Assessment/Plan    Principal Problem:   Jaundice Active Problems:   Essential hypertension   Transaminitis   Right upper quadrant pain   Hypokalemia   Allergic rhinitis    #) Jaundice in the setting of acute transaminitis: Patient presents with new onset jaundice as well as acute transaminitis, as quantified above in setting of 1 week of nausea/vomiting as well as right upper quadrant abdominal discomfort.  Patient's elevation in alkaline phosphatase and total bilirubin any suspicion for cholestatic process, although upper quadrant ultrasound shows no evidence of overt biliary obstruction or any evidence of gallstones or acute cholecystitis.  She is afebrile and without any hypotension.  Presentation less suggestive of ascending colonangitis, and SIRS criteria not met a this time.  Will further evaluate, including evaluation for obstructing stone or extrinsic biliary obstruction via CT abdomen/pelvis.  Patient may ultimately warrant MRCP.  Will also check direct bilirubin level as well as GGT to evaluate for biliary obstruction.   In terms of hepatocellular considerations, the patient denies any chronic alcohol abuse and any recent alcohol consumption, and and serum  ethanol levels found to be nonelevated.  Additionally, she denies any use of recreational drugs.  She was recently on Keflex, which can be associated with acute transaminitis.  Differential includes rheumatologic possibilities given her history of mixed connective tissue disorder.  Given the ALT predominance of her acute transaminitis, acute viral hepatitis panel has been ordered, with result currently pending.  Serum acetaminophen level is not elevated.  Her hepatic synthetic function appears to be intact, noting normal presenting INR level.  Plan: Check direct bilirubin, GGT.  Repeat CMP, CBC in the morning.  Repeat INR in the morning.  Follow-up result of acute viral hepatitis panel.  Urinary drug screen.  Check serum plasmin, LDH.  CT abdomen/pelvis.  Prn IV fentanyl.  Prn IV Zofran.                  #) Hypokalemia: presenting potassium level noted to be 3.1.  This appears to be in the setting of recent increase in GI losses as well as recent relative decline in oral intake.  Presenting serum magnesium level 1.9.  Plan: monitor on tele. KCl 40 meq IV over 4 hours. CMP, mag level in the AM.  Lactated Ringer's, as above.                  #) Essential Hypertension: documented h/o such, with outpatient antihypertensive regimen including amlodipine, telmisartan, HCTZ.  SBP's in the ED today: 160s mmHg.   Plan: Close monitoring of subsequent BP via routine VS. resume home amlodipine for now.                     #) Allergic Rhinitis: documented h/o such, on scheduled intranasal Flonase as outpatient.   Plan: cont home Flonase.      DVT prophylaxis: SCD's   Code Status: Full code Family Communication: none Disposition Plan: Per Rounding Team Consults called: none;  Admission status: inpatient    I SPENT GREATER THAN 75  MINUTES IN CLINICAL CARE TIME/MEDICAL DECISION-MAKING IN COMPLETING THIS ADMISSION.     Chaney Born Adin Laker DO [facility] From 7PM - 7AM   08/10/2023, 10:31 PM

## 2023-08-10 NOTE — ED Provider Triage Note (Addendum)
Emergency Medicine Provider Triage Evaluation Note  Darlys Gales , a 42 y.o. female  was evaluated in triage.  Pt complains of abnormal lab tests. Went to UC originally for vomiting and not feeling well, diagnosed with UTI, then followed up with PCP because she still wasn't feel well. Still vomiting about 4 times per day.   Review of Systems  Positive: Vomiting, abd pain Negative: Fever, chills, diarrhea  Physical Exam  BP (!) 160/103 (BP Location: Left Arm)   Pulse 73   Temp 98.3 F (36.8 C) (Oral)   Resp 16   Ht 5\' 6"  (1.676 m)   Wt 90.8 kg   SpO2 100%   BMI 32.30 kg/m  Gen:   Awake, no distress   Resp:  Normal effort  MSK:   Moves extremities without difficulty  Other:    Medical Decision Making  Medically screening exam initiated at 4:01 PM.  Appropriate orders placed.  Carolanne A Mckeel was informed that the remainder of the evaluation will be completed by another provider, this initial triage assessment does not replace that evaluation, and the importance of remaining in the ED until their evaluation is complete.  Per patient's screenshot on phone, AST 1083 ALT 1812 Bilirubin 4.6 Alk phos 313   Siennah Barrasso T, PA-C 08/10/23 1603  ADDENDUM -- blood work resulted: AST 1191, ALT 1822, Alk phos 231, total bilirubin 73. Ordered RUQ [place]   Ebubechukwu Jedlicka T, PA-C 08/10/23 1945

## 2023-08-10 NOTE — Telephone Encounter (Signed)
Patient was transferred to me from [facility] switchboard. Patient stated that she was sent for IV fluids from her [facility] provider. She gave me the clinic number as [phone]. I called the clinic and asked for IV fluid orders to be sent to [place] and they stated that the patient needed to be evaluated at the Emergency Department for further treatment. Voice Message left for patient regarding this information.

## 2023-08-10 NOTE — ED Provider Notes (Signed)
[place] EMERGENCY DEPARTMENT AT [facility] Provider Note  CSN: [id number] Arrival date & time: 08/10/23 1505  Chief Complaint(s) Abnormal Lab  HPI Michelle Rojas is a 42 y.o. female history of lupus on Benylsta, hypertension presenting to the emergency department with abnormal lab.  Patient reports for the past week she has been nauseous, started developing itching, some vomiting, noticed some dark urine as well.  She went to an urgent care and they thought she might have a UTI.  She reports that symptoms did not improve and antibiotics.  She was having persistent nausea and vomiting so she followed up with her primary doctor today who obtained laboratory testing which showed elevated liver function testing.  She was told to come to the emergency department for further evaluation.  She reports rare occasional social alcohol use but denies daily drinking alcohol daily.  She denies any other new medications.  She has been on her lupus injectable medication for years.  She denies any excessive Tylenol use and reports she mainly takes naproxen for her joint pain from lupus.  She denies any IV drug use.  Does reports right upper quadrant pain.   Past Medical History Past Medical History:  Diagnosis Date   Anxiety    PP treated with counseling no meds   Fibromyalgia    GERD (gastroesophageal reflux disease)    History of palpitations    negative work-up by cardiologist-- dr Eden Emms-- 2015   Hypertension    taking labetalol   MCTD (mixed connective tissue disease) (HCC)    dx 07/ 2015--  rheumotologist--  dr Zenovia Jordan   Nausea    Numbness and tingling    Wears contact lenses    Patient Active Problem List   Diagnosis Date Noted   Jaundice 08/10/2023   History of anxiety 02/07/2019   Right arm pain 09/17/2018   SVD (spontaneous vaginal delivery) 09/23/2017   HTN in pregnancy, chronic 09/22/2017   Connective tissue disorder (HCC) 02/24/2015   Dyspnea 12/13/2013    Palpitations 12/13/2013   Chest pain 12/13/2013   Hypertension 08/26/2011   Home Medication(s) Prior to Admission medications   Medication Sig Start Date End Date Taking? Authorizing Provider  amLODipine (NORVASC) 10 MG tablet Take 10 mg by mouth daily. 08/08/23  Yes [provider]  cephALEXin (KEFLEX) 500 MG capsule Take 500 mg by mouth 4 (four) times daily. 08/06/23 08/11/23 Yes [provider]  fluticasone (FLONASE) 50 MCG/ACT nasal spray Place 1 spray into both nostrils daily. 05/30/23  Yes [provider]  naproxen (NAPROSYN) 500 MG tablet Take 500 mg by mouth as needed (Pain).   Yes [provider]  ondansetron (ZOFRAN) 4 MG tablet Take 4 mg by mouth every 8 (eight) hours as needed. 08/06/23  Yes [provider]  ondansetron (ZOFRAN-ODT) 4 MG disintegrating tablet Take 4 mg by mouth every 8 (eight) hours as needed for nausea (For up to 7 days). 08/08/23 08/15/23 Yes [provider]  traMADol (ULTRAM) 50 MG tablet Take 1-2 tablets (50-100 mg total) by mouth daily as needed. 11/06/18  Yes Tarry Kos, MD  valACYclovir (VALTREX) 500 MG tablet Take 500 mg by mouth as needed (fever blisters).   Yes [provider]  Vitamin D, Ergocalciferol, (DRISDOL) 1.25 MG (50000 UNIT) CAPS capsule Take 50,000 Units by mouth every 7 (seven) days. 03/01/21  Yes [provider]  BENLYSTA 200 MG/ML SOAJ once a week. 02/03/19   [provider]  buPROPion (WELLBUTRIN XL)  150 MG 24 hr tablet Take 150 mg by mouth in the morning. Patient not taking: Reported on 08/10/2023 09/12/22   [provider]  diclofenac sodium (VOLTAREN) 1 % GEL Apply 4 g topically 4 (four) times daily. Patient not taking: Reported on 08/10/2023 09/17/18   Levert Feinstein, MD  ibuprofen (ADVIL,MOTRIN) 600 MG tablet Take 1 tablet (600 mg total) every 6 (six) hours by mouth. Patient taking differently: Take 600 mg by mouth as needed. 09/25/17   Candice Camp, MD   INCASSIA 0.35 MG tablet Take 1 tablet by mouth daily. Patient not taking: Reported on 08/10/2023 08/09/23   [provider]  meclizine (ANTIVERT) 25 MG tablet Take 1 tablet (25 mg total) by mouth 3 (three) times daily as needed for dizziness. 12/13/21   Tanda Rockers, PA-C  phenazopyridine (PYRIDIUM) 200 MG tablet Take 200 mg by mouth 3 (three) times daily as needed. Patient not taking: Reported on 08/10/2023 08/06/23   [provider]  telmisartan-hydrochlorothiazide (MICARDIS HCT) 40-12.5 MG tablet Take 1 tablet by mouth daily. 12/13/21 01/12/22  Tanda Rockers, PA-C                                                                                                                                    Past Surgical History Past Surgical History:  Procedure Laterality Date   DILATION AND EVACUATION N/A 06/17/2016   Procedure: DILATATION AND EVACUATION;  Surgeon: [name], DO;  Location: [facility];  Service: Gynecology;  Laterality: N/A;  WTIH SUCTION   WISDOM TOOTH EXTRACTION  2012   Family History Family History  Problem Relation Age of Onset   Hypertension Mother    Hypertension Father    Hypertension Maternal Grandmother    Healthy Brother    Healthy Brother    Healthy Daughter    Healthy Daughter     Social History Social History   Tobacco Use   Smoking status: Never   Smokeless tobacco: Never  Vaping Use   Vaping status: Never Used  Substance Use Topics   Alcohol use: Yes    Alcohol/week: 1.0 standard drink of alcohol    Types: 1 Glasses of wine per week    Comment: social settings   Drug use: No   Allergies Patient has no known allergies.  Review of Systems Review of Systems  All other systems reviewed and are negative.   Physical Exam Vital Signs  I have reviewed the triage vital signs BP (!) 165/96 (BP Location: Left Arm)   Pulse 70   Temp 98.2 F (36.8 C) (Oral)   Resp 14   Ht 5\' 6"  (1.676 m)   Wt 90.8 kg   SpO2 100%    BMI 32.30 kg/m  Physical Exam Vitals and nursing note reviewed.  Constitutional:      General: She is not in acute distress.    Appearance: She is well-developed.  HENT:  Head: Normocephalic and atraumatic.     Mouth/Throat:     Mouth: Mucous membranes are moist.  Eyes:     General: Scleral icterus present.     Pupils: Pupils are equal, round, and reactive to light.  Cardiovascular:     Rate and Rhythm: Normal rate and regular rhythm.     Heart sounds: No murmur heard. Pulmonary:     Effort: Pulmonary effort is normal. No respiratory distress.     Breath sounds: Normal breath sounds.  Abdominal:     General: Abdomen is flat.     Palpations: Abdomen is soft.     Tenderness: There is abdominal tenderness (RUQ ttp).  Musculoskeletal:        General: No tenderness.     Right lower leg: No edema.     Left lower leg: No edema.  Skin:    General: Skin is warm and dry.  Neurological:     General: No focal deficit present.     Mental Status: She is alert. Mental status is at baseline.  Psychiatric:        Mood and Affect: Mood normal.        Behavior: Behavior normal.     ED Results and Treatments Labs (all labs ordered are listed, but only abnormal results are displayed) Labs Reviewed  URINALYSIS, ROUTINE W REFLEX MICROSCOPIC - Abnormal; Notable for the following components:      Result Value   Color, Urine AMBER (*)    Bilirubin Urine MODERATE (*)    Protein, ur 30 (*)    Bacteria, UA RARE (*)    All other components within normal limits  CBC - Abnormal; Notable for the following components:   WBC 3.4 (*)    MCV 79.4 (*)    MCH 25.3 (*)    RDW 15.7 (*)    All other components within normal limits  COMPREHENSIVE METABOLIC PANEL - Abnormal; Notable for the following components:   Potassium 3.1 (*)    AST 1,191 (*)    ALT 1,822 (*)    Alkaline Phosphatase 231 (*)    Total Bilirubin 7.3 (*)    All other components within normal limits  ACETAMINOPHEN LEVEL -  Abnormal; Notable for the following components:   Acetaminophen (Tylenol), Serum <10 (*)    All other components within normal limits  HCG, SERUM, QUALITATIVE  LIPASE, BLOOD  ETHANOL  PROTIME-INR  AMMONIA  HEPATITIS PANEL, ACUTE  CBC WITH DIFFERENTIAL/PLATELET  COMPREHENSIVE METABOLIC PANEL  MAGNESIUM  MAGNESIUM  BILIRUBIN, DIRECT  GAMMA GT                                                                                                                          Radiology US Abdomen Limited RUQ (LIVER/GB)  Result Date: 08/10/2023 CLINICAL DATA:  Vomiting EXAM: ULTRASOUND ABDOMEN LIMITED RIGHT UPPER QUADRANT COMPARISON:  Abdominal ultrasound 05/10/2017 FINDINGS: Gallbladder: No gallstones or wall thickening visualized. No sonographic Murphy sign noted by sonographer. Common  bile duct: Diameter: 2.7 mm Liver: No focal lesion identified. Within normal limits in parenchymal echogenicity. Portal vein is patent on color Doppler imaging with normal direction of blood flow towards the liver. Other: None. IMPRESSION: Normal right upper quadrant ultrasound. Electronically Signed   By: Darliss Cheney M.D.   On: 08/10/2023 22:14    Pertinent labs & imaging results that were available during my care of the patient were reviewed by me and considered in my medical decision making (see MDM for details).  Medications Ordered in ED Medications  acetaminophen (TYLENOL) tablet 650 mg (has no administration in time range)    Or  acetaminophen (TYLENOL) suppository 650 mg (has no administration in time range)  melatonin tablet 3 mg (has no administration in time range)  ondansetron (ZOFRAN) injection 4 mg (has no administration in time range)  naloxone [facility]) injection 0.4 mg (has no administration in time range)  fentaNYL (SUBLIMAZE) injection 25 mcg (has no administration in time range)  lactated ringers infusion (has no administration in time range)  sodium chloride 0.9 % bolus 1,000 mL (0 mLs  Intravenous Stopped 08/10/23 2222)  ondansetron (ZOFRAN) injection 4 mg (4 mg Intravenous Given 08/10/23 2111)                                                                                                                                     Procedures Procedures  (including critical care time)  Medical Decision Making / ED Course   MDM:  42 year old female with history of lupus presenting to the emergency department with abnormal blood tests.  Patient is overall well-appearing, vital signs reassuring, does have icteric sclera.  Laboratory testing concerning for acute liver injury.  Patient has been having some nausea, itching, vomiting and malaise which would correlate with her abnormal liver testing.  She has some right upper quadrant tenderness on exam.  Differential includes autoimmune hepatitis, infectious hepatitis, cholecystitis, toxic hepatitis, medication reaction.  Denies history of blood clot but could also be due to other unusual process such as Budd-Chiari syndrome.  Given degree of liver abnormality believe patient will need to be admitted.  Added additional testing including PT/INR, ammonia, ethanol level, Tylenol level.  Clinical Course as of 08/10/23 2251  Thu Aug 10, 2023  2250 Tylenol level, ethanol level negative.  Discussed with Dr. Dimas Aguas or who admitted the patient for further management.  Right upper quadrant ultrasound unremarkable. [WS]    Clinical Course User Index [WS] Lonell Grandchild, MD      Lab Tests: -I ordered, reviewed, and interpreted labs.   The pertinent results include:   Labs Reviewed  URINALYSIS, ROUTINE W REFLEX MICROSCOPIC - Abnormal; Notable for the following components:      Result Value   Color, Urine AMBER (*)    Bilirubin Urine MODERATE (*)    Protein, ur 30 (*)    Bacteria, UA RARE (*)    All other  components within normal limits  CBC - Abnormal; Notable for the following components:   WBC 3.4 (*)    MCV 79.4 (*)    MCH  25.3 (*)    RDW 15.7 (*)    All other components within normal limits  COMPREHENSIVE METABOLIC PANEL - Abnormal; Notable for the following components:   Potassium 3.1 (*)    AST 1,191 (*)    ALT 1,822 (*)    Alkaline Phosphatase 231 (*)    Total Bilirubin 7.3 (*)    All other components within normal limits  ACETAMINOPHEN LEVEL - Abnormal; Notable for the following components:   Acetaminophen (Tylenol), Serum <10 (*)    All other components within normal limits  HCG, SERUM, QUALITATIVE  LIPASE, BLOOD  ETHANOL  PROTIME-INR  AMMONIA  HEPATITIS PANEL, ACUTE  CBC WITH DIFFERENTIAL/PLATELET  COMPREHENSIVE METABOLIC PANEL  MAGNESIUM  MAGNESIUM  BILIRUBIN, DIRECT  GAMMA GT    Notable for abnormal LFTs  EKG   EKG Interpretation Date/Time:    Ventricular Rate:    PR Interval:    QRS Duration:    QT Interval:    QTC Calculation:   R Axis:      Text Interpretation:           Imaging Studies ordered: I ordered imaging studies including RUQ [place]  On my interpretation imaging demonstrates no acute process I independently visualized and interpreted imaging. I agree with the radiologist interpretation   Medicines ordered and prescription drug management: Meds ordered this encounter  Medications   sodium chloride 0.9 % bolus 1,000 mL   ondansetron (ZOFRAN) injection 4 mg   OR Linked Order Group    acetaminophen (TYLENOL) tablet 650 mg    acetaminophen (TYLENOL) suppository 650 mg   melatonin tablet 3 mg   ondansetron (ZOFRAN) injection 4 mg   naloxone (NARCAN) injection 0.4 mg   fentaNYL (SUBLIMAZE) injection 25 mcg   lactated ringers infusion    -I have reviewed the patients home medicines and have made adjustments as needed   Consultations Obtained: I requested consultation with the hospitalist,  and discussed lab and imaging findings as well as pertinent plan - they recommend: admission   Cardiac Monitoring: The patient was maintained on a cardiac monitor.  I  personally viewed and interpreted the cardiac monitored which showed an underlying rhythm of: NSR  Social Determinants of Health:  Diagnosis or treatment significantly limited by social determinants of health: obesity   Reevaluation: After the interventions noted above, I reevaluated the patient and found that their symptoms have stayed the same  Co morbidities that complicate the patient evaluation  Past Medical History:  Diagnosis Date   Anxiety    PP treated with counseling no meds   Fibromyalgia    GERD (gastroesophageal reflux disease)    History of palpitations    negative work-up by cardiologist-- dr Eden Emms-- 2015   Hypertension    taking labetalol   MCTD (mixed connective tissue disease) (HCC)    dx 07/ 2015--  rheumotologist--  dr Zenovia Jordan   Nausea    Numbness and tingling    Wears contact lenses       Dispostion: Disposition decision including need for hospitalization was considered, and patient admitted to the hospital.    Final Clinical Impression(s) / ED Diagnoses Final diagnoses:  Acute hepatitis     This chart was dictated using voice recognition software.  Despite best efforts to proofread,  errors can occur which can change the documentation  meaning.    Lonell Grandchild, MD 08/10/23 2251

## 2023-08-10 NOTE — ED Triage Notes (Signed)
Patient here from home reporting abnormal lab value - elevated liver enzymes. Cannot see lab values in chart - states that they were drawn by [facility]. Requesting IV fluids.

## 2023-08-11 ENCOUNTER — Encounter (HOSPITAL_COMMUNITY): Payer: Self-pay | Admitting: Internal Medicine

## 2023-08-11 ENCOUNTER — Other Ambulatory Visit: Payer: Self-pay

## 2023-08-11 ENCOUNTER — Inpatient Hospital Stay (HOSPITAL_COMMUNITY): Payer: 59

## 2023-08-11 DIAGNOSIS — R7989 Other specified abnormal findings of blood chemistry: Secondary | ICD-10-CM | POA: Diagnosis not present

## 2023-08-11 DIAGNOSIS — R1011 Right upper quadrant pain: Secondary | ICD-10-CM | POA: Diagnosis present

## 2023-08-11 DIAGNOSIS — R17 Unspecified jaundice: Secondary | ICD-10-CM | POA: Diagnosis not present

## 2023-08-11 DIAGNOSIS — E876 Hypokalemia: Secondary | ICD-10-CM | POA: Diagnosis present

## 2023-08-11 DIAGNOSIS — R7401 Elevation of levels of liver transaminase levels: Secondary | ICD-10-CM | POA: Diagnosis present

## 2023-08-11 DIAGNOSIS — J309 Allergic rhinitis, unspecified: Secondary | ICD-10-CM | POA: Diagnosis present

## 2023-08-11 LAB — COMPREHENSIVE METABOLIC PANEL
ALT: 1763 U/L — ABNORMAL HIGH (ref 0–44)
AST: 1272 U/L — ABNORMAL HIGH (ref 15–41)
Albumin: 3.5 g/dL (ref 3.5–5.0)
Alkaline Phosphatase: 214 U/L — ABNORMAL HIGH (ref 38–126)
Anion gap: 8 (ref 5–15)
BUN: 7 mg/dL (ref 6–20)
CO2: 24 mmol/L (ref 22–32)
Calcium: 8.5 mg/dL — ABNORMAL LOW (ref 8.9–10.3)
Chloride: 104 mmol/L (ref 98–111)
Creatinine, Ser: 0.44 mg/dL (ref 0.44–1.00)
GFR, Estimated: >60 mL/min (ref >60)
Glucose, Bld: 81 mg/dL (ref 70–99)
Potassium: 3.6 mmol/L (ref 3.5–5.1)
Sodium: 136 mmol/L (ref 135–145)
Total Bilirubin: 7.7 mg/dL — ABNORMAL HIGH (ref 0.3–1.2)
Total Protein: 7.1 g/dL (ref 6.5–8.1)

## 2023-08-11 LAB — CBC WITH DIFFERENTIAL/PLATELET
Abs Immature Granulocytes: 0.01 10*3/uL (ref 0.00–0.07)
Basophils Absolute: 0 10*3/uL (ref 0.0–0.1)
Basophils Relative: 1 %
Eosinophils Absolute: 0 10*3/uL (ref 0.0–0.5)
Eosinophils Relative: 1 %
HCT: 38 % (ref 36.0–46.0)
Hemoglobin: 12.2 g/dL (ref 12.0–15.0)
Immature Granulocytes: 0 %
Lymphocytes Relative: 50 %
Lymphs Abs: 1.7 10*3/uL (ref 0.7–4.0)
MCH: 25.4 pg — ABNORMAL LOW (ref 26.0–34.0)
MCHC: 32.1 g/dL (ref 30.0–36.0)
MCV: 79.2 fL — ABNORMAL LOW (ref 80.0–100.0)
Monocytes Absolute: 0.6 10*3/uL (ref 0.1–1.0)
Monocytes Relative: 19 %
Neutro Abs: 1 10*3/uL — ABNORMAL LOW (ref 1.7–7.7)
Neutrophils Relative %: 29 %
Platelets: 274 10*3/uL (ref 150–400)
RBC: 4.8 MIL/uL (ref 3.87–5.11)
RDW: 15.7 % — ABNORMAL HIGH (ref 11.5–15.5)
WBC: 3.3 10*3/uL — ABNORMAL LOW (ref 4.0–10.5)
nRBC: 0 % (ref 0.0–0.2)

## 2023-08-11 LAB — PROTIME-INR
INR: 1 (ref 0.8–1.2)
Prothrombin Time: 13.3 seconds (ref 11.4–15.2)

## 2023-08-11 LAB — RAPID URINE DRUG SCREEN, HOSP PERFORMED
Amphetamines: NOT DETECTED
Barbiturates: NOT DETECTED
Benzodiazepines: NOT DETECTED
Cocaine: NOT DETECTED
Opiates: NOT DETECTED
Tetrahydrocannabinol: POSITIVE — AB

## 2023-08-11 LAB — MONONUCLEOSIS SCREEN: Mono Screen: NEGATIVE

## 2023-08-11 LAB — HEPATITIS PANEL, ACUTE
HCV Ab: NONREACTIVE
Hep A IgM: NONREACTIVE
Hep B C IgM: NONREACTIVE
Hepatitis B Surface Ag: NONREACTIVE

## 2023-08-11 LAB — BILIRUBIN, DIRECT: Bilirubin, Direct: 4.9 mg/dL — ABNORMAL HIGH (ref 0.0–0.2)

## 2023-08-11 LAB — GAMMA GT: GGT: 82 U/L — ABNORMAL HIGH (ref 7–50)

## 2023-08-11 LAB — LACTATE DEHYDROGENASE: LDH: 465 U/L — ABNORMAL HIGH (ref 98–192)

## 2023-08-11 LAB — MAGNESIUM: Magnesium: 1.8 mg/dL (ref 1.7–2.4)

## 2023-08-11 MED ORDER — SODIUM CHLORIDE (PF) 0.9 % IJ SOLN
INTRAMUSCULAR | Status: AC
  Filled 2023-08-11: qty 50

## 2023-08-11 MED ORDER — POTASSIUM CHLORIDE 10 MEQ/100ML IV SOLN
10.0000 meq | INTRAVENOUS | Status: AC
  Administered 2023-08-11 (×4): 10 meq via INTRAVENOUS
  Filled 2023-08-11 (×4): qty 100

## 2023-08-11 MED ORDER — IOHEXOL 300 MG/ML  SOLN
100.0000 mL | Freq: Once | INTRAMUSCULAR | Status: AC | PRN
  Administered 2023-08-11: 100 mL via INTRAVENOUS

## 2023-08-11 MED ORDER — FLUTICASONE PROPIONATE 50 MCG/ACT NA SUSP
1.0000 | Freq: Every day | NASAL | Status: DC
  Administered 2023-08-11 – 2023-08-17 (×7): 1 via NASAL
  Filled 2023-08-11: qty 16

## 2023-08-11 MED ORDER — AMLODIPINE BESYLATE 10 MG PO TABS
10.0000 mg | ORAL_TABLET | Freq: Every day | ORAL | Status: DC
  Administered 2023-08-11 – 2023-08-17 (×6): 10 mg via ORAL
  Filled 2023-08-11 (×3): qty 1
  Filled 2023-08-11: qty 2
  Filled 2023-08-11 (×2): qty 1

## 2023-08-11 NOTE — ED Notes (Signed)
ED TO INPATIENT HANDOFF REPORT  Name/Age/Gender Michelle Rojas 42 y.o. female  Code Status    Code Status Orders  (From admission, onward)           Start     Ordered   08/10/23 2230  Full code  Continuous       Question:  By:  Answer:  Consent: discussion documented in EHR   08/10/23 2229           Code Status History     Date Active Date Inactive Code Status Order ID Comments User Context   09/23/2017 0326 09/25/2017 1729 Full Code [id number]  Zelphia Cairo, MD Inpatient   09/22/2017 0055 09/23/2017 0326 Full Code [id number]  Zelphia Cairo, MD Inpatient       Home/SNF/Other Home  Chief Complaint Jaundice [R17]  Level of Care/Admitting Diagnosis ED Disposition     ED Disposition  Admit   Condition  --   Comment  Hospital Area: [facility] [[id number]]  Level of Care: Med-Surg [16]  May admit patient to [facility] or [facility] if equivalent level of care is available:: No  Covid Evaluation: Asymptomatic - no recent exposure (last 10 days) testing not required  Diagnosis: Jaundice [[id number]]  Admitting Physician: Angie Fava [[id number]]  Attending Physician: Angie Fava [[id number]]  Certification:: I certify this patient will need inpatient services for at least 2 midnights  Expected Medical Readiness: 08/12/2023          Medical History Past Medical History:  Diagnosis Date   Anxiety    PP treated with counseling no meds   Fibromyalgia    GERD (gastroesophageal reflux disease)    History of palpitations    negative work-up by cardiologist-- dr Eden Emms-- 2015   Hypertension    taking labetalol   MCTD (mixed connective tissue disease) (HCC)    dx 07/ 2015--  rheumotologist--  dr Zenovia Jordan   Nausea    Numbness and tingling    Wears contact lenses     Allergies No Known Allergies  IV Location/Drains/Wounds Patient Lines/Drains/Airways Status     Active Line/Drains/Airways     Name Placement date  Placement time Site Days   Peripheral IV 08/10/23 20 G Left Antecubital 08/10/23  2102  Antecubital  1            Labs/Imaging Results for orders placed or performed during the hospital encounter of 08/10/23 (from the past 48 hour(s))  Urinalysis, Routine w reflex microscopic -Urine, Clean Catch     Status: Abnormal   Collection Time: 08/10/23  3:54 PM  Result Value Ref Range   Color, Urine AMBER (A) YELLOW    Comment: BIOCHEMICALS MAY BE AFFECTED BY COLOR   APPearance CLEAR CLEAR   Specific Gravity, Urine 1.024 1.005 - 1.030   pH 5.0 5.0 - 8.0   Glucose, UA NEGATIVE NEGATIVE mg/dL   Hgb urine dipstick NEGATIVE NEGATIVE   Bilirubin Urine MODERATE (A) NEGATIVE   Ketones, ur NEGATIVE NEGATIVE mg/dL   Protein, ur 30 (A) NEGATIVE mg/dL   Nitrite NEGATIVE NEGATIVE   Leukocytes,Ua NEGATIVE NEGATIVE   RBC / HPF 0-5 0 - 5 RBC/hpf   WBC, UA 0-5 0 - 5 WBC/hpf   Bacteria, UA RARE (A) NONE SEEN   Squamous Epithelial / HPF 0-5 0 - 5 /HPF   Mucus PRESENT     Comment: Performed at [facility], [address] [place], [place] [address]  CBC  Status: Abnormal   Collection Time: 08/10/23  4:10 PM  Result Value Ref Range   WBC 3.4 (L) 4.0 - 10.5 K/uL   RBC 4.90 3.87 - 5.11 MIL/uL   Hemoglobin 12.4 12.0 - 15.0 g/dL   HCT 16.1 09.6 - 04.5 %   MCV 79.4 (L) 80.0 - 100.0 fL   MCH 25.3 (L) 26.0 - 34.0 pg   MCHC 31.9 30.0 - 36.0 g/dL   RDW 40.9 (H) 81.1 - 91.4 %   Platelets 291 150 - 400 K/uL   nRBC 0.0 0.0 - 0.2 %    Comment: Performed at [facility], [address] [place], [place] [address]  Comprehensive metabolic panel     Status: Abnormal   Collection Time: 08/10/23  4:10 PM  Result Value Ref Range   Sodium 137 135 - 145 mmol/L   Potassium 3.1 (L) 3.5 - 5.1 mmol/L   Chloride 100 98 - 111 mmol/L   CO2 27 22 - 32 mmol/L   Glucose, Bld 89 70 - 99 mg/dL    Comment: Glucose reference range applies only to samples taken after fasting for at  least 8 hours.   BUN 8 6 - 20 mg/dL   Creatinine, Ser 6.21 0.44 - 1.00 mg/dL   Calcium 9.2 8.9 - 30.8 mg/dL   Total Protein 7.9 6.5 - 8.1 g/dL   Albumin 3.9 3.5 - 5.0 g/dL   AST 6,578 (H) 15 - 41 U/L   ALT 1,822 (H) 0 - 44 U/L   Alkaline Phosphatase 231 (H) 38 - 126 U/L   Total Bilirubin 7.3 (H) 0.3 - 1.2 mg/dL   GFR, Estimated >46 >96 mL/min    Comment: (NOTE) Calculated using the CKD-EPI Creatinine Equation (2021)    Anion gap 10 5 - 15    Comment: Performed at [facility], [address] [place], [place] [address]  Lipase, blood     Status: None   Collection Time: 08/10/23  4:10 PM  Result Value Ref Range   Lipase 21 11 - 51 U/L    Comment: Performed at [facility], [address], [place], [place] [address]  hCG, serum, qualitative     Status: None   Collection Time: 08/10/23  4:50 PM  Result Value Ref Range   Preg, Serum NEGATIVE NEGATIVE    Comment:        THE SENSITIVITY OF THIS METHODOLOGY IS >10 mIU/mL. Performed at [facility], [address], [place], [place] [address]   Hepatitis panel, acute     Status: None   Collection Time: 08/10/23  9:07 PM  Result Value Ref Range   Hepatitis B Surface Ag NON REACTIVE NON REACTIVE   HCV Ab NON REACTIVE NON REACTIVE    Comment: (NOTE) Nonreactive HCV antibody screen is consistent with no HCV infections,  unless recent infection is suspected or other evidence exists to indicate HCV infection.     Hep A IgM NON REACTIVE NON REACTIVE   Hep B C IgM NON REACTIVE NON REACTIVE    Comment: Performed at [facility], [address], [place], [place] [address]  Acetaminophen level     Status: Abnormal   Collection Time: 08/10/23  9:07 PM  Result Value Ref Range   Acetaminophen (Tylenol), Serum <10 (L) 10 - 30 ug/mL    Comment: (NOTE) Therapeutic concentrations vary significantly. A range of 10-30 ug/mL  may be an effective concentration for many patients.  However, some  are best treated at concentrations outside of this range. Acetaminophen concentrations >150 ug/mL at 4 hours after ingestion  and >50 ug/mL at 12 hours after ingestion are often associated with  toxic reactions.  Performed at [facility], [address], [place], [place] [address]   Ethanol     Status: None   Collection Time: 08/10/23  9:07 PM  Result Value Ref Range   Alcohol, Ethyl (B) <10 <10 mg/dL    Comment: (NOTE) Lowest detectable limit for serum alcohol is 10 mg/dL.  For medical purposes only. Performed at [facility], [address], [place], [place] [address]   Protime-INR     Status: None   Collection Time: 08/10/23  9:07 PM  Result Value Ref Range   Prothrombin Time 12.8 11.4 - 15.2 seconds   INR 0.9 0.8 - 1.2    Comment: (NOTE) INR goal varies based on device and disease states. Performed at [facility], [address], [place], [place] [address]   Ammonia     Status: None   Collection Time: 08/10/23 10:04 PM  Result Value Ref Range   Ammonia 21 9 - 35 umol/L    Comment: Performed at [facility], [address] [place], [place] [address]  Magnesium     Status: None   Collection Time: 08/10/23 10:04 PM  Result Value Ref Range   Magnesium 1.9 1.7 - 2.4 mg/dL    Comment: Performed at [facility], [address], [place], [place] [address]  Rapid urine drug screen (hospital performed)     Status: Abnormal   Collection Time: 08/11/23  3:45 AM  Result Value Ref Range   Opiates NONE DETECTED NONE DETECTED   Cocaine NONE DETECTED NONE DETECTED   Benzodiazepines NONE DETECTED NONE DETECTED   Amphetamines NONE DETECTED NONE DETECTED   Tetrahydrocannabinol POSITIVE (A) NONE DETECTED   Barbiturates NONE DETECTED NONE DETECTED    Comment: (NOTE) DRUG SCREEN FOR MEDICAL PURPOSES ONLY.  IF CONFIRMATION IS NEEDED FOR ANY PURPOSE, NOTIFY LAB WITHIN 5  DAYS.  LOWEST DETECTABLE LIMITS FOR URINE DRUG SCREEN Drug Class                     Cutoff (ng/mL) Amphetamine and metabolites    1000 Barbiturate and metabolites    200 Benzodiazepine                 200 Opiates and metabolites        300 Cocaine and metabolites        300 THC                            50 Performed at [facility], [address], [place], [place] [address]   CBC with Differential/Platelet     Status: Abnormal   Collection Time: 08/11/23  5:49 AM  Result Value Ref Range   WBC 3.3 (L) 4.0 - 10.5 K/uL   RBC 4.80 3.87 - 5.11 MIL/uL   Hemoglobin 12.2 12.0 - 15.0 g/dL   HCT 52.8 41.3 - 24.4 %   MCV 79.2 (L) 80.0 - 100.0 fL   MCH 25.4 (L) 26.0 - 34.0 pg   MCHC 32.1 30.0 - 36.0 g/dL   RDW 01.0 (H) 27.2 - 53.6 %   Platelets 274 150 - 400 K/uL   nRBC 0.0 0.0 - 0.2 %  Neutrophils Relative % 29 %   Neutro Abs 1.0 (L) 1.7 - 7.7 K/uL   Lymphocytes Relative 50 %   Lymphs Abs 1.7 0.7 - 4.0 K/uL   Monocytes Relative 19 %   Monocytes Absolute 0.6 0.1 - 1.0 K/uL   Eosinophils Relative 1 %   Eosinophils Absolute 0.0 0.0 - 0.5 K/uL   Basophils Relative 1 %   Basophils Absolute 0.0 0.0 - 0.1 K/uL   Immature Granulocytes 0 %   Abs Immature Granulocytes 0.01 0.00 - 0.07 K/uL    Comment: Performed at [facility], [address] [place], [place] [address]  Comprehensive metabolic panel     Status: Abnormal   Collection Time: 08/11/23  5:49 AM  Result Value Ref Range   Sodium 136 135 - 145 mmol/L   Potassium 3.6 3.5 - 5.1 mmol/L   Chloride 104 98 - 111 mmol/L   CO2 24 22 - 32 mmol/L   Glucose, Bld 81 70 - 99 mg/dL    Comment: Glucose reference range applies only to samples taken after fasting for at least 8 hours.   BUN 7 6 - 20 mg/dL   Creatinine, Ser 0.10 0.44 - 1.00 mg/dL   Calcium 8.5 (L) 8.9 - 10.3 mg/dL   Total Protein 7.1 6.5 - 8.1 g/dL   Albumin 3.5 3.5 - 5.0 g/dL   AST 2,725 (H) 15 - 41 U/L   ALT 1,763 (H) 0 - 44 U/L    Alkaline Phosphatase 214 (H) 38 - 126 U/L   Total Bilirubin 7.7 (H) 0.3 - 1.2 mg/dL   GFR, Estimated >36 >64 mL/min    Comment: (NOTE) Calculated using the CKD-EPI Creatinine Equation (2021)    Anion gap 8 5 - 15    Comment: Performed at [facility], [address], [place], [place] [address]  Magnesium     Status: None   Collection Time: 08/11/23  5:49 AM  Result Value Ref Range   Magnesium 1.8 1.7 - 2.4 mg/dL    Comment: Performed at [facility], [address], [place], [place] [address]  Bilirubin, direct     Status: Abnormal   Collection Time: 08/11/23  5:49 AM  Result Value Ref Range   Bilirubin, Direct 4.9 (H) 0.0 - 0.2 mg/dL    Comment: Performed at [facility], [address], [place], [place] [address]  Gamma GT     Status: Abnormal   Collection Time: 08/11/23  5:49 AM  Result Value Ref Range   GGT 82 (H) 7 - 50 U/L    Comment: Performed at [facility], [address], [place], [place] [address]  Protime-INR     Status: None   Collection Time: 08/11/23  5:49 AM  Result Value Ref Range   Prothrombin Time 13.3 11.4 - 15.2 seconds   INR 1.0 0.8 - 1.2    Comment: (NOTE) INR goal varies based on device and disease states. Performed at [facility], [address] [place], [place] [address]   Lactate dehydrogenase     Status: Abnormal   Collection Time: 08/11/23  5:49 AM  Result Value Ref Range   LDH 465 (H) 98 - 192 U/L    Comment: Performed at [facility], [address], [place], [place] [address]   CT ABDOMEN PELVIS W CONTRAST  Result Date: 08/11/2023 CLINICAL DATA:  42 year old female with dark urine, jaundice. Abnormal LFTs. On antibiotics for UTI.  History of a mixed connective tissue disorder. Right upper quadrant pain. Abdomen and flank pain. EXAM: CT ABDOMEN AND PELVIS WITH CONTRAST TECHNIQUE: Multidetector CT imaging of the abdomen and pelvis was  performed using the standard protocol following bolus administration of intravenous contrast. RADIATION DOSE REDUCTION: This exam was performed according to the departmental dose-optimization program which includes automated exposure control, adjustment of the mA and/or kV according to patient size and/or use of iterative reconstruction technique. CONTRAST:  OMNIPAQUE IOHEXOL 300 MG/ML  SOLN COMPARISON:  Abdomen ultrasound yesterday. CT Abdomen and Pelvis 02/09/2015. FINDINGS: Lower chest: No cardiomegaly, pericardial or pleural effusion. Lung bases appear negative except for mild lower lobe curvilinear scarring or atelectasis. Hepatobiliary: Coarse granular appearance of the liver parenchyma (series 2, image 6) but no hepatomegaly or discrete liver lesion. Hepatic veins and portal veins appear to be patent and appear fairly unremarkable. The gallbladder is partially contracted and unremarkable. No biliary ductal dilatation. No perihepatic free fluid. Pancreas: Negative.  No pancreatic ductal dilatation or atrophy. Spleen: Diminutive spleen although with a similar course enhancement pattern as that seen in the liver (series 2, image 4). No perisplenic fluid. Adrenals/Urinary Tract: Negative adrenal glands and nonobstructed kidneys. No delayed renal images. Small right renal mid pole benign-appearing cyst (no follow-up imaging recommended). Diminutive bladder. Occasional pelvic phleboliths. Stomach/Bowel: No dilated bowel loops. Hyperdense retained stool in the large bowel. There is trace free fluid in the right lower quadrant adjacent to the lateral cecum on series 8, image 54. The appendix arising medial from the cecum appears normal on coronal image 59. No convincing large bowel inflammation. Decompressed stomach and duodenum. No free air. Vascular/Lymphatic: Portal venous system appears to be patent and nondilated. Major arterial structures in the abdomen and pelvis appear patent and normal. No calcified  atherosclerosis or lymphadenopathy identified. Reproductive: There is a round, lobulated, heterogeneously hyperdense soft tissue mass in the anterior pelvis overlying the urinary bladder which is new from 2016, although appears inseparable from and likely exophytic from the ventral uterus on sagittal image 96. The exophytic mass is 75 x 89 x 72 mm (AP by transverse by CC) mild regional mass effect. No regional inflammation. Evidence of a small additional left uterine fundal fibroid on sagittal image 94. Uterus and adnexa otherwise appear negative. Other: No small volume of simple fluid density free fluid in the pelvis, primarily the cul-de-sac on series 2, image 62. Musculoskeletal: No acute or suspicious osseous lesion identified. IMPRESSION: 1. Coarse granular appearance of both the liver and spleen parenchyma. But no associated hepatosplenomegaly. No discrete liver lesion. No stigmata of portal venous hypertension. And no gallbladder or biliary ductal dilatation. Etiology is unclear, but might reflect an intrinsic hepatocellular disease. Liver and spleen involvement by the underlying connective tissue disorder not excluded. 2. There is trace free fluid in the abdomen and pelvis with simple fluid density. No other acute or inflammatory process identified in the abdomen. 3. New since 2016 rounded midline pelvic mass measuring up to 8.9 cm diameter located on top of the urinary bladder. But favor this is a large subserosal fibroid exophytic from the ventral uterus. A smaller uterine fundal fibroid is also identified. Electronically Signed   By: Odessa Fleming M.D.   On: 08/11/2023 07:20   US Abdomen Limited RUQ (LIVER/GB)  Result Date: 08/10/2023 CLINICAL DATA:  Vomiting EXAM: ULTRASOUND ABDOMEN LIMITED RIGHT UPPER QUADRANT COMPARISON:  Abdominal ultrasound 05/10/2017 FINDINGS: Gallbladder: No gallstones or wall thickening visualized. No sonographic Murphy sign noted by sonographer. Common  bile duct: Diameter: 2.7 mm  Liver: No focal lesion identified. Within normal limits in parenchymal echogenicity. Portal vein is patent on color Doppler imaging with normal direction of blood flow towards the liver. Other: None. IMPRESSION: Normal right upper quadrant ultrasound. Electronically Signed   By: Darliss Cheney M.D.   On: 08/10/2023 22:14    Pending Labs Unresulted Labs (From admission, onward)     Start     Ordered   08/11/23 0059  Ceruloplasmin  Add-on,   AD        08/11/23 0058            Vitals/Pain Today's Vitals   08/11/23 0830 08/11/23 0948 08/11/23 1005 08/11/23 1100  BP: 135/80 135/80  (!) 149/90  Pulse: 77  82 76  Resp:    15  Temp:   98.2 F (36.8 C)   TempSrc:   Oral   SpO2: 99%  100% 99%  Weight:      Height:      PainSc:        Isolation Precautions No active isolations  Medications Medications  acetaminophen (TYLENOL) tablet 650 mg (has no administration in time range)    Or  acetaminophen (TYLENOL) suppository 650 mg (has no administration in time range)  melatonin tablet 3 mg (has no administration in time range)  ondansetron (ZOFRAN) injection 4 mg (4 mg Intravenous Given 08/11/23 1050)  naloxone (NARCAN) injection 0.4 mg (has no administration in time range)  fentaNYL (SUBLIMAZE) injection 25 mcg (has no administration in time range)  lactated ringers infusion ( Intravenous Infusion Verify 08/11/23 1141)  amLODipine (NORVASC) tablet 10 mg (10 mg Oral Given 08/11/23 0948)  fluticasone (FLONASE) 50 MCG/ACT nasal spray 1 spray (1 spray Each Nare Given 08/11/23 0948)  sodium chloride 0.9 % bolus 1,000 mL (0 mLs Intravenous Stopped 08/10/23 2222)  ondansetron (ZOFRAN) injection 4 mg (4 mg Intravenous Given 08/10/23 2111)  potassium chloride 10 mEq in 100 mL IVPB (0 mEq Intravenous Stopped 08/11/23 0550)  iohexol (OMNIPAQUE) 300 MG/ML solution 100 mL (100 mLs Intravenous Contrast Given 08/11/23 0627)    Mobility walks

## 2023-08-11 NOTE — Progress Notes (Signed)
PROGRESS NOTE Michelle Rojas  VHQ:[id number] DOB: 1981-02-01 DOA: 08/10/2023 PCP: [facility]  Brief Narrative/Hospital Course: 42 old female with history of mixed connective tissue disorder diagnosed 2015, anxiety, fibromyalgia, GERD, hypertension presented to the ED with abnormal lab with elevated LFTs.  Patient had been nauseous past week and developing itching, some vomiting, dark urine, seen at the urgent care, thought she was having UTI but symptoms did not improve despite antibiotics and persisted with nausea vomiting, seen by PCP on 9/19 labs obtained showed elevated LFTs and sent to the ED. Patient has occasional alcohol use but denies daily drinking, no new medication has been getting injectable medication for MCTD for years and no excessive use of Tylenol reported. In the ED hemodynamically stable, labs confirm abnormal LFTs, RUQ ultrasound-normal right upper quadrant, no gallstone or wall thickening, CBD 2.7 mm no focal lesion of the liver portal vein is patent Patient was admitted> and underwent CT abdomen pelvis with contrast> shows coarse granular appearance in both the liver and spleen parenchyma no hepatosplenomegaly no discrete liver lesion, trace fluid in the abdomen, new since 2016 rounded midline pelvic mass measuring up to 8.9 cm located on top of the urinary bladder favor large soft serosal fibroid exophytic from the ventral uterus.    Subjective: Patient seen and examined this morning She is resting comfortably earlier had nausea after eating, complains of itching and icteric eye Overnight patient afebrile BP stable not hypoxic Labs shows stable electrolytes and renal function, AST ALT remains elevated about the same Assessment and Plan: Principal Problem:   Jaundice Active Problems:   Essential hypertension   Transaminitis   Right upper quadrant pain   Hypokalemia   Allergic rhinitis   Jaundice with abnormal LFTs/Transaminitis: No obvious etiology,  UDS positive for THC, no use of daily alcohol or excessive Tylenol per history.  Imaging with right upper quadrant ultrasound and CT abdomen pelvis with IV contrast no acute significant finding.  Acute viral panel negative, INR normal.  GI will be consulted will order further serology, trend LFTs, MEL D score as below Recent Labs  Lab 08/10/23 1610 08/10/23 2107 08/10/23 2204 08/11/23 0549  AST 1,191*  --   --  1,272*  ALT 1,822*  --   --  1,763*  ALKPHOS 231*  --   --  214*  BILITOT 7.3*  --   --  7.7*  BILIDIR  --   --   --  4.9*  PROT 7.9  --   --  7.1  ALBUMIN 3.9  --   --  3.5  AMMONIA  --   --  21  --   INR  --  0.9  --  1.0  LIPASE 21  --   --   --   PLT 291  --   --  274      Latest Ref Rng & Units 08/10/2023    9:07 PM  Hepatitis  Hep B Surface Ag NON REACTIVE NON REACTIVE   Hep B IgM NON REACTIVE NON REACTIVE   Hep C Ab NON REACTIVE NON REACTIVE   Hep A IgM NON REACTIVE NON REACTIVE    MELD 3.0: 17 at 08/11/2023  5:49 AM MELD-Na: 15 at 08/11/2023  5:49 AM Calculated from: Serum Creatinine: 0.44 mg/dL (Using min of 1 mg/dL) at 44/13/2440  1:02 AM Serum Sodium: 136 mmol/L at 08/11/2023  5:49 AM Total Bilirubin: 7.7 mg/dL at 44/25/3664  4:03 AM Serum Albumin: 3.5 g/dL at 44/74/2595  5:49 AM INR(ratio): 1.0 at 08/11/2023  5:49 AM Age at listing (hypothetical): 42 years Sex: Female at 08/11/2023  5:49 AM  Hypokalemia: Replaced  Hypertension: BP stable continue amlodipine  Allergic rhinitis: Continue Flonase  Mixed continue tissue disorder: She is on Belimumab infusion as outpatient.   Obesity:Patient's Body mass index is 32.3 kg/m. Will benefit with PCP follow-up, weight loss  healthy lifestyle and sleep apnea evaluation as outpatient   DVT prophylaxis: SCDs Start: 08/10/23 2229 Code Status:   Code Status: Full Code Family Communication: plan of care discussed with patient at bedside. Patient status is: Inpatient because of liver dysfunction Level of care:  Med-Surg   Dispo: The patient is from: home            Anticipated disposition: TBD Objective: Vitals last 24 hrs: Vitals:   08/11/23 0830 08/11/23 0948 08/11/23 1005 08/11/23 1100  BP: 135/80 135/80  (!) 149/90  Pulse: 77  82 76  Resp:    15  Temp:   98.2 F (36.8 C)   TempSrc:   Oral   SpO2: 99%  100% 99%  Weight:      Height:       Weight change:   Physical Examination: General exam: alert awake, oriented x 3 HEENT:Oral mucosa moist, Ear/Nose WNL grossly Respiratory system: bilaterally clear BS, no use of accessory muscle Cardiovascular system: S1 & S2 +, No JVD. Gastrointestinal system: Abdomen soft,NT,ND, BS+ Nervous System:Alert, awake, moving extremities. Extremities: LE edema neg,distal peripheral pulses palpable.  Skin: No rashes, icterus ++. MSK: Normal muscle bulk,tone, power  Medications reviewed:  Scheduled Meds:  amLODipine  10 mg Oral Daily   fluticasone  1 spray Each Nare Daily   Continuous Infusions:  lactated ringers 100 mL/hr at 08/10/23 2341    Diet Order             DIET SOFT Room service appropriate? Yes; Fluid consistency: Thin  Diet effective now                 No intake or output data in the 24 hours ending 08/11/23 1105 Net IO Since Admission: No IO data has been entered for this period [08/11/23 1105]  Wt Readings from Last 3 Encounters:  08/10/23 90.8 kg  02/07/19 91.2 kg  09/17/18 95.3 kg     Unresulted Labs (From admission, onward)     Start     Ordered   08/11/23 0500  Gamma GT  Tomorrow morning,   R        08/10/23 2230   08/11/23 0059  Ceruloplasmin  Add-on,   AD        08/11/23 0058          Data Reviewed: I have personally reviewed following labs and imaging studies CBC: Recent Labs  Lab 08/10/23 1610 08/11/23 0549  WBC 3.4* 3.3*  NEUTROABS  --  1.0*  HGB 12.4 12.2  HCT 38.9 38.0  MCV 79.4* 79.2*  PLT 291 274   Basic Metabolic Panel: Recent Labs  Lab 08/10/23 1610 08/10/23 2204 08/11/23 0549   NA 137  --  136  K 3.1*  --  3.6  CL 100  --  104  CO2 27  --  24  GLUCOSE 89  --  81  BUN 8  --  7  CREATININE 0.66  --  0.44  CALCIUM 9.2  --  8.5*  MG  --  1.9 1.8   GFR: Estimated Creatinine Clearance: 101.9 mL/min (by C-G  formula based on SCr of 0.44 mg/dL). Liver Function Tests: Recent Labs  Lab 08/10/23 1610 08/11/23 0549  AST 1,191* 1,272*  ALT 1,822* 1,763*  ALKPHOS 231* 214*  BILITOT 7.3* 7.7*  PROT 7.9 7.1  ALBUMIN 3.9 3.5   Recent Labs  Lab 08/10/23 1610  LIPASE 21   Recent Labs  Lab 08/10/23 2204  AMMONIA 21  Coagulation Profile: Recent Labs  Lab 08/10/23 2107 08/11/23 0549  INR 0.9 1.0  No results found for this or any previous visit (from the past 240 hour(s)).  Antimicrobials: Anti-infectives (From admission, onward)    None      Culture/Microbiology    Component Value Date/Time   SDES URINE, CLEAN CATCH 09/02/2017 1710   SPECREQUEST NONE 09/02/2017 1710   CULT (A) 09/02/2017 1710    MULTIPLE SPECIES PRESENT, SUGGEST RECOLLECTION NO GROUP B STREP (S.AGALACTIAE) ISOLATED Performed at [facility], [address] [place], [place] [address]    REPTSTATUS 09/05/2017 FINAL 09/02/2017 1710    Radiology Studies: CT ABDOMEN PELVIS W CONTRAST  Result Date: 08/11/2023 CLINICAL DATA:  42 year old female with dark urine, jaundice. Abnormal LFTs. On antibiotics for UTI. History of a mixed connective tissue disorder. Right upper quadrant pain. Abdomen and flank pain. EXAM: CT ABDOMEN AND PELVIS WITH CONTRAST TECHNIQUE: Multidetector CT imaging of the abdomen and pelvis was performed using the standard protocol following bolus administration of intravenous contrast. RADIATION DOSE REDUCTION: This exam was performed according to the departmental dose-optimization program which includes automated exposure control, adjustment of the mA and/or kV according to patient size and/or use of iterative reconstruction technique. CONTRAST:  OMNIPAQUE  IOHEXOL 300 MG/ML  SOLN COMPARISON:  Abdomen ultrasound yesterday. CT Abdomen and Pelvis 02/09/2015. FINDINGS: Lower chest: No cardiomegaly, pericardial or pleural effusion. Lung bases appear negative except for mild lower lobe curvilinear scarring or atelectasis. Hepatobiliary: Coarse granular appearance of the liver parenchyma (series 2, image 6) but no hepatomegaly or discrete liver lesion. Hepatic veins and portal veins appear to be patent and appear fairly unremarkable. The gallbladder is partially contracted and unremarkable. No biliary ductal dilatation. No perihepatic free fluid. Pancreas: Negative.  No pancreatic ductal dilatation or atrophy. Spleen: Diminutive spleen although with a similar course enhancement pattern as that seen in the liver (series 2, image 4). No perisplenic fluid. Adrenals/Urinary Tract: Negative adrenal glands and nonobstructed kidneys. No delayed renal images. Small right renal mid pole benign-appearing cyst (no follow-up imaging recommended). Diminutive bladder. Occasional pelvic phleboliths. Stomach/Bowel: No dilated bowel loops. Hyperdense retained stool in the large bowel. There is trace free fluid in the right lower quadrant adjacent to the lateral cecum on series 8, image 54. The appendix arising medial from the cecum appears normal on coronal image 59. No convincing large bowel inflammation. Decompressed stomach and duodenum. No free air. Vascular/Lymphatic: Portal venous system appears to be patent and nondilated. Major arterial structures in the abdomen and pelvis appear patent and normal. No calcified atherosclerosis or lymphadenopathy identified. Reproductive: There is a round, lobulated, heterogeneously hyperdense soft tissue mass in the anterior pelvis overlying the urinary bladder which is new from 2016, although appears inseparable from and likely exophytic from the ventral uterus on sagittal image 96. The exophytic mass is 75 x 89 x 72 mm (AP by transverse by CC) mild  regional mass effect. No regional inflammation. Evidence of a small additional left uterine fundal fibroid on sagittal image 94. Uterus and adnexa otherwise appear negative. Other: No small volume of simple fluid density  free fluid in the pelvis, primarily the cul-de-sac on series 2, image 62. Musculoskeletal: No acute or suspicious osseous lesion identified. IMPRESSION: 1. Coarse granular appearance of both the liver and spleen parenchyma. But no associated hepatosplenomegaly. No discrete liver lesion. No stigmata of portal venous hypertension. And no gallbladder or biliary ductal dilatation. Etiology is unclear, but might reflect an intrinsic hepatocellular disease. Liver and spleen involvement by the underlying connective tissue disorder not excluded. 2. There is trace free fluid in the abdomen and pelvis with simple fluid density. No other acute or inflammatory process identified in the abdomen. 3. New since 2016 rounded midline pelvic mass measuring up to 8.9 cm diameter located on top of the urinary bladder. But favor this is a large subserosal fibroid exophytic from the ventral uterus. A smaller uterine fundal fibroid is also identified. Electronically Signed   By: Odessa Fleming M.D.   On: 08/11/2023 07:20   US Abdomen Limited RUQ (LIVER/GB)  Result Date: 08/10/2023 CLINICAL DATA:  Vomiting EXAM: ULTRASOUND ABDOMEN LIMITED RIGHT UPPER QUADRANT COMPARISON:  Abdominal ultrasound 05/10/2017 FINDINGS: Gallbladder: No gallstones or wall thickening visualized. No sonographic Murphy sign noted by sonographer. Common bile duct: Diameter: 2.7 mm Liver: No focal lesion identified. Within normal limits in parenchymal echogenicity. Portal vein is patent on color Doppler imaging with normal direction of blood flow towards the liver. Other: None. IMPRESSION: Normal right upper quadrant ultrasound. Electronically Signed   By: Darliss Cheney M.D.   On: 08/10/2023 22:14     LOS: 1 day   Lanae Boast, MD [facility]  08/11/2023, 11:05 AM

## 2023-08-11 NOTE — Consult Note (Addendum)
Consultation Note   Referring Provider:  [facility] PCP: [facility]  Primary Gastroenterologist:  Previously  Lina Sar, MD  Reason for Consultation: Abnormal liver tests  DOA: 08/10/2023         Hospital Day: 2   ASSESSMENT & PLAN   Brief Narrative:  42 y.o. year old female with a history of a mixed connective tissue disorder, HTN. She was admitted today with several abnormal liver tests  Nausea / vomiting / generalized abdominal pain / abnormal liver chemistries in mixed pattern. Viral etiology? Drug induced liver injury ( has had a few rounds of Amoxicillin-clavulanate in last few months, last round in mid July (though usually causes cholestatic pattern). Took Cephalexin for a UTI a few days ago. Autoimmune liver disease? Ischemic injury seems unlikely.  Patient is hemodynamically stable with normal mentation, normal INR. Normal glucose. CT AP suggests a coarse granular appearance of both the liver and spleen parenchyma ( ? Maybe connective tissue disease involvement).  --Acute viral hepatitis panel negative.  --ANA, ASMA, EBV and CMV, IgG, am  LFTs INR --Review of older labs show history of elevated alk phos in 2018 but may have been pregnancy related.  --Ceruloplasmin in progress --Avoid hepatotoxic medications  Pelvic mass on CT Scan - favored to be a large subserosal fibroid exophytic from the ventral uterus.   Connective tissue disease, gets weekly infusions of Benlysta   Attending Physician Note   I have taken a history, reviewed the chart and examined the patient. I performed a substantive portion of this encounter, including complete performance of at least one of the key components, in conjunction with the APP. I agree with the APP's note, impression and recommendations with my edits. My additional impressions and recommendations are as follows.   Acute hepatitis. Receives Benlysta weekly for MCTD.  Acute hepatitis panel negative. Imaging studies negative.   R/O EBV, CMV, HSV, DILI (? Cephalexin, Augmentin), autoimmune etiologies. Serologies as above. Trend LFTs, PT/INR. If symptoms improve and LFTs down trending consider discharge Sat or Sun with outpatient follow up.  Claudette Head, MD [facility] See [facility], [facility], for our on call provider     HISTORY OF PRESENT ILLNESS   Michelle Rojas began having generalized abdominal pain ( pressure and stabbing) several days ago. Pain associated with N/V. Symptoms were generally associated with eating. She also felt like food was sticking in her chest. She had similar symptoms when pregnant. Symptoms persisted, she went to UC and was told she had a UTI and was given antibiotics that started with a C. She completed 3-4 days of the antibiotics.  GI symptoms persisted, she saw her PCP who got labs and then sent her to ED.   Michelle Rojas endorses generalized itching and dark urine for several days. No unusual rashes or joint aches. No weight loss. No illicit drug use. She takes gummies with ? CBD or maybe Delta 8. No tylenol use. She has been taking Naproxen. Other home meds include:  daily baby asa, hydrochlorothiazide, norvasc and a weekly infusion for connective tissue disease. She consumes only a couple of Etoh beverages a month. She has no known liver disease and no FMH of liver disease.   No lower GI symptoms such as  diarrhea. BMs normal. No blood in stool.   Improvement in liver tests overnight with exception of bilirubin.    Today:  AST 1272 ALT 1763 Alk phos 214 Tbili 7.7.  INR 1.0 Ammonia normal  RUQ [place] - normal.  CT AP with contrast:  Coarse granular appearance of both the liver and spleen parenchyma. But no associated hepatosplenomegaly. No discrete liver lesion. No stigmata of portal venous hypertension. And no gallbladder or biliary ductal dilatation. Etiology is unclear, but might reflect an intrinsic hepatocellular disease. Liver and spleen  involvement by the underlying connective tissue disorder not excluded.    Previous GI Evaluations     Labs and Imaging: Recent Labs    08/10/23 1610 08/11/23 0549  WBC 3.4* 3.3*  HGB 12.4 12.2  HCT 38.9 38.0  PLT 291 274   Recent Labs    08/10/23 1610 08/11/23 0549  NA 137 136  K 3.1* 3.6  CL 100 104  CO2 27 24  GLUCOSE 89 81  BUN 8 7  CREATININE 0.66 0.44  CALCIUM 9.2 8.5*   Recent Labs    08/11/23 0549  PROT 7.1  ALBUMIN 3.5  AST 1,272*  ALT 1,763*  ALKPHOS 214*  BILITOT 7.7*  BILIDIR 4.9*   Recent Labs    08/10/23 2107  HEPBSAG NON REACTIVE  HCVAB NON REACTIVE  HEPAIGM NON REACTIVE  HEPBIGM NON REACTIVE   Recent Labs    08/10/23 2107 08/11/23 0549  LABPROT 12.8 13.3  INR 0.9 1.0      Past Medical History:  Diagnosis Date   Anxiety    PP treated with counseling no meds   Fibromyalgia    GERD (gastroesophageal reflux disease)    History of palpitations    negative work-up by cardiologist-- dr Eden Emms-- 2015   Hypertension    taking labetalol   MCTD (mixed connective tissue disease) (HCC)    dx 07/ 2015--  rheumotologist--  dr Zenovia Jordan   Nausea    Numbness and tingling    Wears contact lenses     Past Surgical History:  Procedure Laterality Date   DILATION AND EVACUATION N/A 06/17/2016   Procedure: DILATATION AND EVACUATION;  Surgeon: [name], DO;  Location: [facility];  Service: Gynecology;  Laterality: N/A;  WTIH SUCTION   WISDOM TOOTH EXTRACTION  2012    Family History  Problem Relation Age of Onset   Hypertension Mother    Hypertension Father    Hypertension Maternal Grandmother    Healthy Brother    Healthy Brother    Healthy Daughter    Healthy Daughter     Prior to Admission medications   Medication Sig Start Date End Date Taking? Authorizing Provider  amLODipine (NORVASC) 10 MG tablet Take 10 mg by mouth daily. 08/08/23  Yes [provider]  cephALEXin (KEFLEX) 500 MG capsule Take  500 mg by mouth 4 (four) times daily. 08/06/23 08/11/23 Yes [provider]  fluticasone (FLONASE) 50 MCG/ACT nasal spray Place 1 spray into both nostrils daily. 05/30/23  Yes [provider]  naproxen (NAPROSYN) 500 MG tablet Take 500 mg by mouth as needed (Pain).   Yes [provider]  ondansetron (ZOFRAN) 4 MG tablet Take 4 mg by mouth every 8 (eight) hours as needed. 08/06/23  Yes [provider]  ondansetron (ZOFRAN-ODT) 4 MG disintegrating tablet Take 4 mg by mouth every 8 (eight) hours as needed for nausea (For up to 7 days). 08/08/23 08/15/23 Yes [provider]  traMADol (ULTRAM) 50 MG tablet Take 1-2 tablets (50-100 mg total) by mouth daily as needed. 11/06/18  Yes Tarry Kos, MD  valACYclovir (VALTREX) 500 MG tablet Take 500 mg by mouth as needed (fever blisters).   Yes [provider]  Vitamin D, Ergocalciferol, (DRISDOL) 1.25 MG (50000 UNIT) CAPS capsule Take 50,000 Units by mouth every 7 (seven) days. 03/01/21  Yes [provider]  BENLYSTA 200 MG/ML SOAJ once a week. 02/03/19   [provider]  buPROPion (WELLBUTRIN XL) 150 MG 24 hr tablet Take 150 mg by mouth in the morning. Patient not taking: Reported on 08/10/2023 09/12/22   [provider]  diclofenac sodium (VOLTAREN) 1 % GEL Apply 4 g topically 4 (four) times daily. Patient not taking: Reported on 08/10/2023 09/17/18   Levert Feinstein, MD  ibuprofen (ADVIL,MOTRIN) 600 MG tablet Take 1 tablet (600 mg total) every 6 (six) hours by mouth. Patient taking differently: Take 600 mg by mouth as needed. 09/25/17   Candice Camp, MD  INCASSIA 0.35 MG tablet Take 1 tablet by mouth daily. Patient not taking: Reported on 08/10/2023 08/09/23   [provider]  meclizine (ANTIVERT) 25 MG tablet Take 1 tablet (25 mg total) by mouth 3 (three) times daily as needed for dizziness. 12/13/21   Tanda Rockers, PA-C  phenazopyridine (PYRIDIUM) 200 MG tablet Take 200 mg by mouth  3 (three) times daily as needed. Patient not taking: Reported on 08/10/2023 08/06/23   [provider]  telmisartan-hydrochlorothiazide (MICARDIS HCT) 40-12.5 MG tablet Take 1 tablet by mouth daily. 12/13/21 01/12/22  Tanda Rockers, PA-C    Current Facility-Administered Medications  Medication Dose Route Frequency Provider Last Rate Last Admin   acetaminophen (TYLENOL) tablet 650 mg  650 mg Oral Q6H PRN Howerter, Justin B, DO       Or   acetaminophen (TYLENOL) suppository 650 mg  650 mg Rectal Q6H PRN Howerter, Justin B, DO       amLODipine (NORVASC) tablet 10 mg  10 mg Oral Daily Howerter, Justin B, DO   10 mg at 08/11/23 0948   fentaNYL (SUBLIMAZE) injection 25 mcg  25 mcg Intravenous Q2H PRN Howerter, Justin B, DO       fluticasone (FLONASE) 50 MCG/ACT nasal spray 1 spray  1 spray Each Nare Daily Howerter, Justin B, DO   1 spray at 08/11/23 7829   lactated ringers infusion   Intravenous Continuous Howerter, Justin B, DO 100 mL/hr at 08/11/23 1431 New Bag at 08/11/23 1431   melatonin tablet 3 mg  3 mg Oral QHS PRN Howerter, Justin B, DO       naloxone (NARCAN) injection 0.4 mg  0.4 mg Intravenous PRN Howerter, Justin B, DO       ondansetron (ZOFRAN) injection 4 mg  4 mg Intravenous Q6H PRN Howerter, Justin B, DO   4 mg at 08/11/23 1050    Allergies as of 08/10/2023   (No Known Allergies)    Social History   Socioeconomic History   Marital status: Married    Spouse name: Not on file   Number of children: 2   Years of education: some college   Highest education level: Not on file  Occupational History   Occupation: Home maker  Tobacco Use   Smoking status: Never   Smokeless tobacco: Never  Vaping Use   Vaping status: Never Used  Substance and Sexual Activity   Alcohol use: Yes    Alcohol/week: 1.0 standard drink of alcohol  Types: 1 Glasses of wine per week    Comment: social settings   Drug use: No   Sexual activity: Yes  Other Topics Concern   Not on file   Social History Narrative   Lives at home with husband and children.   Right-handed.   Caffeine use:  5 cups per day.   Social Determinants of Health   Financial Resource Strain: High Risk (08/08/2023)   Received from [facility]   Overall Financial Resource Strain (CARDIA)    Difficulty of Paying Living Expenses: Hard  Food Insecurity: Patient Declined (08/11/2023)   Hunger Vital Sign    Worried About Running Out of Food in the Last Year: Patient declined    Ran Out of Food in the Last Year: Patient declined  Recent Concern: Food Insecurity - Food Insecurity Present (08/08/2023)   Received from [facility]   Hunger Vital Sign    Worried About Running Out of Food in the Last Year: Sometimes true    Ran Out of Food in the Last Year: Sometimes true  Transportation Needs: Patient Declined (08/11/2023)   PRAPARE - Administrator, Civil Service (Medical): Patient declined    Lack of Transportation (Non-Medical): Patient declined  Physical Activity: Insufficiently Active (08/08/2023)   Received from [facility]   Exercise Vital Sign    Days of Exercise per Week: 1 day    Minutes of Exercise per Session: 20 min  Stress: Stress Concern Present (08/08/2023)   Received from [facility] - Occupational Stress Questionnaire    Feeling of Stress : Very much  Social Connections: Socially Integrated (08/08/2023)   Received from [facility]   Social Network    How would you rate your social network (family, work, friends)?: Good participation with social networks  Intimate Partner Violence: Patient Declined (08/11/2023)   Humiliation, Afraid, Rape, and Kick questionnaire    Fear of Current or Ex-Partner: Patient declined    Emotionally Abused: Patient declined    Physically Abused: Patient declined    Sexually Abused: Patient declined     Code Status   Code Status: Full Code  Review of Systems: All systems reviewed and negative  except where noted in HPI.  Physical Exam: Vital signs in last 24 hours: Temp:  [98 F (36.7 C)-98.9 F (37.2 C)] 98.4 F (36.9 C) (09/20 1413) Pulse Rate:  [63-86] 72 (09/20 1413) Resp:  [14-18] 16 (09/20 1413) BP: (107-171)/(71-103) 144/89 (09/20 1413) SpO2:  [93 %-100 %] 100 % (09/20 1413) Weight:  [90.8 kg] 90.8 kg (09/19 1515) Last BM Date : 08/10/23  General:  Pleasant female in NAD Psych:  Cooperative. Normal mood and affect Eyes: Pupils equal Ears:  Normal auditory acuity Nose: No deformity, discharge or lesions Neck:  Supple, no masses felt Lungs:  Clear to auscultation.  Heart:  Regular rate, regular rhythm.  Abdomen:  Soft, nondistended, nontender, active bowel sounds, no masses felt Rectal :  Deferred Msk: Symmetrical without gross deformities.  Neurologic:  Alert, oriented, grossly normal neurologically Extremities : No edema Skin:  Intact without significant lesions.    Intake/Output from previous day: No intake/output data recorded. Intake/Output this shift:  Total I/O In: 1199.5 [I.V.:1199.5] Out: -   Principal Problem:   Jaundice Active Problems:   Essential hypertension   Transaminitis   Right upper quadrant pain   Hypokalemia   Allergic rhinitis    Willette Cluster, NP-C   08/11/2023, 2:43 PM

## 2023-08-11 NOTE — Hospital Course (Signed)
65 old female with history of mixed connective tissue disorder diagnosed 2015, anxiety, fibromyalgia, GERD, hypertension presented to the ED with abnormal lab with elevated LFTs.  Patient had been nauseous past week and developing itching, some vomiting, dark urine, seen at the urgent care, thought she was having UTI but symptoms did not improve despite antibiotics and persisted with nausea vomiting, seen by PCP on 9/19 labs obtained showed elevated LFTs and sent to the ED.Patient has occasional alcohol use but denies daily drinking, no new medication has been getting injectable medication for MCTD for years and no excessive use of Tylenol reported. In the ED hemodynamically stable, labs confirmed abnormal LFTs, RUQ ultrasound-normal right upper quadrant, no gallstone or wall thickening, CBD 2.7 mm no focal lesion of the liver portal vein is patent Patient was admitted CT abdomen pelvis with contrast> shows coarse granular appearance in both the liver and spleen parenchyma no hepatosplenomegaly no discrete liver lesion, trace fluid in the abdomen, new since 2016 rounded midline pelvic mass measuring up to 8.9 cm located on top of the urinary bladder favor large soft serosal fibroid exophytic from the ventral uterus. 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 Having nausea vomiting, pruritus and discomfort  and on Reglan and Pepcid/PPI 9/23 IR consulted for liver biopsy.  9/25: Liver biopsy today.  Mitochondrial antibody elevated at 54.2, some worsening of liver enzymes and T. Bili, INR normal.  CMV negative, mononucleosis screen negative.  9/26: Patient had her liver biopsy done yesterday.  Worsening liver enzymes and T. bili.  Discussed with GI and they are okay with discharge on prednisone with a close outpatient follow-up.  Is being discharged on prednisone 40 mg daily for concern of autoimmune  hepatitis as acute hepatitis panel negative.  GI will follow-up closely as outpatient for biopsy results and they will start tapering as appropriate.  She will continue taking current dose of prednisone with Protonix and Pepcid until her GI follow-up on Monday, likely will need a prolonged course of steroid.  Patient otherwise remained stable, appetite remained little poor.  She was advised to keep herself well-hydrated and supplement her diet as  needed.  Patient will continue on current medications and follow-up closely with her providers for further recommendations.

## 2023-08-11 NOTE — ED Notes (Signed)
Assumed care of patient. Patient resting comfortably in bed after going to the bathroom. No distress noted with walking. Patient waiting on ready hospital bed. No complaints or needs at this time.

## 2023-08-11 NOTE — Plan of Care (Signed)

## 2023-08-12 DIAGNOSIS — B179 Acute viral hepatitis, unspecified: Secondary | ICD-10-CM

## 2023-08-12 DIAGNOSIS — R7989 Other specified abnormal findings of blood chemistry: Secondary | ICD-10-CM | POA: Diagnosis not present

## 2023-08-12 DIAGNOSIS — R17 Unspecified jaundice: Secondary | ICD-10-CM | POA: Diagnosis not present

## 2023-08-12 LAB — COMPREHENSIVE METABOLIC PANEL
ALT: 1722 U/L — ABNORMAL HIGH (ref 0–44)
AST: 1127 U/L — ABNORMAL HIGH (ref 15–41)
Albumin: 3.7 g/dL (ref 3.5–5.0)
Alkaline Phosphatase: 226 U/L — ABNORMAL HIGH (ref 38–126)
Anion gap: 7 (ref 5–15)
BUN: <5 mg/dL — ABNORMAL LOW (ref 6–20)
CO2: 26 mmol/L (ref 22–32)
Calcium: 8.9 mg/dL (ref 8.9–10.3)
Chloride: 105 mmol/L (ref 98–111)
Creatinine, Ser: 0.67 mg/dL (ref 0.44–1.00)
GFR, Estimated: >60 mL/min (ref >60)
Glucose, Bld: 96 mg/dL (ref 70–99)
Potassium: 3.5 mmol/L (ref 3.5–5.1)
Sodium: 138 mmol/L (ref 135–145)
Total Bilirubin: 9 mg/dL — ABNORMAL HIGH (ref 0.3–1.2)
Total Protein: 7.1 g/dL (ref 6.5–8.1)

## 2023-08-12 LAB — CERULOPLASMIN: Ceruloplasmin: 42.4 mg/dL — ABNORMAL HIGH (ref 19.0–39.0)

## 2023-08-12 LAB — IGG: IgG (Immunoglobin G), Serum: 1908 mg/dL — ABNORMAL HIGH (ref 586–1602)

## 2023-08-12 LAB — PROTIME-INR
INR: 1 (ref 0.8–1.2)
Prothrombin Time: 13.3 seconds (ref 11.4–15.2)

## 2023-08-12 LAB — ANA: Anti Nuclear Antibody (ANA): POSITIVE — AB

## 2023-08-12 MED ORDER — FAMOTIDINE 20 MG PO TABS
20.0000 mg | ORAL_TABLET | Freq: Two times a day (BID) | ORAL | Status: DC
  Administered 2023-08-12 – 2023-08-17 (×10): 20 mg via ORAL
  Filled 2023-08-12 (×10): qty 1

## 2023-08-12 MED ORDER — METOCLOPRAMIDE HCL 5 MG PO TABS
5.0000 mg | ORAL_TABLET | Freq: Three times a day (TID) | ORAL | Status: AC
  Administered 2023-08-12 – 2023-08-14 (×11): 5 mg via ORAL
  Filled 2023-08-12 (×10): qty 1

## 2023-08-12 MED ORDER — HYDRALAZINE HCL 25 MG PO TABS
25.0000 mg | ORAL_TABLET | Freq: Three times a day (TID) | ORAL | Status: DC | PRN
  Administered 2023-08-12: 25 mg via ORAL
  Filled 2023-08-12: qty 1

## 2023-08-12 NOTE — Progress Notes (Signed)
Progress Note   Assessment    Acute hepatitis. Acute hepatitis panel negative. Imaging studies negative. Monospot negative. R/O EBV, CMV, HSV, DILI (? Cephalexin, Augmentin), autoimmune etiologies.  AST, ALT, alk phos, PT/INR are stable and t bili increased to 9.0. Multiple serologies pending.  Nausea, vomiting secondary to above MCTD on Benlysta weekly   Recommendations   Trend LFTs, PT/INR. When symptoms improve and LFTs down trending consider discharge. Metoclopramide 5 mg po ac & hs  Famotidine 20 mg po bid Change to full liquid diet    Chief Complaint   Nausea, vomiting persist. Urine dark.   Vital signs in last 24 hours: Temp:  [97.8 F (36.6 C)-99 F (37.2 C)] 98.4 F (36.9 C) (09/21 0509) Pulse Rate:  [71-76] 73 (09/21 0509) Resp:  [15-17] 16 (09/21 0509) BP: (126-155)/(81-97) 128/81 (09/21 0509) SpO2:  [97 %-100 %] 100 % (09/21 0509) Weight:  [89.3 kg] 89.3 kg (09/21 0500) Last BM Date : 08/10/23  General: Alert, well-developed, in NAD HEENT: scleral icterus Heart:  Regular rate and rhythm; no murmurs Chest: Clear to ascultation bilaterally Abdomen:  Soft, nontender and nondistended. Normal bowel sounds, without guarding, and without rebound.   Extremities:  Without edema. Neurologic:  Alert and  oriented x4; grossly normal neurologically. Psych:  Alert and cooperative. Normal mood and affect.  Intake/Output from previous day: 09/20 0701 - 09/21 0700 In: 2153.4 [P.O.:120; I.V.:2033.4] Out: 1300 [Urine:1300] Intake/Output this shift: Total I/O In: -  Out: 200 [Urine:200]  Lab Results: Recent Labs    08/10/23 1610 08/11/23 0549  WBC 3.4* 3.3*  HGB 12.4 12.2  HCT 38.9 38.0  PLT 291 274   BMET Recent Labs    08/10/23 1610 08/11/23 0549 08/12/23 0720  NA 137 136 138  K 3.1* 3.6 3.5  CL 100 104 105  CO2 27 24 26   GLUCOSE 89 81 96  BUN 8 7 <5*  CREATININE 0.66 0.44 0.67  CALCIUM 9.2 8.5* 8.9   LFT Recent Labs    08/11/23 0549  08/12/23 0720  PROT 7.1 7.1  ALBUMIN 3.5 3.7  AST 1,272* 1,127*  ALT 1,763* 1,722*  ALKPHOS 214* 226*  BILITOT 7.7* 9.0*  BILIDIR 4.9*  --    PT/INR Recent Labs    08/11/23 0549 08/12/23 0516  LABPROT 13.3 13.3  INR 1.0 1.0   Hepatitis Panel Recent Labs    08/10/23 2107  HEPBSAG NON REACTIVE  HCVAB NON REACTIVE  HEPAIGM NON REACTIVE  HEPBIGM NON REACTIVE    Studies/Results: CT ABDOMEN PELVIS W CONTRAST  Result Date: 08/11/2023 CLINICAL DATA:  42 year old female with dark urine, jaundice. Abnormal LFTs. On antibiotics for UTI. History of a mixed connective tissue disorder. Right upper quadrant pain. Abdomen and flank pain. EXAM: CT ABDOMEN AND PELVIS WITH CONTRAST TECHNIQUE: Multidetector CT imaging of the abdomen and pelvis was performed using the standard protocol following bolus administration of intravenous contrast. RADIATION DOSE REDUCTION: This exam was performed according to the departmental dose-optimization program which includes automated exposure control, adjustment of the mA and/or kV according to patient size and/or use of iterative reconstruction technique. CONTRAST:  OMNIPAQUE IOHEXOL 300 MG/ML  SOLN COMPARISON:  Abdomen ultrasound yesterday. CT Abdomen and Pelvis 02/09/2015. FINDINGS: Lower chest: No cardiomegaly, pericardial or pleural effusion. Lung bases appear negative except for mild lower lobe curvilinear scarring or atelectasis. Hepatobiliary: Coarse granular appearance of the liver parenchyma (series 2, image 6) but no hepatomegaly or discrete liver lesion. Hepatic veins and portal veins appear  to be patent and appear fairly unremarkable. The gallbladder is partially contracted and unremarkable. No biliary ductal dilatation. No perihepatic free fluid. Pancreas: Negative.  No pancreatic ductal dilatation or atrophy. Spleen: Diminutive spleen although with a similar course enhancement pattern as that seen in the liver (series 2, image 4). No perisplenic  fluid. Adrenals/Urinary Tract: Negative adrenal glands and nonobstructed kidneys. No delayed renal images. Small right renal mid pole benign-appearing cyst (no follow-up imaging recommended). Diminutive bladder. Occasional pelvic phleboliths. Stomach/Bowel: No dilated bowel loops. Hyperdense retained stool in the large bowel. There is trace free fluid in the right lower quadrant adjacent to the lateral cecum on series 8, image 54. The appendix arising medial from the cecum appears normal on coronal image 59. No convincing large bowel inflammation. Decompressed stomach and duodenum. No free air. Vascular/Lymphatic: Portal venous system appears to be patent and nondilated. Major arterial structures in the abdomen and pelvis appear patent and normal. No calcified atherosclerosis or lymphadenopathy identified. Reproductive: There is a round, lobulated, heterogeneously hyperdense soft tissue mass in the anterior pelvis overlying the urinary bladder which is new from 2016, although appears inseparable from and likely exophytic from the ventral uterus on sagittal image 96. The exophytic mass is 75 x 89 x 72 mm (AP by transverse by CC) mild regional mass effect. No regional inflammation. Evidence of a small additional left uterine fundal fibroid on sagittal image 94. Uterus and adnexa otherwise appear negative. Other: No small volume of simple fluid density free fluid in the pelvis, primarily the cul-de-sac on series 2, image 62. Musculoskeletal: No acute or suspicious osseous lesion identified. IMPRESSION: 1. Coarse granular appearance of both the liver and spleen parenchyma. But no associated hepatosplenomegaly. No discrete liver lesion. No stigmata of portal venous hypertension. And no gallbladder or biliary ductal dilatation. Etiology is unclear, but might reflect an intrinsic hepatocellular disease. Liver and spleen involvement by the underlying connective tissue disorder not excluded. 2. There is trace free fluid in  the abdomen and pelvis with simple fluid density. No other acute or inflammatory process identified in the abdomen. 3. New since 2016 rounded midline pelvic mass measuring up to 8.9 cm diameter located on top of the urinary bladder. But favor this is a large subserosal fibroid exophytic from the ventral uterus. A smaller uterine fundal fibroid is also identified. Electronically Signed   By: Odessa Fleming M.D.   On: 08/11/2023 07:20   US Abdomen Limited RUQ (LIVER/GB)  Result Date: 08/10/2023 CLINICAL DATA:  Vomiting EXAM: ULTRASOUND ABDOMEN LIMITED RIGHT UPPER QUADRANT COMPARISON:  Abdominal ultrasound 05/10/2017 FINDINGS: Gallbladder: No gallstones or wall thickening visualized. No sonographic Murphy sign noted by sonographer. Common bile duct: Diameter: 2.7 mm Liver: No focal lesion identified. Within normal limits in parenchymal echogenicity. Portal vein is patent on color Doppler imaging with normal direction of blood flow towards the liver. Other: None. IMPRESSION: Normal right upper quadrant ultrasound. Electronically Signed   By: Darliss Cheney M.D.   On: 08/10/2023 22:14      LOS: 2 days   Judie Petit T. Russella Dar, MD 08/12/2023, 10:18 AM See [facility], to contact our on call provider

## 2023-08-12 NOTE — Progress Notes (Signed)
PROGRESS NOTE Michelle Rojas  WGN:[id number] DOB: 02/04/1981 DOA: 08/10/2023 PCP: [facility]  Brief Narrative/Hospital Course: 42 old female with history of mixed connective tissue disorder diagnosed 2015, anxiety, fibromyalgia, GERD, hypertension presented to the ED with abnormal lab with elevated LFTs.  Patient had been nauseous past week and developing itching, some vomiting, dark urine, seen at the urgent care, thought she was having UTI but symptoms did not improve despite antibiotics and persisted with nausea vomiting, seen by PCP on 9/19 labs obtained showed elevated LFTs and sent to the ED. Patient has occasional alcohol use but denies daily drinking, no new medication has been getting injectable medication for MCTD for years and no excessive use of Tylenol reported. In the ED hemodynamically stable, labs confirm abnormal LFTs, RUQ ultrasound-normal right upper quadrant, no gallstone or wall thickening, CBD 2.7 mm no focal lesion of the liver portal vein is patent Patient was admitted> and underwent CT abdomen pelvis with contrast> shows coarse granular appearance in both the liver and spleen parenchyma no hepatosplenomegaly no discrete liver lesion, trace fluid in the abdomen, new since 2016 rounded midline pelvic mass measuring up to 8.9 cm located on top of the urinary bladder favor large soft serosal fibroid exophytic from the ventral uterus. Seen by gastroenterology for acute hepatitis, acute hepatitis viral panel negative, imaging studies negative > ordered EBV, CMV HSV panel, question DILI (?  Cephalexin, Augmentin), autoimmune etiologies.  Patient has been overall asymptomatic besides mild nausea and itching, tolerating diet.  If LFTs downtrending GI advised to discharge the patient for outpatient follow-up-instructed to avoid any hepatotoxic medication alcohol.    Subjective: Patient seen and examined this morning  She is trying some applesauce reported vomiting  yesterday  Complains of itching Labs shows elevated LFTs still Assessment and Plan: Principal Problem:   Jaundice Active Problems:   Essential hypertension   Transaminitis   Right upper quadrant pain   Hypokalemia   Allergic rhinitis   Jaundice with abnormal LFTs/Transaminitis Nausea vomiting from hepatitis: No obvious etiology.Seen by gastroenterology for acute hepatitis, acute hepatitis viral panel negative, imaging studies negative > ordered EBV, CMV HSV panel, question DILI (?  Cephalexin, Augmentin), autoimmune etiologies.  Patient has been overall asymptomatic besides mild nausea and itching, tolerating diet.  If LFTs downtrending GI advised to discharge the patient for outpatient follow-up-instructed to avoid any hepatotoxic medication alcohol.INR normal.  GI will following closely, advance diet as tolerated, continue to monitor LFTs MELD score as below.  Continue Reglan PPI/Pepcid as per GI Recent Labs  Lab 08/10/23 1610 08/10/23 2107 08/10/23 2204 08/11/23 0549 08/12/23 0516 08/12/23 0720  AST 1,191*  --   --  1,272*  --  1,127*  ALT 1,822*  --   --  1,763*  --  1,722*  ALKPHOS 231*  --   --  214*  --  226*  BILITOT 7.3*  --   --  7.7*  --  9.0*  BILIDIR  --   --   --  4.9*  --   --   PROT 7.9  --   --  7.1  --  7.1  ALBUMIN 3.9  --   --  3.5  --  3.7  AMMONIA  --   --  21  --   --   --   INR  --  0.9  --  1.0 1.0  --   LIPASE 21  --   --   --   --   --  PLT 291  --   --  274  --   --       Latest Ref Rng & Units 08/10/2023    9:07 PM  Hepatitis  Hep B Surface Ag NON REACTIVE NON REACTIVE   Hep B IgM NON REACTIVE NON REACTIVE   Hep C Ab NON REACTIVE NON REACTIVE   Hep A IgM NON REACTIVE NON REACTIVE    MELD 3.0: 17 at 08/12/2023  7:20 AM MELD-Na: 15 at 08/12/2023  7:20 AM Calculated from: Serum Creatinine: 0.67 mg/dL (Using min of 1 mg/dL) at 02/27/8118  1:47 AM Serum Sodium: 138 mmol/L (Using max of 137 mmol/L) at 08/12/2023  7:20 AM Total Bilirubin: 9.0 mg/dL  at 07/19/5620  3:08 AM Serum Albumin: 3.7 g/dL (Using max of 3.5 g/dL) at 6/57/8469  6:29 AM INR(ratio): 1.0 at 08/12/2023  5:16 AM Age at listing (hypothetical): 42 years Sex: Female at 08/12/2023  7:20 AM  Hypokalemia: Resolved  Hypertension: well controlled on amlodipine  Allergic rhinitis: Continue Flonase  Mixed continue tissue disorder: She is on Belimumab infusion as outpatient.  Obesity:Patient's Body mass index is 31.78 kg/m. Will benefit with PCP follow-up, weight loss  healthy lifestyle and sleep apnea evaluation as outpatient   DVT prophylaxis: SCDs Start: 08/10/23 2229 Code Status:   Code Status: Full Code Family Communication: plan of care discussed with patient at bedside. Patient status is: Inpatient because of liver dysfunction Level of care: Med-Surg   Dispo: The patient is from: home            Anticipated disposition: TBD once okay with GI and LFTs stable/downtrending Objective: Vitals last 24 hrs: Vitals:   08/11/23 2111 08/12/23 0137 08/12/23 0500 08/12/23 0509  BP: (!) 154/97 126/87  128/81  Pulse: 71 75  73  Resp: 16 16  16   Temp: 99 F (37.2 C) 97.8 F (36.6 C)  98.4 F (36.9 C)  TempSrc: Oral Oral  Oral  SpO2: 100% 97%  100%  Weight:   89.3 kg   Height:       Weight change: -1.465 kg  Physical Examination: General exam: alert awake, oriented  HEENT:Oral mucosa moist, Ear/Nose WNL grossly Respiratory system: Bilaterally clear BS,no use of accessory muscle Cardiovascular system: S1 & S2 +, No JVD. Gastrointestinal system: Abdomen soft,NT,ND, BS+ Nervous System: Alert, awake, moving all extremities,and following commands. Extremities: LE edema neg,distal peripheral pulses palpable and warm.  Skin: No rashes,+++ icterus. MSK: Normal muscle bulk,tone, power   Medications reviewed:  Scheduled Meds:  amLODipine  10 mg Oral Daily   famotidine  20 mg Oral BID   fluticasone  1 spray Each Nare Daily   metoCLOPramide  5 mg Oral TID AC & HS    Continuous Infusions:  lactated ringers 100 mL/hr at 08/12/23 1041    Diet Order             Diet full liquid Room service appropriate? Yes; Fluid consistency: Thin  Diet effective now                  Intake/Output Summary (Last 24 hours) at 08/12/2023 1135 Last data filed at 08/12/2023 5284 Gross per 24 hour  Intake 2153.37 ml  Output 1500 ml  Net 653.37 ml   Net IO Since Admission: 653.37 mL [08/12/23 1135]  Wt Readings from Last 3 Encounters:  08/12/23 89.3 kg  02/07/19 91.2 kg  09/17/18 95.3 kg     Unresulted Labs (From admission, onward)     Start  Ordered   08/13/23 0500  Comprehensive metabolic panel  Daily,   R      08/12/23 0631   08/11/23 1522  IgG  Once,   R        08/11/23 1521   08/11/23 1522  ANA  Add-on,   AD        08/11/23 1522   08/11/23 1522  Anti-smooth muscle antibody, IgG  Add-on,   AD        08/11/23 1522   08/11/23 1507  EBV ab to viral capsid ag pnl, IgG+IgM  Add-on,   AD        08/11/23 1506   08/11/23 1507  CMV IgM  Once,   R        08/11/23 1506          Data Reviewed: I have personally reviewed following labs and imaging studies CBC: Recent Labs  Lab 08/10/23 1610 08/11/23 0549  WBC 3.4* 3.3*  NEUTROABS  --  1.0*  HGB 12.4 12.2  HCT 38.9 38.0  MCV 79.4* 79.2*  PLT 291 274   Basic Metabolic Panel: Recent Labs  Lab 08/10/23 1610 08/10/23 2204 08/11/23 0549 08/12/23 0720  NA 137  --  136 138  K 3.1*  --  3.6 3.5  CL 100  --  104 105  CO2 27  --  24 26  GLUCOSE 89  --  81 96  BUN 8  --  7 <5*  CREATININE 0.66  --  0.44 0.67  CALCIUM 9.2  --  8.5* 8.9  MG  --  1.9 1.8  --    GFR: Estimated Creatinine Clearance: 101 mL/min (by C-G formula based on SCr of 0.67 mg/dL). Liver Function Tests: Recent Labs  Lab 08/10/23 1610 08/11/23 0549 08/12/23 0720  AST 1,191* 1,272* 1,127*  ALT 1,822* 1,763* 1,722*  ALKPHOS 231* 214* 226*  BILITOT 7.3* 7.7* 9.0*  PROT 7.9 7.1 7.1  ALBUMIN 3.9 3.5 3.7   Recent Labs   Lab 08/10/23 1610  LIPASE 21   Recent Labs  Lab 08/10/23 2204  AMMONIA 21  Coagulation Profile: Recent Labs  Lab 08/10/23 2107 08/11/23 0549 08/12/23 0516  INR 0.9 1.0 1.0  No results found for this or any previous visit (from the past 240 hour(s)).  Antimicrobials: Anti-infectives (From admission, onward)    None      Culture/Microbiology    Component Value Date/Time   SDES URINE, CLEAN CATCH 09/02/2017 1710   SPECREQUEST NONE 09/02/2017 1710   CULT (A) 09/02/2017 1710    MULTIPLE SPECIES PRESENT, SUGGEST RECOLLECTION NO GROUP B STREP (S.AGALACTIAE) ISOLATED Performed at [facility], [address] [place], [place] [address]    REPTSTATUS 09/05/2017 FINAL 09/02/2017 1710    Radiology Studies: CT ABDOMEN PELVIS W CONTRAST  Result Date: 08/11/2023 CLINICAL DATA:  42 year old female with dark urine, jaundice. Abnormal LFTs. On antibiotics for UTI. History of a mixed connective tissue disorder. Right upper quadrant pain. Abdomen and flank pain. EXAM: CT ABDOMEN AND PELVIS WITH CONTRAST TECHNIQUE: Multidetector CT imaging of the abdomen and pelvis was performed using the standard protocol following bolus administration of intravenous contrast. RADIATION DOSE REDUCTION: This exam was performed according to the departmental dose-optimization program which includes automated exposure control, adjustment of the mA and/or kV according to patient size and/or use of iterative reconstruction technique. CONTRAST:  OMNIPAQUE IOHEXOL 300 MG/ML  SOLN COMPARISON:  Abdomen ultrasound yesterday. CT Abdomen and Pelvis 02/09/2015. FINDINGS: Lower chest: No  cardiomegaly, pericardial or pleural effusion. Lung bases appear negative except for mild lower lobe curvilinear scarring or atelectasis. Hepatobiliary: Coarse granular appearance of the liver parenchyma (series 2, image 6) but no hepatomegaly or discrete liver lesion. Hepatic veins and portal veins appear to be patent and appear  fairly unremarkable. The gallbladder is partially contracted and unremarkable. No biliary ductal dilatation. No perihepatic free fluid. Pancreas: Negative.  No pancreatic ductal dilatation or atrophy. Spleen: Diminutive spleen although with a similar course enhancement pattern as that seen in the liver (series 2, image 4). No perisplenic fluid. Adrenals/Urinary Tract: Negative adrenal glands and nonobstructed kidneys. No delayed renal images. Small right renal mid pole benign-appearing cyst (no follow-up imaging recommended). Diminutive bladder. Occasional pelvic phleboliths. Stomach/Bowel: No dilated bowel loops. Hyperdense retained stool in the large bowel. There is trace free fluid in the right lower quadrant adjacent to the lateral cecum on series 8, image 54. The appendix arising medial from the cecum appears normal on coronal image 59. No convincing large bowel inflammation. Decompressed stomach and duodenum. No free air. Vascular/Lymphatic: Portal venous system appears to be patent and nondilated. Major arterial structures in the abdomen and pelvis appear patent and normal. No calcified atherosclerosis or lymphadenopathy identified. Reproductive: There is a round, lobulated, heterogeneously hyperdense soft tissue mass in the anterior pelvis overlying the urinary bladder which is new from 2016, although appears inseparable from and likely exophytic from the ventral uterus on sagittal image 96. The exophytic mass is 75 x 89 x 72 mm (AP by transverse by CC) mild regional mass effect. No regional inflammation. Evidence of a small additional left uterine fundal fibroid on sagittal image 94. Uterus and adnexa otherwise appear negative. Other: No small volume of simple fluid density free fluid in the pelvis, primarily the cul-de-sac on series 2, image 62. Musculoskeletal: No acute or suspicious osseous lesion identified. IMPRESSION: 1. Coarse granular appearance of both the liver and spleen parenchyma. But no  associated hepatosplenomegaly. No discrete liver lesion. No stigmata of portal venous hypertension. And no gallbladder or biliary ductal dilatation. Etiology is unclear, but might reflect an intrinsic hepatocellular disease. Liver and spleen involvement by the underlying connective tissue disorder not excluded. 2. There is trace free fluid in the abdomen and pelvis with simple fluid density. No other acute or inflammatory process identified in the abdomen. 3. New since 2016 rounded midline pelvic mass measuring up to 8.9 cm diameter located on top of the urinary bladder. But favor this is a large subserosal fibroid exophytic from the ventral uterus. A smaller uterine fundal fibroid is also identified. Electronically Signed   By: Odessa Fleming M.D.   On: 08/11/2023 07:20   US Abdomen Limited RUQ (LIVER/GB)  Result Date: 08/10/2023 CLINICAL DATA:  Vomiting EXAM: ULTRASOUND ABDOMEN LIMITED RIGHT UPPER QUADRANT COMPARISON:  Abdominal ultrasound 05/10/2017 FINDINGS: Gallbladder: No gallstones or wall thickening visualized. No sonographic Murphy sign noted by sonographer. Common bile duct: Diameter: 2.7 mm Liver: No focal lesion identified. Within normal limits in parenchymal echogenicity. Portal vein is patent on color Doppler imaging with normal direction of blood flow towards the liver. Other: None. IMPRESSION: Normal right upper quadrant ultrasound. Electronically Signed   By: Darliss Cheney M.D.   On: 08/10/2023 22:14     LOS: 2 days   Lanae Boast, MD [facility]  08/12/2023, 11:35 AM

## 2023-08-13 DIAGNOSIS — B179 Acute viral hepatitis, unspecified: Secondary | ICD-10-CM | POA: Diagnosis not present

## 2023-08-13 DIAGNOSIS — R7989 Other specified abnormal findings of blood chemistry: Secondary | ICD-10-CM | POA: Diagnosis not present

## 2023-08-13 DIAGNOSIS — R17 Unspecified jaundice: Secondary | ICD-10-CM | POA: Diagnosis not present

## 2023-08-13 LAB — COMPREHENSIVE METABOLIC PANEL
ALT: 1646 U/L — ABNORMAL HIGH (ref 0–44)
AST: 1172 U/L — ABNORMAL HIGH (ref 15–41)
Albumin: 3.2 g/dL — ABNORMAL LOW (ref 3.5–5.0)
Alkaline Phosphatase: 203 U/L — ABNORMAL HIGH (ref 38–126)
Anion gap: 7 (ref 5–15)
BUN: <5 mg/dL — ABNORMAL LOW (ref 6–20)
CO2: 28 mmol/L (ref 22–32)
Calcium: 8.7 mg/dL — ABNORMAL LOW (ref 8.9–10.3)
Chloride: 103 mmol/L (ref 98–111)
Creatinine, Ser: 0.67 mg/dL (ref 0.44–1.00)
GFR, Estimated: >60 mL/min (ref >60)
Glucose, Bld: 90 mg/dL (ref 70–99)
Potassium: 3.5 mmol/L (ref 3.5–5.1)
Sodium: 138 mmol/L (ref 135–145)
Total Bilirubin: 9 mg/dL — ABNORMAL HIGH (ref 0.3–1.2)
Total Protein: 6.6 g/dL (ref 6.5–8.1)

## 2023-08-13 MED ORDER — SENNOSIDES-DOCUSATE SODIUM 8.6-50 MG PO TABS
1.0000 | ORAL_TABLET | Freq: Two times a day (BID) | ORAL | Status: DC
  Administered 2023-08-13 – 2023-08-17 (×8): 1 via ORAL
  Filled 2023-08-13 (×8): qty 1

## 2023-08-13 MED ORDER — POLYETHYLENE GLYCOL 3350 17 G PO PACK: 17.0000 g | PACK | Freq: Every day | ORAL | Status: DC | PRN

## 2023-08-13 NOTE — Progress Notes (Addendum)
    Progress Note   Assessment    Hepatitis - hepatocellular injury pattern. 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 Monospot negative. R/O autoimmune, EBV, CMV, HSV, DILI (? Cephalexin, Augmentin), Wilson disease.  AST, ALT, alk phos, t. bili stable compared to yesterday.    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 Shawnee Knapp office Monday to review prior ANA and other autoimmune marker results Consider liver biopsy Continue metoclopramide 5 mg ac & hs and famotidine 20 mg bid   Chief Complaint   Nausea improved.  No vomiting since yesterday.  Patient's husband at bedside.  Vital signs in last 24 hours: Temp:  [98 F (36.7 C)-98.4 F (36.9 C)] 98 F (36.7 C) (09/22 0507) Pulse Rate:  [64-83] 65 (09/22 0507) Resp:  [16] 16 (09/22 0507) BP: (132-170)/(80-111) 132/80 (09/22 0507) SpO2:  [99 %-100 %] 100 % (09/22 0507) Weight:  [90.5 kg] 90.5 kg (09/22 0500) Last BM Date : 08/10/23  General: Alert, well-developed, in NAD HEENT: scleral icterus Heart:  Regular rate and rhythm; no murmurs Chest: Clear to ascultation bilaterally Abdomen:  Soft, nontender and nondistended. Normal bowel sounds, without guarding, and without rebound.   Extremities:  Without edema. Neurologic:  Alert and  oriented x4; grossly normal neurologically. Psych:  Alert and cooperative. Normal mood and affect.  Intake/Output from previous day: 09/21 0701 - 09/22 0700 In: 4501.7 [P.O.:1560; I.V.:2941.7] Out: 2350 [Urine:2350] Intake/Output this shift: Total I/O In: 120 [P.O.:120] Out: 500 [Urine:500]  Lab Results: Recent Labs    08/10/23 1610 08/11/23 0549  WBC 3.4* 3.3*  HGB 12.4 12.2  HCT 38.9 38.0  PLT 291 274   BMET Recent Labs    08/11/23 0549 08/12/23 0720 08/13/23 0517  NA 136 138 138  K 3.6 3.5 3.5  CL 104 105 103  CO2 24 26 28   GLUCOSE 81 96  90  BUN 7 <5* <5*  CREATININE 0.44 0.67 0.67  CALCIUM 8.5* 8.9 8.7*   LFT Recent Labs    08/11/23 0549 08/12/23 0720 08/13/23 0517  PROT 7.1   < > 6.6  ALBUMIN 3.5   < > 3.2*  AST 1,272*   < > 1,172*  ALT 1,763*   < > 1,646*  ALKPHOS 214*   < > 203*  BILITOT 7.7*   < > 9.0*  BILIDIR 4.9*  --   --    < > = values in this interval not displayed.   PT/INR Recent Labs    08/11/23 0549 08/12/23 0516  LABPROT 13.3 13.3  INR 1.0 1.0   Hepatitis Panel Recent Labs    08/10/23 2107  HEPBSAG NON REACTIVE  HCVAB NON REACTIVE  HEPAIGM NON REACTIVE  HEPBIGM NON REACTIVE     LOS: 3 days   Erinn Mendosa T. Russella Dar, MD 08/13/2023, 10:35 AM See [facility], to contact our on call provider

## 2023-08-13 NOTE — Progress Notes (Signed)
PROGRESS NOTE Michelle Rojas  ZOX:[id number] DOB: [birth year] DOA: 08/10/2023 PCP: [facility]  Brief Narrative/Hospital Course: 30 old female with history of mixed connective tissue disorder diagnosed 2015, anxiety, fibromyalgia, GERD, hypertension presented to the ED with abnormal lab with elevated LFTs.  Patient had been nauseous past week and developing itching, some vomiting, dark urine, seen at the urgent care, thought she was having UTI but symptoms did not improve despite antibiotics and persisted with nausea vomiting, seen by PCP on 9/19 labs obtained showed elevated LFTs and sent to the ED. Patient has occasional alcohol use but denies daily drinking, no new medication has been getting injectable medication for MCTD for years and no excessive use of Tylenol reported. In the ED hemodynamically stable, labs confirm abnormal LFTs, RUQ ultrasound-normal right upper quadrant, no gallstone or wall thickening, CBD 2.7 mm no focal lesion of the liver portal vein is patent Patient was admitted> and underwent CT abdomen pelvis with contrast> shows coarse granular appearance in both the liver and spleen parenchyma no hepatosplenomegaly no discrete liver lesion, trace fluid in the abdomen, new since 2016 rounded midline pelvic mass measuring up to 8.9 cm located on top of the urinary bladder favor large soft serosal fibroid exophytic from the ventral uterus.    Subjective: Seen and examined this morning Husband at the bedside No longer nausea and vomiting Complains of constipation Overnight afebrile BP stable  LFTs slightly improving AST ALT about to be increased to 9  Assessment and Plan: Principal Problem:   Jaundice Active Problems:   Essential hypertension   Transaminitis   Right upper quadrant pain   Hypokalemia   Allergic rhinitis   Acute hepatitis w/ Jaundice Nausea vomiting: No obvious etiology.GI following closely.  Workup so far acute viral hepatitis panel  negative,imaging studies negative question DILI (?  Cephalexin, Augmentin).  PT/INR stable, multiple serologies ordered per GI so far  AN positive, IgG 1908> possibly autoimmune hepatitis, still workup pending with serology including  mononucleosis negative,  HSV DNA/EBV/CMVAnti SmoothAb.  Discussed with GI this morning-they are planning to get patient's outpatient labs from Dr. Dierdre Forth 9/23. Continue to trend LFTs once level downtrending can discharge and outpatient follow-up for inductively remains elevated AST ALT slightly improving  Continue symptomatic management with Reglan Pepcid  Continue to avoid nephrotoxic medication Recent Labs  Lab 08/10/23 1610 08/10/23 2107 08/10/23 2204 08/11/23 0549 08/12/23 0516 08/12/23 0720 08/13/23 0517  AST 1,191*  --   --  1,272*  --  1,127* 1,172*  ALT 1,822*  --   --  1,763*  --  1,722* 1,646*  ALKPHOS 231*  --   --  214*  --  226* 203*  BILITOT 7.3*  --   --  7.7*  --  9.0* 9.0*  BILIDIR  --   --   --  4.9*  --   --   --   PROT 7.9  --   --  7.1  --  7.1 6.6  ALBUMIN 3.9  --   --  3.5  --  3.7 3.2*  AMMONIA  --   --  21  --   --   --   --   INR  --  0.9  --  1.0 1.0  --   --   LIPASE 21  --   --   --   --   --   --   PLT 291  --   --  274  --   --   --  Latest Ref Rng & Units 08/10/2023    9:07 PM  Hepatitis  Hep B Surface Ag NON REACTIVE NON REACTIVE   Hep B IgM NON REACTIVE NON REACTIVE   Hep C Ab NON REACTIVE NON REACTIVE   Hep A IgM NON REACTIVE NON REACTIVE    MELD 3.0: 18 at 08/13/2023  5:17 AM MELD-Na: 15 at 08/13/2023  5:17 AM Calculated from: Serum Creatinine: 0.67 mg/dL (Using min of 1 mg/dL) at 1/61/0960  4:54 AM Serum Sodium: 138 mmol/L (Using max of 137 mmol/L) at 08/13/2023  5:17 AM Total Bilirubin: 9.0 mg/dL at 0/98/1191  4:78 AM Serum Albumin: 3.2 g/dL at 2/95/6213  0:86 AM INR(ratio): 1.0 at 08/12/2023  5:16 AM Age at listing (hypothetical): 42 years Sex: Female at 08/13/2023  5:17  AM  Hypokalemia: Resolved  Hypertension: well controlled on amlodipine  Allergic rhinitis: Continue Flonase prn  Mixed continue tissue disorder: She is on Belimumab infusion weekly by Dr Dierdre Forth,  Constipation: Added Senokot twice daily and MiraLAX as needed  Obesity w/ Body mass index is 32.2 kg/m. Will benefit with PCP follow-up, weight loss  healthy lifestyle and sleep apnea evaluation as outpatient   DVT prophylaxis: SCDs Start: 08/10/23 2229 Code Status:   Code Status: Full Code Family Communication: plan of care discussed with patient at bedside. Patient status is: Inpatient because of liver dysfunction Level of care: Med-Surg   Dispo: The patient is from: home            Anticipated disposition: TBD once okay with GI and LFTs stable/downtrending Objective: Vitals last 24 hrs: Vitals:   08/12/23 2024 08/13/23 0500 08/13/23 0507 08/13/23 1300  BP: (!) 149/99  132/80 (!) 157/102  Pulse: 64  65 67  Resp: 16  16 14   Temp: 98.4 F (36.9 C)  98 F (36.7 C) 98.9 F (37.2 C)  TempSrc: Oral  Oral Oral  SpO2: 100%  100% 100%  Weight:  90.5 kg    Height:       Weight change: 1.2 kg  Physical Examination: General exam: alert awake, oriented x3 HEENT:Oral mucosa moist, Ear/Nose WNL grossly Respiratory system: Bilaterally clear BS,no use of accessory muscle Cardiovascular system: S1 & S2 +, No JVD. Gastrointestinal system: Abdomen soft,NT,ND, BS+ Nervous System: Alert, awake, moving all extremities,and following commands. Extremities: LE edema neg,distal peripheral pulses palpable and warm.  Skin: No rashes,+++icterus. MSK: Normal muscle bulk,tone, power   Medications reviewed:  Scheduled Meds:  amLODipine  10 mg Oral Daily   famotidine  20 mg Oral BID   fluticasone  1 spray Each Nare Daily   metoCLOPramide  5 mg Oral TID AC & HS   senna-docusate  1 tablet Oral BID   Continuous Infusions:  lactated ringers 50 mL/hr at 08/13/23 1146    Diet Order              Diet full liquid Room service appropriate? Yes; Fluid consistency: Thin  Diet effective now                  Intake/Output Summary (Last 24 hours) at 08/13/2023 1416 Last data filed at 08/13/2023 1000 Gross per 24 hour  Intake 2140 ml  Output 2650 ml  Net -510 ml   Net IO Since Admission: 2,625.04 mL [08/13/23 1416]  Wt Readings from Last 3 Encounters:  08/13/23 90.5 kg  02/07/19 91.2 kg  09/17/18 95.3 kg     Unresulted Labs (From admission, onward)     Start  Ordered   08/14/23 0500  Protime-INR  Daily,   R      08/13/23 0805   08/14/23 0500  Ceruloplasmin  Tomorrow morning,   R        08/13/23 1044   08/13/23 0500  Comprehensive metabolic panel  Daily,   R      08/12/23 0631   08/13/23 0500  HSV DNA by PCR (reference lab) Blood  Tomorrow morning,   R        08/12/23 1441   08/11/23 1522  Anti-smooth muscle antibody, IgG  Add-on,   AD        08/11/23 1522   08/11/23 1507  EBV ab to viral capsid ag pnl, IgG+IgM  Add-on,   AD        08/11/23 1506   08/11/23 1507  CMV IgM  Once,   R        08/11/23 1506          Data Reviewed: I have personally reviewed following labs and imaging studies CBC: Recent Labs  Lab 08/10/23 1610 08/11/23 0549  WBC 3.4* 3.3*  NEUTROABS  --  1.0*  HGB 12.4 12.2  HCT 38.9 38.0  MCV 79.4* 79.2*  PLT 291 274   Basic Metabolic Panel: Recent Labs  Lab 08/10/23 1610 08/10/23 2204 08/11/23 0549 08/12/23 0720 08/13/23 0517  NA 137  --  136 138 138  K 3.1*  --  3.6 3.5 3.5  CL 100  --  104 105 103  CO2 27  --  24 26 28   GLUCOSE 89  --  81 96 90  BUN 8  --  7 <5* <5*  CREATININE 0.66  --  0.44 0.67 0.67  CALCIUM 9.2  --  8.5* 8.9 8.7*  MG  --  1.9 1.8  --   --    GFR: Estimated Creatinine Clearance: 101.7 mL/min (by C-G formula based on SCr of 0.67 mg/dL). Liver Function Tests: Recent Labs  Lab 08/10/23 1610 08/11/23 0549 08/12/23 0720 08/13/23 0517  AST 1,191* 1,272* 1,127* 1,172*  ALT 1,822* 1,763* 1,722*  1,646*  ALKPHOS 231* 214* 226* 203*  BILITOT 7.3* 7.7* 9.0* 9.0*  PROT 7.9 7.1 7.1 6.6  ALBUMIN 3.9 3.5 3.7 3.2*   Recent Labs  Lab 08/10/23 1610  LIPASE 21   Recent Labs  Lab 08/10/23 2204  AMMONIA 21  Coagulation Profile: Recent Labs  Lab 08/10/23 2107 08/11/23 0549 08/12/23 0516  INR 0.9 1.0 1.0  No results found for this or any previous visit (from the past 240 hour(s)).  Antimicrobials: Anti-infectives (From admission, onward)    None      Culture/Microbiology    Component Value Date/Time   SDES URINE, CLEAN CATCH 09/02/2017 1710   SPECREQUEST NONE 09/02/2017 1710   CULT (A) 09/02/2017 1710    MULTIPLE SPECIES PRESENT, SUGGEST RECOLLECTION NO GROUP B STREP (S.AGALACTIAE) ISOLATED Performed at [facility], [address], [place], [place] [address]    REPTSTATUS 09/05/2017 FINAL 09/02/2017 1710    Radiology Studies: No results found.   LOS: 3 days   Lanae Boast, MD [facility]  08/13/2023, 2:16 PM

## 2023-08-14 DIAGNOSIS — R112 Nausea with vomiting, unspecified: Secondary | ICD-10-CM | POA: Diagnosis not present

## 2023-08-14 DIAGNOSIS — L299 Pruritus, unspecified: Secondary | ICD-10-CM

## 2023-08-14 DIAGNOSIS — B179 Acute viral hepatitis, unspecified: Secondary | ICD-10-CM | POA: Diagnosis not present

## 2023-08-14 DIAGNOSIS — R17 Unspecified jaundice: Secondary | ICD-10-CM | POA: Diagnosis not present

## 2023-08-14 LAB — PROTIME-INR
INR: 1 (ref 0.8–1.2)
Prothrombin Time: 13.3 seconds (ref 11.4–15.2)

## 2023-08-14 LAB — COMPREHENSIVE METABOLIC PANEL
ALT: 1549 U/L — ABNORMAL HIGH (ref 0–44)
AST: 1117 U/L — ABNORMAL HIGH (ref 15–41)
Albumin: 3.2 g/dL — ABNORMAL LOW (ref 3.5–5.0)
Alkaline Phosphatase: 190 U/L — ABNORMAL HIGH (ref 38–126)
Anion gap: 10 (ref 5–15)
BUN: <5 mg/dL — ABNORMAL LOW (ref 6–20)
CO2: 26 mmol/L (ref 22–32)
Calcium: 8.9 mg/dL (ref 8.9–10.3)
Chloride: 103 mmol/L (ref 98–111)
Creatinine, Ser: 0.6 mg/dL (ref 0.44–1.00)
GFR, Estimated: >60 mL/min (ref >60)
Glucose, Bld: 86 mg/dL (ref 70–99)
Potassium: 3.4 mmol/L — ABNORMAL LOW (ref 3.5–5.1)
Sodium: 139 mmol/L (ref 135–145)
Total Bilirubin: 9.3 mg/dL — ABNORMAL HIGH (ref 0.3–1.2)
Total Protein: 6.6 g/dL (ref 6.5–8.1)

## 2023-08-14 MED ORDER — PANTOPRAZOLE SODIUM 40 MG PO TBEC
40.0000 mg | DELAYED_RELEASE_TABLET | Freq: Every day | ORAL | Status: DC
  Administered 2023-08-14 – 2023-08-17 (×3): 40 mg via ORAL
  Filled 2023-08-14 (×3): qty 1

## 2023-08-14 MED ORDER — ONDANSETRON HCL 4 MG/2ML IJ SOLN
4.0000 mg | Freq: Three times a day (TID) | INTRAMUSCULAR | Status: AC
  Administered 2023-08-14 – 2023-08-15 (×3): 4 mg via INTRAVENOUS
  Filled 2023-08-14 (×3): qty 2

## 2023-08-14 MED ORDER — POTASSIUM CHLORIDE CRYS ER 20 MEQ PO TBCR
20.0000 meq | EXTENDED_RELEASE_TABLET | Freq: Once | ORAL | Status: AC
  Administered 2023-08-14: 20 meq via ORAL
  Filled 2023-08-14: qty 1

## 2023-08-14 MED ORDER — PROMETHAZINE HCL 25 MG PO TABS
25.0000 mg | ORAL_TABLET | Freq: Four times a day (QID) | ORAL | Status: DC | PRN
  Administered 2023-08-15: 25 mg via ORAL
  Filled 2023-08-14: qty 1

## 2023-08-14 MED ORDER — DIPHENHYDRAMINE HCL 25 MG PO CAPS: 25.0000 mg | ORAL_CAPSULE | Freq: Four times a day (QID) | ORAL | Status: DC | PRN

## 2023-08-14 NOTE — Progress Notes (Signed)
PROGRESS NOTE Michelle Rojas  WUJ:[id number] DOB: [birth year] DOA: 08/10/2023 PCP: [facility]  Brief Narrative/Hospital Course: 66 old female with history of mixed connective tissue disorder diagnosed 2015, anxiety, fibromyalgia, GERD, hypertension presented to the ED with abnormal lab with elevated LFTs.  Patient had been nauseous past week and developing itching, some vomiting, dark urine, seen at the urgent care, thought she was having UTI but symptoms did not improve despite antibiotics and persisted with nausea vomiting, seen by PCP on 9/19 labs obtained showed elevated LFTs and sent to the ED. Patient has occasional alcohol use but denies daily drinking, no new medication has been getting injectable medication for MCTD for years and no excessive use of Tylenol reported. In the ED hemodynamically stable, labs confirm abnormal LFTs, RUQ ultrasound-normal right upper quadrant, no gallstone or wall thickening, CBD 2.7 mm no focal lesion of the liver portal vein is patent Patient was admitted> and underwent CT abdomen pelvis with contrast> shows coarse granular appearance in both the liver and spleen parenchyma no hepatosplenomegaly no discrete liver lesion, trace fluid in the abdomen, new since 2016 rounded midline pelvic mass measuring up to 8.9 cm located on top of the urinary bladder favor large soft serosal fibroid exophytic from the ventral uterus.  Patient being managed conservatively multiple serology workup ordered mostly positive for immunity and IgG. LFT being trended daily and slowly improving.  Her nausea vomiting and discomfort is improving and scheduled Reglan and Pepcid    Subjective:  Seen and examined. She reports she vomited after drinking vegetable broth last night and only drinking water this morning as she is afraid  No other new complaints Labs reviewed AST ALT slightly better although to be elevated  Assessment and Plan: Principal Problem:    Jaundice Active Problems:   Essential hypertension   Transaminitis   Right upper quadrant pain   Hypokalemia   Allergic rhinitis   Acute hepatitis w/ Jaundice Nausea vomiting: No obvious etiology.GI following closely.  Workup so far acute viral hepatitis panel negative,imaging studies negative question DILI (?  Cephalexin, Augmentin).  PT/INR stable, multiple serologies ordered per GI so far  ANA positive, IgG 1908> possibly autoimmune hepatitis??-Workup still in process follow-up complete serology as ordered including HSV DNA/EBV/CMVAnti SmoothAb/ceruloplasmin level. GI is following closely appreciate input continue Pepcid, PPI, PRN Zofran, Reglan AC Continue to trend LFTs daily.  INR remains stable and reassuring.  Continue to avoid nephrotoxic medication Recent Labs  Lab 08/10/23 1610 08/10/23 2107 08/10/23 2204 08/11/23 0549 08/12/23 0516 08/12/23 0720 08/13/23 0517 08/14/23 0420  AST 1,191*  --   --  1,272*  --  1,127* 1,172* 1,117*  ALT 1,822*  --   --  1,763*  --  1,722* 1,646* 1,549*  ALKPHOS 231*  --   --  214*  --  226* 203* 190*  BILITOT 7.3*  --   --  7.7*  --  9.0* 9.0* 9.3*  BILIDIR  --   --   --  4.9*  --   --   --   --   PROT 7.9  --   --  7.1  --  7.1 6.6 6.6  ALBUMIN 3.9  --   --  3.5  --  3.7 3.2* 3.2*  AMMONIA  --   --  21  --   --   --   --   --   INR  --  0.9  --  1.0 1.0  --   --  1.0  LIPASE 21  --   --   --   --   --   --   --   PLT 291  --   --  274  --   --   --   --       Latest Ref Rng & Units 08/10/2023    9:07 PM  Hepatitis  Hep B Surface Ag NON REACTIVE NON REACTIVE   Hep B IgM NON REACTIVE NON REACTIVE   Hep C Ab NON REACTIVE NON REACTIVE   Hep A IgM NON REACTIVE NON REACTIVE    MELD 3.0: 18 at 08/14/2023  4:20 AM MELD-Na: 15 at 08/14/2023  4:20 AM Calculated from: Serum Creatinine: 0.60 mg/dL (Using min of 1 mg/dL) at 1/47/8295  6:21 AM Serum Sodium: 139 mmol/L (Using max of 137 mmol/L) at 08/14/2023  4:20 AM Total Bilirubin: 9.3 mg/dL  at 01/26/6577  4:69 AM Serum Albumin: 3.2 g/dL at 05/20/5283  1:32 AM INR(ratio): 1.0 at 08/14/2023  4:20 AM Age at listing (hypothetical): 42 years Sex: Female at 08/14/2023  4:20 AM  Hypokalemia: Repleting this morning  Hypertension: well controlled on amlodipine  Allergic rhinitis: Continue Flonase prn  Mixed continue tissue disorder: She is on Belimumab infusion weekly by Dr Dierdre Forth,  Constipation: Continue Senokot twice daily and MiraLAX as needed  Obesity w/ Body mass index is 32.2 kg/m. Will benefit with PCP follow-up, weight loss  healthy lifestyle and sleep apnea evaluation as outpatient   DVT prophylaxis: SCDs Start: 08/10/23 2229 Code Status:   Code Status: Full Code Family Communication: plan of care discussed with patient at bedside. Patient status is: Inpatient because of liver dysfunction Level of care: Med-Surg   Dispo: The patient is from: home            Anticipated disposition: TBD once okay with GI and LFTs stable/downtrending Objective: Vitals last 24 hrs: Vitals:   08/13/23 1300 08/13/23 1748 08/13/23 2040 08/14/23 0451  BP: (!) 157/102 (!) 150/92 (!) 157/97 128/80  Pulse: 67 75 81 72  Resp: 14 14 16 17   Temp: 98.9 F (37.2 C) 99.1 F (37.3 C) 98.5 F (36.9 C) 98.6 F (37 C)  TempSrc: Oral Oral Oral Oral  SpO2: 100% 99% 100% 99%  Weight:      Height:       Weight change:   Physical Examination: General exam: alert awake, oriented X 3 HEENT:Oral mucosa moist, Ear/Nose WNL grossly Respiratory system: Bilaterally clear BS,no use of accessory muscle Cardiovascular system: S1 & S2 +, No JVD. Gastrointestinal system: Abdomen soft,NT,ND, BS+ Nervous System: Alert, awake, moving all extremities,and following commands. Extremities: LE edema neg,distal peripheral pulses palpable and warm.  Skin: No rashes, +++ icterus. MSK: Normal muscle bulk,tone, power  Medications reviewed:  Scheduled Meds:  amLODipine  10 mg Oral Daily   famotidine  20 mg  Oral BID   fluticasone  1 spray Each Nare Daily   metoCLOPramide  5 mg Oral TID AC & HS   ondansetron (ZOFRAN) IV  4 mg Intravenous TID AC   pantoprazole  40 mg Oral Daily   senna-docusate  1 tablet Oral BID   Continuous Infusions:  lactated ringers 50 mL/hr at 08/13/23 2125    Diet Order             DIET SOFT Room service appropriate? Yes; Fluid consistency: Thin  Diet effective 1000  Intake/Output Summary (Last 24 hours) at 08/14/2023 1042 Last data filed at 08/14/2023 1000 Gross per 24 hour  Intake 2168.33 ml  Output 2300 ml  Net -131.67 ml   Net IO Since Admission: 3,433.37 mL [08/14/23 1042]  Wt Readings from Last 3 Encounters:  08/13/23 90.5 kg  02/07/19 91.2 kg  09/17/18 95.3 kg     Unresulted Labs (From admission, onward)     Start     Ordered   08/14/23 0902  Mitochondrial antibodies  Once,   R        08/14/23 0901   08/14/23 0500  Protime-INR  Daily,   R      08/13/23 0805   08/14/23 0500  Ceruloplasmin  Tomorrow morning,   R        08/13/23 1044   08/13/23 0500  Comprehensive metabolic panel  Daily,   R      08/12/23 0631   08/13/23 0500  HSV DNA by PCR (reference lab) Blood  Tomorrow morning,   R        08/12/23 1441   08/11/23 1522  Anti-smooth muscle antibody, IgG  Add-on,   AD        08/11/23 1522   08/11/23 1507  EBV ab to viral capsid ag pnl, IgG+IgM  Add-on,   AD        08/11/23 1506   08/11/23 1507  CMV IgM  Once,   R        08/11/23 1506          Data Reviewed: I have personally reviewed following labs and imaging studies CBC: Recent Labs  Lab 08/10/23 1610 08/11/23 0549  WBC 3.4* 3.3*  NEUTROABS  --  1.0*  HGB 12.4 12.2  HCT 38.9 38.0  MCV 79.4* 79.2*  PLT 291 274   Basic Metabolic Panel: Recent Labs  Lab 08/10/23 1610 08/10/23 2204 08/11/23 0549 08/12/23 0720 08/13/23 0517 08/14/23 0420  NA 137  --  136 138 138 139  K 3.1*  --  3.6 3.5 3.5 3.4*  CL 100  --  104 105 103 103  CO2 27  --  24 26 28 26    GLUCOSE 89  --  81 96 90 86  BUN 8  --  7 <5* <5* <5*  CREATININE 0.66  --  0.44 0.67 0.67 0.60  CALCIUM 9.2  --  8.5* 8.9 8.7* 8.9  MG  --  1.9 1.8  --   --   --    GFR: Estimated Creatinine Clearance: 101.7 mL/min (by C-G formula based on SCr of 0.6 mg/dL). Liver Function Tests: Recent Labs  Lab 08/10/23 1610 08/11/23 0549 08/12/23 0720 08/13/23 0517 08/14/23 0420  AST 1,191* 1,272* 1,127* 1,172* 1,117*  ALT 1,822* 1,763* 1,722* 1,646* 1,549*  ALKPHOS 231* 214* 226* 203* 190*  BILITOT 7.3* 7.7* 9.0* 9.0* 9.3*  PROT 7.9 7.1 7.1 6.6 6.6  ALBUMIN 3.9 3.5 3.7 3.2* 3.2*   Recent Labs  Lab 08/10/23 1610  LIPASE 21   Recent Labs  Lab 08/10/23 2204  AMMONIA 21  Coagulation Profile: Recent Labs  Lab 08/10/23 2107 08/11/23 0549 08/12/23 0516 08/14/23 0420  INR 0.9 1.0 1.0 1.0  No results found for this or any previous visit (from the past 240 hour(s)).  Antimicrobials: Anti-infectives (From admission, onward)    None      Culture/Microbiology NONE  Radiology Studies: No results found.   LOS: 4 days   Lanae Boast, MD [facility]  08/14/2023,  10:42 AM

## 2023-08-14 NOTE — Plan of Care (Signed)
  Problem: Education: Goal: Knowledge of General Education information will improve Description: Including pain rating scale, medication(s)/side effects and non-pharmacologic comfort measures Outcome: Progressing   Problem: Pain Managment: Goal: General experience of comfort will improve Outcome: Progressing   

## 2023-08-14 NOTE — Progress Notes (Signed)
   08/14/23 1013  TOC Brief Assessment  Insurance and Status Reviewed  Patient has primary care physician Yes  Home environment has been reviewed home with spouse  Prior level of function: independent  Prior/Current Home Services No current home services  Social Determinants of Health Reivew SDOH reviewed no interventions necessary  Readmission risk has been reviewed Yes  Transition of care needs no transition of care needs at this time   Of note, SDOH flagged due to "pt declined" - no true concerns.

## 2023-08-14 NOTE — Progress Notes (Addendum)
Progress Note  Primary GI: Michelle Rojas  LOS: 4 days   Chief Complaint: Abnormal liver tests   Subjective   Patient ambulating in room. States she has been doing fair. The Reglan before meals has helped with the nausea, however she states she has had few episodes where food comes back up in the last 24 hours. She states since her symptoms started she has had problems with solids feeling like they pass slowly. No history of GERD. She is tolerating full liquid diet and states today she is hungry for soft foods. No abdominal pain. She reports her last BM this morning which was clay colored. She also reports dark urine for the past week. No blood per rectum. Naproxen prn for menstrual cramping. Admits itchy skin.  No family was present at the time of my evaluation.   Objective   Imaging:  CT ABDOMEN AND PELVIS WITH CONTRAST  IMPRESSION: 1. Coarse granular appearance of both the liver and spleen parenchyma. But no associated hepatosplenomegaly. No discrete liver lesion. No stigmata of portal venous hypertension. And no gallbladder or biliary ductal dilatation. Etiology is unclear, but might reflect an intrinsic hepatocellular disease. Liver and spleen involvement by the underlying connective tissue disorder not excluded.   2. There is trace free fluid in the abdomen and pelvis with simple fluid density. No other acute or inflammatory process identified in the abdomen.   3. New since 2016 rounded midline pelvic mass measuring up to 8.9 cm diameter located on top of the urinary bladder. But favor this is a large subserosal fibroid exophytic from the ventral uterus. A smaller uterine fundal fibroid is also identified.  Vital signs in last 24 hours: Temp:  [98.5 F (36.9 C)-99.1 F (37.3 C)] 98.6 F (37 C) (09/23 0451) Pulse Rate:  [67-81] 72 (09/23 0451) Resp:  [14-17] 17 (09/23 0451) BP: (128-157)/(80-102) 128/80 (09/23 0451) SpO2:  [99 %-100 %] 99 % (09/23 0451) Last BM Date  : 08/13/23 Last BM recorded by nurses in past 5 days No data recorded  General:   female in no acute distress  Eyes: ureteric sclera Heart:  Regular rate and rhythm; no murmurs Pulm: Clear anteriorly; no wheezing Abdomen: soft, nondistended, normal bowel sounds in all quadrants. Nontender without guarding. No organomegaly appreciated. Extremities:  No edema Neurologic:  Alert and  oriented x4;  No focal deficits.  Psych:  Cooperative. Normal mood and affect. Skin: Dry and itchy  Intake/Output from previous day: 09/22 0701 - 09/23 0700 In: 3108.3 [P.O.:1320; I.V.:1788.3] Out: 2500 [Urine:2500] Intake/Output this shift: Total I/O In: -  Out: 100 [Urine:100]  Studies/Results: No results found.  Lab Results: No results for input(s): "WBC", "HGB", "HCT", "PLT" in the last 72 hours. BMET Recent Labs    08/12/23 0720 08/13/23 0517 08/14/23 0420  NA 138 138 139  K 3.5 3.5 3.4*  CL 105 103 103  CO2 26 28 26   GLUCOSE 96 90 86  BUN <5* <5* <5*  CREATININE 0.67 0.67 0.60  CALCIUM 8.9 8.7* 8.9   LFT Recent Labs    08/14/23 0420  PROT 6.6  ALBUMIN 3.2*  AST 1,117*  ALT 1,549*  ALKPHOS 190*  BILITOT 9.3*   PT/INR Recent Labs    08/12/23 0516 08/14/23 0420  LABPROT 13.3 13.3  INR 1.0 1.0     Scheduled Meds:  amLODipine  10 mg Oral Daily   famotidine  20 mg Oral BID   fluticasone  1 spray Each Nare Daily  metoCLOPramide  5 mg Oral TID AC & HS   senna-docusate  1 tablet Oral BID   Continuous Infusions:  lactated ringers 50 mL/hr at 08/13/23 2125     Patient Narrative:  42 y.o. year old female with a history of a mixed connective tissue disorder, HTN. She was admitted with several abnormal liver tests.   Impression/Plan:  Nausea / vomiting / generalized abdominal pain / abnormal liver chemistries in mixed pattern. Viral etiology? Drug induced liver injury ( has had a few rounds of Amoxicillin-clavulanate in last few months, last round in mid July (though  usually causes cholestatic pattern). Took Cephalexin for a UTI a few days ago. Autoimmune liver disease? Ischemic injury seems unlikely. CT AP suggests a coarse granular appearance of both the liver and spleen parenchyma ( ? Maybe connective tissue disease involvement). [place] ABD- No gallstones or wall thickening visualized.  Trending downward but still elevated LFT's; AST  1127>172>1117   ALT [phone]   Alk phosp 226>203>190  Total Bili 9.0> 9.3   Albumin 3.2 ANA positive      Elevated IgG 1908      Mono (-) Hepatitis panel negative -Continue metoclopramide 5 mg ac & hs and will start Pantoprazole 40mg  bid -Advance to soft diet for lunch -pending ASMA, EBV and CMV, mitochondrial AB, and Ceruloplasmin  -Avoid hepatotoxic medications  ADDENDUM: Received labs from Michelle Rojas office from 04/2018 which showed positive ANA and titer 1:640.   Pelvic mass on CT Scan - favored to be a large subserosal fibroid exophytic from the ventral uterus. Per patient has been seen by GYN.   Connective tissue disease, gets weekly infusions of Benlysta   Principal Problem:   Jaundice Active Problems:   Essential hypertension   Transaminitis   Right upper quadrant pain   Hypokalemia   Allergic rhinitis   Michelle Rojas  08/14/2023, 9:06 AM     [facility] Attending   I have taken an interval history, reviewed the chart and examined the patient. I agree with the Advanced Practitioner's note, impression and recommendations with the following additions:  I think autoimmune hepatitis is quite possible. Pruritus not common but possible with that. I think a liver bx is appropriate to help sort out and am considering empiric steroids.  We did get 2019 autoimmune eval and it showed abnl ANA and other serologies c/w lupus.  Have requested liver bx by IR.  Continue current care - lunch did stay down today.  Benadryl for pruritus  Michelle Boop, MD, [facility] See Loretha Stapler on call -  gastroenterology for best contact person 08/14/2023 4:05 PM

## 2023-08-14 NOTE — Plan of Care (Signed)
Problem: Education: Goal: Knowledge of General Education information will improve Description: Including pain rating scale, medication(s)/side effects and non-pharmacologic comfort measures Outcome: Progressing   Problem: Nutrition: Goal: Adequate nutrition will be maintained Outcome: Progressing

## 2023-08-15 ENCOUNTER — Inpatient Hospital Stay (HOSPITAL_COMMUNITY): Payer: 59

## 2023-08-15 DIAGNOSIS — B179 Acute viral hepatitis, unspecified: Secondary | ICD-10-CM | POA: Diagnosis not present

## 2023-08-15 DIAGNOSIS — R17 Unspecified jaundice: Secondary | ICD-10-CM | POA: Diagnosis not present

## 2023-08-15 LAB — COMPREHENSIVE METABOLIC PANEL
ALT: 1377 U/L — ABNORMAL HIGH (ref 0–44)
AST: 960 U/L — ABNORMAL HIGH (ref 15–41)
Albumin: 3 g/dL — ABNORMAL LOW (ref 3.5–5.0)
Alkaline Phosphatase: 164 U/L — ABNORMAL HIGH (ref 38–126)
Anion gap: 8 (ref 5–15)
BUN: <5 mg/dL — ABNORMAL LOW (ref 6–20)
CO2: 24 mmol/L (ref 22–32)
Calcium: 8.6 mg/dL — ABNORMAL LOW (ref 8.9–10.3)
Chloride: 105 mmol/L (ref 98–111)
Creatinine, Ser: 0.5 mg/dL (ref 0.44–1.00)
GFR, Estimated: >60 mL/min (ref >60)
Glucose, Bld: 84 mg/dL (ref 70–99)
Potassium: 3.5 mmol/L (ref 3.5–5.1)
Sodium: 137 mmol/L (ref 135–145)
Total Bilirubin: 9.5 mg/dL — ABNORMAL HIGH (ref 0.3–1.2)
Total Protein: 6.3 g/dL — ABNORMAL LOW (ref 6.5–8.1)

## 2023-08-15 LAB — PROTIME-INR
INR: 1 (ref 0.8–1.2)
Prothrombin Time: 13.8 seconds (ref 11.4–15.2)

## 2023-08-15 LAB — MITOCHONDRIAL ANTIBODIES: Mitochondrial M2 Ab, IgG: 54.2 Units — ABNORMAL HIGH (ref 0.0–20.0)

## 2023-08-15 LAB — CERULOPLASMIN: Ceruloplasmin: 36.1 mg/dL (ref 19.0–39.0)

## 2023-08-15 LAB — CMV IGM: CMV IgM: <30 AU/mL (ref 0.0–29.9)

## 2023-08-15 MED ORDER — LIDOCAINE HCL 1 % IJ SOLN
INTRAMUSCULAR | Status: AC
  Filled 2023-08-15: qty 10

## 2023-08-15 MED ORDER — GELATIN ABSORBABLE 12-7 MM EX MISC
CUTANEOUS | Status: AC
  Filled 2023-08-15: qty 1

## 2023-08-15 NOTE — Plan of Care (Signed)
Problem: Education: Goal: Knowledge of General Education information will improve Description: Including pain rating scale, medication(s)/side effects and non-pharmacologic comfort measures Outcome: Progressing   Problem: Health Behavior/Discharge Planning: Goal: Ability to manage health-related needs will improve Outcome: Progressing   Problem: Activity: Goal: Risk for activity intolerance will decrease Outcome: Progressing

## 2023-08-15 NOTE — Progress Notes (Signed)
PROGRESS NOTE Michelle Rojas  FAO:[id number] DOB: November 23, 1980 DOA: 08/10/2023 PCP: [facility]  Brief Narrative/Hospital Course: 16 old female with history of mixed connective tissue disorder diagnosed 2015, anxiety, fibromyalgia, GERD, hypertension presented to the ED with abnormal lab with elevated LFTs.  Patient had been nauseous past week and developing itching, some vomiting, dark urine, seen at the urgent care, thought she was having UTI but symptoms did not improve despite antibiotics and persisted with nausea vomiting, seen by PCP on 9/19 labs obtained showed elevated LFTs and sent to the ED.Patient has occasional alcohol use but denies daily drinking, no new medication has been getting injectable medication for MCTD for years and no excessive use of Tylenol reported. In the ED hemodynamically stable, labs confirmed abnormal LFTs, RUQ ultrasound-normal right upper quadrant, no gallstone or wall thickening, CBD 2.7 mm no focal lesion of the liver portal vein is patent Patient was admitted CT abdomen pelvis with contrast> shows coarse granular appearance in both the liver and spleen parenchyma no hepatosplenomegaly no discrete liver lesion, trace fluid in the abdomen, new since 2016 rounded midline pelvic mass measuring up to 8.9 cm located on top of the urinary bladder favor large soft serosal fibroid exophytic from the ventral uterus. 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 Having nausea vomiting, pruritus and discomfort  and on Reglan and Pepcid/PPI 9/23 IR consulted for liver biopsy.     Subjective: Patient seen and examined this morning Resting comfortably husband at the bedside so far tolerating diet no nausea or vomiting Spoke with IR regarding biopsy today Past 24 hours patient has been afebrile, BP stable. Labs shows AST down to 960 and ALT 1377 TB up  9.5  Assessment and Plan: Principal Problem:   Jaundice Active Problems:   Essential hypertension   Transaminitis   Right upper quadrant pain   Hypokalemia   Allergic rhinitis   Acute hepatitis   Pruritus   Nausea and vomiting   Acute hepatitis w/ Jaundice Nausea vomiting: No obvious etiology.GI following closely.  RUQ ultrasound-normal right upper quadrant, no gallstone or wall thickening, CBD 2.7 mm no focal lesion of the liver portal vein is patent Patient was admitted CT abdomen pelvis with contrast> shows coarse granular appearance in both the liver and spleen parenchyma no hepatosplenomegaly no discrete liver lesion, trace fluid in the abdomen, new since 2016 rounded midline pelvic mass measuring up to 8.9 cm located on top of the urinary bladder favor large soft serosal fibroid exophytic from the ventral uterus. 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 9/23 IR consulted for liver biopsy. Having nausea vomiting, pruritus and discomfort  and on Reglan and Pepcid/PPI Continue to trend LFTs daily.  INR remains stable and reassuring.  Continue to avoid hepatotoxic meds.  AST ALT downtrending. Recent Labs  Lab 08/10/23 1610 08/10/23 2107 08/10/23 2204 08/11/23 0549 08/12/23 0516 08/12/23 0720 08/13/23 0517 08/14/23 0420 08/15/23 0441  AST 1,191*  --   --  1,272*  --  1,127* 1,172* 1,117* 960*  ALT 1,822*  --   --  1,763*  --  1,722* 1,646* 1,549* 1,377*  ALKPHOS 231*  --   --  214*  --  226* 203* 190* 164*  BILITOT 7.3*  --   --  7.7*  --  9.0* 9.0* 9.3* 9.5*  BILIDIR  --   --   --  4.9*  --   --   --   --   --   PROT 7.9  --   --  7.1  --  7.1 6.6 6.6 6.3*  ALBUMIN 3.9  --   --  3.5  --  3.7 3.2* 3.2* 3.0*  AMMONIA  --   --  21  --   --   --   --   --   --   INR  --  0.9  --  1.0 1.0  --    --  1.0 1.0  LIPASE 21  --   --   --   --   --   --   --   --   PLT 291  --   --  274  --   --   --   --   --       Latest Ref Rng & Units 08/10/2023    9:07 PM  Hepatitis  Hep B Surface Ag NON REACTIVE NON REACTIVE   Hep B IgM NON REACTIVE NON REACTIVE   Hep C Ab NON REACTIVE NON REACTIVE   Hep A IgM NON REACTIVE NON REACTIVE    MELD 3.0: 19 at 08/15/2023  4:41 AM MELD-Na: 15 at 08/15/2023  4:41 AM Calculated from: Serum Creatinine: 0.50 mg/dL (Using min of 1 mg/dL) at 8/41/3244  0:10 AM Serum Sodium: 137 mmol/L at 08/15/2023  4:41 AM Total Bilirubin: 9.5 mg/dL at 2/72/5366  4:40 AM Serum Albumin: 3.0 g/dL at 3/47/4259  5:63 AM INR(ratio): 1.0 at 08/15/2023  4:41 AM Age at listing (hypothetical): 42 years Sex: Female at 08/15/2023  4:41 AM  Hypokalemia: Resolved  Hypertension: BP well controlled on amlodipine  Allergic rhinitis: Continue Flonase prn  Mixed continue tissue disorder: She is on Belimumab infusion weekly by Dr Dierdre Forth. On hold while inpatient  Constipation: Continue Senokot twice daily and MiraLAX as needed  Obesity w/ Body mass index is 32.2 kg/m. Will benefit with PCP follow-up, weight loss  healthy lifestyle and sleep apnea evaluation as outpatient   DVT prophylaxis: SCDs Start: 08/10/23 2229 Code Status:   Code Status: Full Code Family Communication: plan of care discussed with patient at bedside. Patient status is: Inpatient because of liver dysfunction Level of care: Med-Surg   Dispo: The patient is from: home            Anticipated disposition: TBD once okay with GI and LFTs stable/downtrending Objective: Vitals last 24 hrs: Vitals:   08/14/23 0451 08/14/23 1306 08/14/23 2039 08/15/23 0504  BP: 128/80 (!) 142/98 (!) 145/95 128/84  Pulse: 72 80 74 71  Resp: 17 14 17 17   Temp: 98.6 F (37 C) 98.8 F (37.1 C) 98.3 F (36.8 C) 98.3 F (36.8 C)  TempSrc: Oral Oral Oral Oral  SpO2: 99% 100% 96% 100%  Weight:      Height:       Weight  change:   Physical Examination: General exam: alert awake, oriented x3 HEENT:Oral mucosa moist, Ear/Nose WNL grossly Respiratory system: Bilaterally clear BS,no use of accessory muscle Cardiovascular system: S1 & S2 +, No JVD. Gastrointestinal system: Abdomen soft,NT,ND, BS+ Nervous System: Alert, awake, moving all extremities,and following commands. Extremities: LE edema neg,distal peripheral pulses palpable and warm.  Skin: No rashes,icterus +++. MSK: Normal muscle bulk,tone, power   Medications reviewed:  Scheduled Meds:  amLODipine  10 mg Oral Daily  famotidine  20 mg Oral BID   fluticasone  1 spray Each Nare Daily   metoCLOPramide  5 mg Oral TID AC & HS   pantoprazole  40 mg Oral Daily   senna-docusate  1 tablet Oral BID   Continuous Infusions:  lactated ringers 50 mL/hr at 08/15/23 0556    Diet Order             Diet NPO time specified Except for: Sips with Meds  Diet effective midnight                  Intake/Output Summary (Last 24 hours) at 08/15/2023 1155 Last data filed at 08/15/2023 1000 Gross per 24 hour  Intake 1900 ml  Output 1650 ml  Net 250 ml   Net IO Since Admission: 3,883.37 mL [08/15/23 1155]  Wt Readings from Last 3 Encounters:  08/13/23 90.5 kg  02/07/19 91.2 kg  09/17/18 95.3 kg     Unresulted Labs (From admission, onward)     Start     Ordered   08/16/23 0500  CBC with Differential/Platelet  Tomorrow morning,   R        08/15/23 0912   08/14/23 0902  Mitochondrial antibodies  Once,   R        08/14/23 0901   08/14/23 0500  Protime-INR  Daily,   R      08/13/23 0805   08/14/23 0500  Ceruloplasmin  Tomorrow morning,   R        08/13/23 1044   08/13/23 0500  Comprehensive metabolic panel  Daily,   R      08/12/23 0631   08/13/23 0500  HSV DNA by PCR (reference lab) Blood  Tomorrow morning,   R        08/12/23 1441   08/11/23 1522  Anti-smooth muscle antibody, IgG  Add-on,   AD        08/11/23 1522   08/11/23 1507  EBV ab to viral  capsid ag pnl, IgG+IgM  Add-on,   AD        08/11/23 1506   08/11/23 1507  CMV IgM  Once,   R        08/11/23 1506          Data Reviewed: I have personally reviewed following labs and imaging studies CBC: Recent Labs  Lab 08/10/23 1610 08/11/23 0549  WBC 3.4* 3.3*  NEUTROABS  --  1.0*  HGB 12.4 12.2  HCT 38.9 38.0  MCV 79.4* 79.2*  PLT 291 274   Basic Metabolic Panel: Recent Labs  Lab 08/10/23 2204 08/11/23 0549 08/12/23 0720 08/13/23 0517 08/14/23 0420 08/15/23 0441  NA  --  136 138 138 139 137  K  --  3.6 3.5 3.5 3.4* 3.5  CL  --  104 105 103 103 105  CO2  --  24 26 28 26 24   GLUCOSE  --  81 96 90 86 84  BUN  --  7 <5* <5* <5* <5*  CREATININE  --  0.44 0.67 0.67 0.60 0.50  CALCIUM  --  8.5* 8.9 8.7* 8.9 8.6*  MG 1.9 1.8  --   --   --   --    GFR: Estimated Creatinine Clearance: 101.7 mL/min (by C-G formula based on SCr of 0.5 mg/dL). Liver Function Tests: Recent Labs  Lab 08/11/23 0549 08/12/23 0720 08/13/23 0517 08/14/23 0420 08/15/23 0441  AST 1,272* 1,127* 1,172* 1,117* 960*  ALT 1,763* 1,722* 1,646* 1,549*  1,377*  ALKPHOS 214* 226* 203* 190* 164*  BILITOT 7.7* 9.0* 9.0* 9.3* 9.5*  PROT 7.1 7.1 6.6 6.6 6.3*  ALBUMIN 3.5 3.7 3.2* 3.2* 3.0*   Recent Labs  Lab 08/10/23 1610  LIPASE 21   Recent Labs  Lab 08/10/23 2204  AMMONIA 21  Coagulation Profile: Recent Labs  Lab 08/10/23 2107 08/11/23 0549 08/12/23 0516 08/14/23 0420 08/15/23 0441  INR 0.9 1.0 1.0 1.0 1.0  No results found for this or any previous visit (from the past 240 hour(s)).  Antimicrobials: Anti-infectives (From admission, onward)    None     Culture/Microbiology NONE  Radiology Studies: No results found.   LOS: 5 days   Lanae Boast, MD [facility]  08/15/2023, 11:55 AM

## 2023-08-15 NOTE — Plan of Care (Signed)
  Problem: Education: Goal: Knowledge of General Education information will improve Description: Including pain rating scale, medication(s)/side effects and non-pharmacologic comfort measures Outcome: Progressing   Problem: Clinical Measurements: Goal: Ability to maintain clinical measurements within normal limits will improve Outcome: Progressing   

## 2023-08-15 NOTE — Progress Notes (Signed)
Patient ID: Michelle Rojas, female   DOB: 25-Jul-1981, 42 y.o.   MRN: [id number] Due to lengthy IR cases today patient's liver biopsy has been postponed until 9/25.  Nurse aware.

## 2023-08-15 NOTE — Progress Notes (Addendum)
Progress Note  Primary GI: Dr. Russella Dar  LOS: 5 days   Chief Complaint: Abnormal liver  tests   Subjective   Patient walking around room, states no events overnight. She reports her nausea has improved with scheduled antiemetics and PPI therapy BID. No abdominal pain, fever or chills. She does continue itching, has not requested prn Benadryl. Reports urine is still dark. Has not had BM today. No new issues or concerns.  No family was present at the time of my evaluation.   Objective   Vital signs in last 24 hours: Temp:  [98.3 F (36.8 C)-98.8 F (37.1 C)] 98.3 F (36.8 C) (09/24 0504) Pulse Rate:  [71-80] 71 (09/24 0504) Resp:  [14-17] 17 (09/24 0504) BP: (128-145)/(84-98) 128/84 (09/24 0504) SpO2:  [96 %-100 %] 100 % (09/24 0504) Last BM Date : 08/14/23 Last BM recorded by nurses in past 5 days Stool Type: Type 2 (Lump and sausage like) (08/14/2023 10:00 AM)  General:   female in no acute distress  Eyes: ureteric sclera  Heart:  Regular rate and rhythm; no murmurs Pulm: Clear anteriorly; no wheezing Abdomen: soft, nondistended, normal bowel sounds in all quadrants. Nontender without guarding. No organomegaly appreciated. Extremities:  No edema Neurologic:  Alert and  oriented x4;  No focal deficits.  Psych:  Cooperative. Normal mood and affect.  Intake/Output from previous day: 09/23 0701 - 09/24 0700 In: 2220 [P.O.:1020; I.V.:1200] Out: 1650 [Urine:1650] Intake/Output this shift: Total I/O In: -  Out: 200 [Urine:200]  Studies/Results: No results found.  Lab Results: No results for input(s): "WBC", "HGB", "HCT", "PLT" in the last 72 hours. BMET Recent Labs    08/13/23 0517 08/14/23 0420 08/15/23 0441  NA 138 139 137  K 3.5 3.4* 3.5  CL 103 103 105  CO2 28 26 24   GLUCOSE 90 86 84  BUN <5* <5* <5*  CREATININE 0.67 0.60 0.50  CALCIUM 8.7* 8.9 8.6*   LFT Recent Labs    08/15/23 0441  PROT 6.3*  ALBUMIN 3.0*  AST 960*  ALT 1,377*  ALKPHOS 164*   BILITOT 9.5*   PT/INR Recent Labs    08/14/23 0420 08/15/23 0441  LABPROT 13.3 13.8  INR 1.0 1.0     Scheduled Meds:  amLODipine  10 mg Oral Daily   famotidine  20 mg Oral BID   fluticasone  1 spray Each Nare Daily   metoCLOPramide  5 mg Oral TID AC & HS   pantoprazole  40 mg Oral Daily   senna-docusate  1 tablet Oral BID   Continuous Infusions:  lactated ringers 50 mL/hr at 08/15/23 0556     Patient Narrative:  42 y.o. year old female with a history of a mixed connective tissue disorder, HTN. She was admitted with several abnormal liver tests.    Impression/Plan:  Nausea / vomiting / generalized abdominal pain / abnormal liver chemistries in mixed pattern. Viral etiology? Drug induced liver injury ( has had a few rounds of Amoxicillin-clavulanate in last few months, last round in mid July (though usually causes cholestatic pattern). Took Cephalexin for a UTI a few days ago. Autoimmune liver disease? Ischemic injury seems unlikely. CT AP suggests a coarse granular appearance of both the liver and spleen parenchyma ( ? Maybe connective tissue disease involvement). [place] ABD- No gallstones or wall thickening visualized. We did get 2019 autoimmune eval and it showed abnl ANA and other serologies c/w lupus. Mono and Hepatitis panel negative. LFT's trending downward slowly: AST  1127>1172>1117>960  ALT 1722>1646>1549>1377   Alk phosp 226>203>190 >164   Total Bili 9.0> 9.3>9.5  ANA positive      Elevated IgG 1908       -Continue to monitor LFT's -Liver biopsy today with IR to r/o autoimmune, pending results may start steroids -Continue metoclopramide 5 mg ac & hs and Pantoprazole 40mg  bid -Benadryl prn for itching -Advance to soft diet this afternoon -pending ASMA, EBV and CMV, mitochondrial AB, and Ceruloplasmin  -Avoid hepatotoxic medications   Pelvic mass on CT Scan - favored to be a large subserosal fibroid exophytic from the ventral uterus. Per patient has been seen by GYN.    Connective tissue disease, gets weekly infusions of Benlysta   Principal Problem:   Jaundice Active Problems:   Essential hypertension   Transaminitis   Right upper quadrant pain   Hypokalemia   Allergic rhinitis   Acute hepatitis   Pruritus   Nausea and vomiting   Deanna J May  08/15/2023, 8:43 AM    [facility] Attending   I have taken an interval history, reviewed the chart and examined the patient. I agree with the Advanced Practitioner's note, impression and recommendations with the following additions:  She is overall better, transaminases better also. Mitochondrial Abs are elevated. I do not think this pattern of signs and sxs fits PBC, though. CMV IgM negative.  Liver bx tomorrow (unable to do today)  If she is tol diet ok can go after liver bx  Still pondering steroids  Iva Boop, MD, [facility] See Loretha Stapler on call - gastroenterology for best contact person 08/15/2023 4:34 PM

## 2023-08-15 NOTE — Consult Note (Cosign Needed Addendum)
Chief Complaint: Patient was seen in consultation today for image guided random core liver biopsy  Chief Complaint  Patient presents with   Abnormal Lab    Referring Physician(s): Sharia Reeve  Supervising Physician: Roanna Banning  Patient Status: [facility] - In-pt  History of Present Illness: Michelle Rojas is a 42 y.o. female with past medical history of anxiety, fibromyalgia, GERD, hypertension, mixed connective tissue disease recently presented to [facility] ED on 9/19 with elevated LFTs, nausea, occasional vomiting, jaundice, itching, dark urine. She has occasional alcohol use. She has known history of positive ANA and elevated IgG, ?lupus.  Right upper quadrant abdominal ultrasound on 08/10/23 was normal.  CT abdomen pelvis revealed: 1. Coarse granular appearance of both the liver and spleen parenchyma. But no associated hepatosplenomegaly. No discrete liver lesion. No stigmata of portal venous hypertension. And no gallbladder or biliary ductal dilatation. Etiology is unclear, but might reflect an intrinsic hepatocellular disease. Liver and spleen involvement by the underlying connective tissue disorder not excluded.   2. There is trace free fluid in the abdomen and pelvis with simple fluid density. No other acute or inflammatory process identified in the abdomen.   3. New since 2016 rounded midline pelvic mass measuring up to 8.9 cm diameter located on top of the urinary bladder. But favor this is a large subserosal fibroid exophytic from the ventral uterus. A smaller uterine fundal fibroid is also identified.   Patient is afebrile, WBC 3.3, hemoglobin normal, platelets normal, PT/INR normal, creatinine normal, AST 960, ALT 1377, alk phos 164, total bilirubin 9.5; negative acute hepatitis panel; request now received from GI team for image guided random core liver biopsy to rule out autoimmune hepatitis  Past Medical History:  Diagnosis Date   Anxiety    PP treated with  counseling no meds   Fibromyalgia    GERD (gastroesophageal reflux disease)    History of palpitations    negative work-up by cardiologist-- dr Eden Emms-- 2015   Hypertension    taking labetalol   MCTD (mixed connective tissue disease) (HCC)    dx 07/ 2015--  rheumotologist--  dr Zenovia Jordan   Nausea    Numbness and tingling    Wears contact lenses     Past Surgical History:  Procedure Laterality Date   DILATION AND EVACUATION N/A 06/17/2016   Procedure: DILATATION AND EVACUATION;  Surgeon: [name], DO;  Location: [facility];  Service: Gynecology;  Laterality: N/A;  WTIH SUCTION   WISDOM TOOTH EXTRACTION  2012    Allergies: Patient has no known allergies.  Medications: Prior to Admission medications   Medication Sig Start Date End Date Taking? Authorizing Provider  amLODipine (NORVASC) 10 MG tablet Take 10 mg by mouth daily. 08/08/23  Yes [provider]  fluticasone (FLONASE) 50 MCG/ACT nasal spray Place 1 spray into both nostrils daily. 05/30/23  Yes [provider]  naproxen (NAPROSYN) 500 MG tablet Take 500 mg by mouth as needed (Pain).   Yes [provider]  ondansetron (ZOFRAN) 4 MG tablet Take 4 mg by mouth every 8 (eight) hours as needed. 08/06/23  Yes [provider]  ondansetron (ZOFRAN-ODT) 4 MG disintegrating tablet Take 4 mg by mouth every 8 (eight) hours as needed for nausea (For up to 7 days). 08/08/23 08/15/23 Yes [provider]  traMADol (ULTRAM) 50 MG tablet Take 1-2 tablets (50-100 mg total) by mouth daily as needed. 11/06/18  Yes Tarry Kos, MD  valACYclovir (VALTREX) 500 MG tablet Take  500 mg by mouth as needed (fever blisters).   Yes [provider]  Vitamin D, Ergocalciferol, (DRISDOL) 1.25 MG (50000 UNIT) CAPS capsule Take 50,000 Units by mouth every 7 (seven) days. 03/01/21  Yes [provider]  BENLYSTA 200 MG/ML SOAJ once a week. 02/03/19   [provider]  buPROPion  (WELLBUTRIN XL) 150 MG 24 hr tablet Take 150 mg by mouth in the morning. Patient not taking: Reported on 08/10/2023 09/12/22   [provider]  diclofenac sodium (VOLTAREN) 1 % GEL Apply 4 g topically 4 (four) times daily. Patient not taking: Reported on 08/10/2023 09/17/18   Levert Feinstein, MD  ibuprofen (ADVIL,MOTRIN) 600 MG tablet Take 1 tablet (600 mg total) every 6 (six) hours by mouth. Patient taking differently: Take 600 mg by mouth as needed. 09/25/17   Candice Camp, MD  INCASSIA 0.35 MG tablet Take 1 tablet by mouth daily. Patient not taking: Reported on 08/10/2023 08/09/23   [provider]  meclizine (ANTIVERT) 25 MG tablet Take 1 tablet (25 mg total) by mouth 3 (three) times daily as needed for dizziness. 12/13/21   Tanda Rockers, PA-C  phenazopyridine (PYRIDIUM) 200 MG tablet Take 200 mg by mouth 3 (three) times daily as needed. Patient not taking: Reported on 08/10/2023 08/06/23   [provider]  telmisartan-hydrochlorothiazide (MICARDIS HCT) 40-12.5 MG tablet Take 1 tablet by mouth daily. 12/13/21 01/12/22  Tanda Rockers, PA-C     Family History  Problem Relation Age of Onset   Hypertension Mother    Hypertension Father    Hypertension Maternal Grandmother    Healthy Brother    Healthy Brother    Healthy Daughter    Healthy Daughter     Social History   Socioeconomic History   Marital status: Married    Spouse name: Not on file   Number of children: 2   Years of education: some college   Highest education level: Not on file  Occupational History   Occupation: Home maker  Tobacco Use   Smoking status: Never   Smokeless tobacco: Never  Vaping Use   Vaping status: Never Used  Substance and Sexual Activity   Alcohol use: Yes    Alcohol/week: 1.0 standard drink of alcohol    Types: 1 Glasses of wine per week    Comment: social settings   Drug use: No   Sexual activity: Yes  Other Topics Concern   Not on file  Social History Narrative    Lives at home with husband and children.   Right-handed.   Caffeine use:  5 cups per day.   Social Determinants of Health   Financial Resource Strain: High Risk (08/08/2023)   Received from [facility]   Overall Financial Resource Strain (CARDIA)    Difficulty of Paying Living Expenses: Hard  Food Insecurity: Patient Declined (08/11/2023)   Hunger Vital Sign    Worried About Running Out of Food in the Last Year: Patient declined    Ran Out of Food in the Last Year: Patient declined  Recent Concern: Food Insecurity - Food Insecurity Present (08/08/2023)   Received from [facility]   Hunger Vital Sign    Worried About Running Out of Food in the Last Year: Sometimes true    Ran Out of Food in the Last Year: Sometimes true  Transportation Needs: Patient Declined (08/11/2023)   PRAPARE - Administrator, Civil Service (Medical): Patient declined    Lack of Transportation (Non-Medical): Patient declined  Physical  Activity: Insufficiently Active (08/08/2023)   Received from [facility]   Exercise Vital Sign    Days of Exercise per Week: 1 day    Minutes of Exercise per Session: 20 min  Stress: Stress Concern Present (08/08/2023)   Received from [facility] - Occupational Stress Questionnaire    Feeling of Stress : Very much  Social Connections: Socially Integrated (08/08/2023)   Received from [facility]   Social Network    How would you rate your social network (family, work, friends)?: Good participation with social networks      Review of Systems currently denies fever, headache, chest pain, dyspnea, cough, abdominal/back pain, nausea, vomiting or bleeding  Vital Signs: BP 128/84 (BP Location: Right Arm)   Pulse 71   Temp 98.3 F (36.8 C) (Oral)   Resp 17   Ht 5\' 6"  (1.676 m)   Wt 199 lb 8.3 oz (90.5 kg)   SpO2 100%   BMI 32.20 kg/m     Physical Exam awake, alert.  Scleral icterus.  Chest clear to auscultation  bilaterally.  Heart with regular rate and rhythm.  Abdomen soft, positive bowel sounds, nontender.  No lower extremity edema.  Imaging: CT ABDOMEN PELVIS W CONTRAST  Result Date: 08/11/2023 CLINICAL DATA:  42 year old female with dark urine, jaundice. Abnormal LFTs. On antibiotics for UTI. History of a mixed connective tissue disorder. Right upper quadrant pain. Abdomen and flank pain. EXAM: CT ABDOMEN AND PELVIS WITH CONTRAST TECHNIQUE: Multidetector CT imaging of the abdomen and pelvis was performed using the standard protocol following bolus administration of intravenous contrast. RADIATION DOSE REDUCTION: This exam was performed according to the departmental dose-optimization program which includes automated exposure control, adjustment of the mA and/or kV according to patient size and/or use of iterative reconstruction technique. CONTRAST:  OMNIPAQUE IOHEXOL 300 MG/ML  SOLN COMPARISON:  Abdomen ultrasound yesterday. CT Abdomen and Pelvis 02/09/2015. FINDINGS: Lower chest: No cardiomegaly, pericardial or pleural effusion. Lung bases appear negative except for mild lower lobe curvilinear scarring or atelectasis. Hepatobiliary: Coarse granular appearance of the liver parenchyma (series 2, image 6) but no hepatomegaly or discrete liver lesion. Hepatic veins and portal veins appear to be patent and appear fairly unremarkable. The gallbladder is partially contracted and unremarkable. No biliary ductal dilatation. No perihepatic free fluid. Pancreas: Negative.  No pancreatic ductal dilatation or atrophy. Spleen: Diminutive spleen although with a similar course enhancement pattern as that seen in the liver (series 2, image 4). No perisplenic fluid. Adrenals/Urinary Tract: Negative adrenal glands and nonobstructed kidneys. No delayed renal images. Small right renal mid pole benign-appearing cyst (no follow-up imaging recommended). Diminutive bladder. Occasional pelvic phleboliths. Stomach/Bowel: No dilated  bowel loops. Hyperdense retained stool in the large bowel. There is trace free fluid in the right lower quadrant adjacent to the lateral cecum on series 8, image 54. The appendix arising medial from the cecum appears normal on coronal image 59. No convincing large bowel inflammation. Decompressed stomach and duodenum. No free air. Vascular/Lymphatic: Portal venous system appears to be patent and nondilated. Major arterial structures in the abdomen and pelvis appear patent and normal. No calcified atherosclerosis or lymphadenopathy identified. Reproductive: There is a round, lobulated, heterogeneously hyperdense soft tissue mass in the anterior pelvis overlying the urinary bladder which is new from 2016, although appears inseparable from and likely exophytic from the ventral uterus on sagittal image 96. The exophytic mass is 75 x 89 x 72 mm (AP by transverse by  CC) mild regional mass effect. No regional inflammation. Evidence of a small additional left uterine fundal fibroid on sagittal image 94. Uterus and adnexa otherwise appear negative. Other: No small volume of simple fluid density free fluid in the pelvis, primarily the cul-de-sac on series 2, image 62. Musculoskeletal: No acute or suspicious osseous lesion identified. IMPRESSION: 1. Coarse granular appearance of both the liver and spleen parenchyma. But no associated hepatosplenomegaly. No discrete liver lesion. No stigmata of portal venous hypertension. And no gallbladder or biliary ductal dilatation. Etiology is unclear, but might reflect an intrinsic hepatocellular disease. Liver and spleen involvement by the underlying connective tissue disorder not excluded. 2. There is trace free fluid in the abdomen and pelvis with simple fluid density. No other acute or inflammatory process identified in the abdomen. 3. New since 2016 rounded midline pelvic mass measuring up to 8.9 cm diameter located on top of the urinary bladder. But favor this is a large subserosal  fibroid exophytic from the ventral uterus. A smaller uterine fundal fibroid is also identified. Electronically Signed   By: Odessa Fleming M.D.   On: 08/11/2023 07:20   US Abdomen Limited RUQ (LIVER/GB)  Result Date: 08/10/2023 CLINICAL DATA:  Vomiting EXAM: ULTRASOUND ABDOMEN LIMITED RIGHT UPPER QUADRANT COMPARISON:  Abdominal ultrasound 05/10/2017 FINDINGS: Gallbladder: No gallstones or wall thickening visualized. No sonographic Murphy sign noted by sonographer. Common bile duct: Diameter: 2.7 mm Liver: No focal lesion identified. Within normal limits in parenchymal echogenicity. Portal vein is patent on color Doppler imaging with normal direction of blood flow towards the liver. Other: None. IMPRESSION: Normal right upper quadrant ultrasound. Electronically Signed   By: Darliss Cheney M.D.   On: 08/10/2023 22:14    Labs:  CBC: Recent Labs    08/10/23 1610 08/11/23 0549  WBC 3.4* 3.3*  HGB 12.4 12.2  HCT 38.9 38.0  PLT 291 274    COAGS: Recent Labs    08/11/23 0549 08/12/23 0516 08/14/23 0420 08/15/23 0441  INR 1.0 1.0 1.0 1.0    BMP: Recent Labs    08/12/23 0720 08/13/23 0517 08/14/23 0420 08/15/23 0441  NA 138 138 139 137  K 3.5 3.5 3.4* 3.5  CL 105 103 103 105  CO2 26 28 26 24   GLUCOSE 96 90 86 84  BUN <5* <5* <5* <5*  CALCIUM 8.9 8.7* 8.9 8.6*  CREATININE 0.67 0.67 0.60 0.50  GFRNONAA >60 >60 >60 >60    LIVER FUNCTION TESTS: Recent Labs    08/12/23 0720 08/13/23 0517 08/14/23 0420 08/15/23 0441  BILITOT 9.0* 9.0* 9.3* 9.5*  AST 1,127* 1,172* 1,117* 960*  ALT 1,722* 1,646* 1,549* 1,377*  ALKPHOS 226* 203* 190* 164*  PROT 7.1 6.6 6.6 6.3*  ALBUMIN 3.7 3.2* 3.2* 3.0*    TUMOR MARKERS: No results for input(s): "AFPTM", "CEA", "CA199", "CHROMGRNA" in the last 8760 hours.  Assessment and Plan: 42 y.o. female with past medical history of anxiety, fibromyalgia, GERD, hypertension, mixed connective tissue disease recently presented to [facility] ED on 9/19 with  elevated LFTs, nausea, occasional vomiting, jaundice, itching, dark urine. She has occasional alcohol use. She has known history of positive ANA and elevated IgG, ?lupus.  Right upper quadrant abdominal ultrasound on 08/10/23 was normal.  CT abdomen pelvis revealed: 1. Coarse granular appearance of both the liver and spleen parenchyma. But no associated hepatosplenomegaly. No discrete liver lesion. No stigmata of portal venous hypertension. And no gallbladder or biliary ductal dilatation. Etiology is unclear, but might reflect an intrinsic hepatocellular  disease. Liver and spleen involvement by the underlying connective tissue disorder not excluded.   2. There is trace free fluid in the abdomen and pelvis with simple fluid density. No other acute or inflammatory process identified in the abdomen.   3. New since 2016 rounded midline pelvic mass measuring up to 8.9 cm diameter located on top of the urinary bladder. But favor this is a large subserosal fibroid exophytic from the ventral uterus. A smaller uterine fundal fibroid is also identified.   Patient is afebrile, WBC 3.3, hemoglobin normal, platelets normal, PT/INR normal, creatinine normal, AST 960, ALT 1377, alk phos 164, total bilirubin 9.5; negative acute hepatitis panel; request now received from GI team for image guided random core liver biopsy to rule out autoimmune hepatitis.Risks and benefits of procedure was discussed with the patient including, but not limited to bleeding, infection, damage to adjacent structures or low yield requiring additional tests.  All of the questions were answered and there is agreement to proceed.  Consent signed and in chart.  Procedure scheduled for today.    Thank you for this interesting consult.  I greatly enjoyed meeting Lakima A Fury and look forward to participating in their care.  A copy of this report was sent to the requesting provider on this date.  Electronically Signed: D. Jeananne Rama, PA-C 08/15/2023, 8:58 AM   I spent a total of   25 minutes  in face to face in clinical consultation, greater than 50% of which was counseling/coordinating care for image guided random core liver biopsy

## 2023-08-16 ENCOUNTER — Inpatient Hospital Stay (HOSPITAL_COMMUNITY): Payer: 59

## 2023-08-16 ENCOUNTER — Other Ambulatory Visit: Payer: Self-pay | Admitting: Internal Medicine

## 2023-08-16 DIAGNOSIS — B179 Acute viral hepatitis, unspecified: Secondary | ICD-10-CM | POA: Diagnosis not present

## 2023-08-16 DIAGNOSIS — D509 Iron deficiency anemia, unspecified: Secondary | ICD-10-CM

## 2023-08-16 DIAGNOSIS — J301 Allergic rhinitis due to pollen: Secondary | ICD-10-CM | POA: Diagnosis not present

## 2023-08-16 DIAGNOSIS — R17 Unspecified jaundice: Secondary | ICD-10-CM | POA: Diagnosis not present

## 2023-08-16 DIAGNOSIS — I10 Essential (primary) hypertension: Secondary | ICD-10-CM | POA: Diagnosis not present

## 2023-08-16 LAB — EBV AB TO VIRAL CAPSID AG PNL, IGG+IGM
EBV VCA IgG: >600 U/mL — ABNORMAL HIGH (ref 0.0–17.9)
EBV VCA IgM: <36 U/mL (ref 0.0–35.9)

## 2023-08-16 LAB — COMPREHENSIVE METABOLIC PANEL
ALT: 1403 U/L — ABNORMAL HIGH (ref 0–44)
AST: 1151 U/L — ABNORMAL HIGH (ref 15–41)
Albumin: 2.9 g/dL — ABNORMAL LOW (ref 3.5–5.0)
Alkaline Phosphatase: 174 U/L — ABNORMAL HIGH (ref 38–126)
Anion gap: 7 (ref 5–15)
BUN: 5 mg/dL — ABNORMAL LOW (ref 6–20)
CO2: 26 mmol/L (ref 22–32)
Calcium: 8.4 mg/dL — ABNORMAL LOW (ref 8.9–10.3)
Chloride: 104 mmol/L (ref 98–111)
Creatinine, Ser: 0.73 mg/dL (ref 0.44–1.00)
GFR, Estimated: >60 mL/min (ref >60)
Glucose, Bld: 80 mg/dL (ref 70–99)
Potassium: 3.6 mmol/L (ref 3.5–5.1)
Sodium: 137 mmol/L (ref 135–145)
Total Bilirubin: 10.7 mg/dL — ABNORMAL HIGH (ref 0.3–1.2)
Total Protein: 6.2 g/dL — ABNORMAL LOW (ref 6.5–8.1)

## 2023-08-16 LAB — CBC WITH DIFFERENTIAL/PLATELET
Abs Immature Granulocytes: 0.01 10*3/uL (ref 0.00–0.07)
Basophils Absolute: 0 10*3/uL (ref 0.0–0.1)
Basophils Relative: 1 %
Eosinophils Absolute: 0.1 10*3/uL (ref 0.0–0.5)
Eosinophils Relative: 2 %
HCT: 32.4 % — ABNORMAL LOW (ref 36.0–46.0)
Hemoglobin: 10.7 g/dL — ABNORMAL LOW (ref 12.0–15.0)
Immature Granulocytes: 0 %
Lymphocytes Relative: 47 %
Lymphs Abs: 1.6 10*3/uL (ref 0.7–4.0)
MCH: 24.9 pg — ABNORMAL LOW (ref 26.0–34.0)
MCHC: 33 g/dL (ref 30.0–36.0)
MCV: 75.5 fL — ABNORMAL LOW (ref 80.0–100.0)
Monocytes Absolute: 0.9 10*3/uL (ref 0.1–1.0)
Monocytes Relative: 26 %
Neutro Abs: 0.8 10*3/uL — ABNORMAL LOW (ref 1.7–7.7)
Neutrophils Relative %: 24 %
Platelets: 294 10*3/uL (ref 150–400)
RBC: 4.29 MIL/uL (ref 3.87–5.11)
RDW: 16.4 % — ABNORMAL HIGH (ref 11.5–15.5)
WBC: 3.4 10*3/uL — ABNORMAL LOW (ref 4.0–10.5)
nRBC: 0 % (ref 0.0–0.2)

## 2023-08-16 LAB — ANTI-SMOOTH MUSCLE ANTIBODY, IGG: F-Actin IgG: 80 Units — ABNORMAL HIGH (ref 0–19)

## 2023-08-16 LAB — PROTIME-INR
INR: 1 (ref 0.8–1.2)
Prothrombin Time: 13.8 seconds (ref 11.4–15.2)

## 2023-08-16 MED ORDER — FENTANYL CITRATE (PF) 100 MCG/2ML IJ SOLN
INTRAMUSCULAR | Status: AC | PRN
Start: 2023-08-16 — End: 2023-08-16
  Administered 2023-08-16 (×2): 50 ug via INTRAVENOUS

## 2023-08-16 MED ORDER — GELATIN ABSORBABLE 12-7 MM EX MISC
CUTANEOUS | Status: AC
  Filled 2023-08-16: qty 1

## 2023-08-16 MED ORDER — LIDOCAINE HCL (PF) 1 % IJ SOLN
INTRAMUSCULAR | Status: AC | PRN
  Administered 2023-08-16: 20 mL

## 2023-08-16 MED ORDER — MIDAZOLAM HCL 2 MG/2ML IJ SOLN
INTRAMUSCULAR | Status: AC
  Filled 2023-08-16: qty 4

## 2023-08-16 MED ORDER — MIDAZOLAM HCL 2 MG/2ML IJ SOLN
INTRAMUSCULAR | Status: AC | PRN
Start: 2023-08-16 — End: 2023-08-16
  Administered 2023-08-16 (×2): 1 mg via INTRAVENOUS

## 2023-08-16 MED ORDER — LIDOCAINE HCL 1 % IJ SOLN
INTRAMUSCULAR | Status: AC
  Filled 2023-08-16: qty 20

## 2023-08-16 MED ORDER — FENTANYL CITRATE (PF) 100 MCG/2ML IJ SOLN
INTRAMUSCULAR | Status: AC
  Filled 2023-08-16: qty 2

## 2023-08-16 NOTE — Progress Notes (Addendum)
Progress Note  Primary GI: Dr. Russella Dar  LOS: 6 days   Chief Complaint:Abnormal liver tests   Subjective  Patient sitting up in bed, mother at bedside.  Reports no events overnight.  He was able to tolerate soft diet for dinner.  No nausea, vomiting, or abdominal pain.  Last bowel movement was yesterday.  Reports urine is still dark.  Still has itching.  No new issues.  Pending liver biopsy today and then hopefully home.   Objective   Vital signs in last 24 hours: Temp:  [98.4 F (36.9 C)-98.7 F (37.1 C)] 98.7 F (37.1 C) (09/25 0639) Pulse Rate:  [67-76] 76 (09/25 0639) Resp:  [18] 18 (09/25 0639) BP: (118-147)/(73-95) 120/77 (09/25 0639) SpO2:  [97 %-100 %] 100 % (09/25 0639) Last BM Date : 08/14/23 Last BM recorded by nurses in past 5 days Stool Type: Type 2 (Lump and sausage like) (08/14/2023 10:00 AM)  General:   female in no acute distress  Eyes:ureteric sclera  Heart:  Regular rate and rhythm; no murmurs Pulm: Clear anteriorly; no wheezing Abdomen: soft, nondistended, normal bowel sounds in all quadrants. Nontender without guarding. No organomegaly appreciated. Extremities:  No edema Neurologic:  Alert and  oriented x4;  No focal deficits.  Psych:  Cooperative. Normal mood and affect.  Intake/Output from previous day: 09/24 0701 - 09/25 0700 In: 1260 [P.O.:60; I.V.:1200] Out: 1600 [Urine:1600] Intake/Output this shift: No intake/output data recorded.  Studies/Results: No results found.  Lab Results: Recent Labs    08/16/23 0434  WBC 3.4*  HGB 10.7*  HCT 32.4*  PLT 294   BMET Recent Labs    08/14/23 0420 08/15/23 0441 08/16/23 0434  NA 139 137 137  K 3.4* 3.5 3.6  CL 103 105 104  CO2 26 24 26   GLUCOSE 86 84 80  BUN <5* <5* 5*  CREATININE 0.60 0.50 0.73  CALCIUM 8.9 8.6* 8.4*   LFT Recent Labs    08/16/23 0434  PROT 6.2*  ALBUMIN 2.9*  AST 1,151*  ALT 1,403*  ALKPHOS 174*  BILITOT 10.7*   PT/INR Recent Labs    08/15/23 0441  08/16/23 0434  LABPROT 13.8 13.8  INR 1.0 1.0     Scheduled Meds:  amLODipine  10 mg Oral Daily   famotidine  20 mg Oral BID   fluticasone  1 spray Each Nare Daily   pantoprazole  40 mg Oral Daily   senna-docusate  1 tablet Oral BID   Continuous Infusions:  lactated ringers 50 mL/hr at 08/16/23 0105     Patient Narrative:  42 y.o. year old female with a history of a mixed connective tissue disorder, HTN. She was admitted with several abnormal liver tests.     Impression/Plan:  Nausea / vomiting / generalized abdominal pain / abnormal liver chemistries in mixed pattern. Viral etiology? Drug induced liver injury ( has had a few rounds of Amoxicillin-clavulanate in last few months, last round in mid July. Took Cephalexin for a UTI. Autoimmune liver disease? Ischemic injury seems unlikely. CT AP suggests a coarse granular appearance of both the liver and spleen parenchyma ( ? Maybe connective tissue disease involvement). [place] ABD- No gallstones or wall thickening visualized. We did get 2019 autoimmune eval and it showed abnl ANA and other serologies c/w lupus. Mono and Hepatitis panel negative. Mitochondrial Abs elevated. CMV IgM negative. Ceruloplasmin normal LFT's were trending downward, spiked today : AST  1117>960>1151  ALT 1549>1377>1493  Alk phosp 190 >164>174   Total  Bili 9.3>9.5 >10.7 ANA positive      Elevated IgG 1908       -Continue to monitor LFT's -pending ASMA and EBV  -Liver biopsy today with IR to r/o autoimmune, pending results may start steroids -Continue metoclopramide 5 mg ac & hs and Pantoprazole 40mg  bid -Benadryl prn for itching -Advance to soft diet this afternoon, if tolerates po can go home -Avoid hepatotoxic medications  -Follow up outpatient   Pelvic mass on CT Scan - favored to be a large subserosal fibroid exophytic from the ventral uterus. Per patient has been seen by GYN.   Connective tissue disease, gets weekly infusions of Benlysta   Principal  Problem:   Jaundice Active Problems:   Essential hypertension   Transaminitis   Right upper quadrant pain   Hypokalemia   Allergic rhinitis   Acute hepatitis   Pruritus   Nausea and vomiting   Deanna J May  08/16/2023, 8:29 AM    [facility] Attending   I have taken an interval history, reviewed the chart and examined the patient. I agree with the Advanced Practitioner's note, impression and recommendations with the following additions:  Tolerated liver bx  Plan:  Home today Start prednisone 40 mg every day (Rx at dc) I will f/u bx results She is to come for labs 08/21/23 at my office and I will make further recommendations  Iva Boop, MD, [facility] See Loretha Stapler on call - gastroenterology for best contact person 08/16/2023 4:16 PM

## 2023-08-16 NOTE — Progress Notes (Signed)
PROGRESS NOTE Michelle Rojas  YQM:[id number] DOB: [birth year] DOA: 08/10/2023 PCP: [facility]  Brief Narrative/Hospital Course: 60 old female with history of mixed connective tissue disorder diagnosed 2015, anxiety, fibromyalgia, GERD, hypertension presented to the ED with abnormal lab with elevated LFTs.  Patient had been nauseous past week and developing itching, some vomiting, dark urine, seen at the urgent care, thought she was having UTI but symptoms did not improve despite antibiotics and persisted with nausea vomiting, seen by PCP on 9/19 labs obtained showed elevated LFTs and sent to the ED.Patient has occasional alcohol use but denies daily drinking, no new medication has been getting injectable medication for MCTD for years and no excessive use of Tylenol reported. In the ED hemodynamically stable, labs confirmed abnormal LFTs, RUQ ultrasound-normal right upper quadrant, no gallstone or wall thickening, CBD 2.7 mm no focal lesion of the liver portal vein is patent Patient was admitted CT abdomen pelvis with contrast> shows coarse granular appearance in both the liver and spleen parenchyma no hepatosplenomegaly no discrete liver lesion, trace fluid in the abdomen, new since 2016 rounded midline pelvic mass measuring up to 8.9 cm located on top of the urinary bladder favor large soft serosal fibroid exophytic from the ventral uterus. 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 Having nausea vomiting, pruritus and discomfort  and on Reglan and Pepcid/PPI 9/23 IR consulted for liver biopsy.  9/25: Liver biopsy today.  Mitochondrial antibody elevated at 54.2, some worsening of liver enzymes and T. Bili, INR normal.  CMV negative, mononucleosis screen negative.    Subjective: Patient was seen and examined today.  No new concern.  Awaiting liver biopsy.  Assessment and  Plan: Principal Problem:   Jaundice Active Problems:   Essential hypertension   Transaminitis   Right upper quadrant pain   Hypokalemia   Allergic rhinitis   Acute hepatitis   Pruritus   Nausea and vomiting   Acute hepatitis w/ Jaundice Nausea vomiting: No obvious etiology.GI following closely.  RUQ ultrasound-normal right upper quadrant, no gallstone or wall thickening, CBD 2.7 mm no focal lesion of the liver portal vein is patent Patient was admitted CT abdomen pelvis with contrast> shows coarse granular appearance in both the liver and spleen parenchyma no hepatosplenomegaly no discrete liver lesion, trace fluid in the abdomen, new since 2016 rounded midline pelvic mass measuring up to 8.9 cm located on top of the urinary bladder favor large soft serosal fibroid exophytic from the ventral uterus. 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 9/23 IR consulted for liver biopsy-done today Having nausea vomiting, pruritus and discomfort  and on Reglan and Pepcid/PPI Continue to trend LFTs daily.  INR remains stable and reassuring.  Continue to avoid hepatotoxic meds.    Hepatitis panel negative. Liver enzymes with some worsening again today GI might start her on steroids-will appreciate their final recommendations  Hypokalemia: Resolved  Hypertension: BP well controlled on amlodipine  Allergic rhinitis: Continue Flonase prn  Mixed continue tissue disorder: She is on Belimumab infusion weekly by Dr Dierdre Forth. On hold while inpatient  Constipation: Continue Senokot twice daily and MiraLAX as needed  Obesity w/ Body mass index is 32.2 kg/m. Will benefit with PCP follow-up, weight loss  healthy lifestyle and sleep apnea evaluation as outpatient  DVT prophylaxis: SCDs Start: 08/10/23 2229 Code  Status:   Code Status: Full Code Family Communication: plan of care discussed with patient at bedside. Patient status is: Inpatient because of liver dysfunction Level of care: Med-Surg   Dispo: The patient is from: home            Anticipated disposition: TBD once okay with GI and LFTs stable/downtrending Objective: Vitals last 24 hrs: Vitals:   08/16/23 1120 08/16/23 1125 08/16/23 1149 08/16/23 1324  BP: 126/71 115/68 132/83 127/80  Pulse: 82 76 81 68  Resp: 16 16 18 19   Temp:   98.3 F (36.8 C) 98.2 F (36.8 C)  TempSrc:   Oral Oral  SpO2: 100% 100% 99% 100%  Weight:      Height:       Weight change:   Physical Examination: General.  Well-developed lady, in no acute distress.  Yellowish discoloration of skin and scleral icterus. Pulmonary.  Lungs clear bilaterally, normal respiratory effort. CV.  Regular rate and rhythm, no JVD, rub or murmur. Abdomen.  Soft, nontender, nondistended, BS positive. CNS.  Alert and oriented .  No focal neurologic deficit. Extremities.  No edema, no cyanosis, pulses intact and symmetrical. Psychiatry.  Judgment and insight appears normal.   Medications reviewed:  Scheduled Meds:  amLODipine  10 mg Oral Daily   famotidine  20 mg Oral BID   fluticasone  1 spray Each Nare Daily   pantoprazole  40 mg Oral Daily   senna-docusate  1 tablet Oral BID   Continuous Infusions:  lactated ringers 50 mL/hr at 08/16/23 0105    Diet Order             Diet regular Room service appropriate? Yes; Fluid consistency: Thin  Diet effective now                  Intake/Output Summary (Last 24 hours) at 08/16/2023 1533 Last data filed at 08/16/2023 1400 Gross per 24 hour  Intake 1310 ml  Output 1450 ml  Net -140 ml   Net IO Since Admission: 3,893.37 mL [08/16/23 1533]  Wt Readings from Last 3 Encounters:  08/13/23 90.5 kg  02/07/19 91.2 kg  09/17/18 95.3 kg     Unresulted Labs (From admission, onward)     Start     Ordered   08/16/23 0434   Pathologist smear review  Once,   R        08/16/23 0434   08/14/23 0500  Protime-INR  Daily,   R      08/13/23 0805   08/13/23 0500  Comprehensive metabolic panel  Daily,   R      08/12/23 0631   08/13/23 0500  HSV DNA by PCR (reference lab) Blood  Tomorrow morning,   R        08/12/23 1441   08/11/23 1522  Anti-smooth muscle antibody, IgG  Add-on,   AD        08/11/23 1522   08/11/23 1507  EBV ab to viral capsid ag pnl, IgG+IgM  Add-on,   AD        08/11/23 1506          Data Reviewed: I have personally reviewed following labs and imaging studies CBC: Recent Labs  Lab 08/10/23 1610 08/11/23 0549 08/16/23 0434  WBC 3.4* 3.3* 3.4*  NEUTROABS  --  1.0* 0.8*  HGB 12.4 12.2 10.7*  HCT 38.9 38.0 32.4*  MCV 79.4* 79.2* 75.5*  PLT 291 274 294  Basic Metabolic Panel: Recent Labs  Lab 08/10/23 2204 08/11/23 0549 08/12/23 0720 08/13/23 0517 08/14/23 0420 08/15/23 0441 08/16/23 0434  NA  --  136 138 138 139 137 137  K  --  3.6 3.5 3.5 3.4* 3.5 3.6  CL  --  104 105 103 103 105 104  CO2  --  24 26 28 26 24 26   GLUCOSE  --  81 96 90 86 84 80  BUN  --  7 <5* <5* <5* <5* 5*  CREATININE  --  0.44 0.67 0.67 0.60 0.50 0.73  CALCIUM  --  8.5* 8.9 8.7* 8.9 8.6* 8.4*  MG 1.9 1.8  --   --   --   --   --    GFR: Estimated Creatinine Clearance: 101.7 mL/min (by C-G formula based on SCr of 0.73 mg/dL). Liver Function Tests: Recent Labs  Lab 08/12/23 0720 08/13/23 0517 08/14/23 0420 08/15/23 0441 08/16/23 0434  AST 1,127* 1,172* 1,117* 960* 1,151*  ALT 1,722* 1,646* 1,549* 1,377* 1,403*  ALKPHOS 226* 203* 190* 164* 174*  BILITOT 9.0* 9.0* 9.3* 9.5* 10.7*  PROT 7.1 6.6 6.6 6.3* 6.2*  ALBUMIN 3.7 3.2* 3.2* 3.0* 2.9*   Recent Labs  Lab 08/10/23 1610  LIPASE 21   Recent Labs  Lab 08/10/23 2204  AMMONIA 21  Coagulation Profile: Recent Labs  Lab 08/11/23 0549 08/12/23 0516 08/14/23 0420 08/15/23 0441 08/16/23 0434  INR 1.0 1.0 1.0 1.0 1.0  No results found for  this or any previous visit (from the past 240 hour(s)).  Antimicrobials: Anti-infectives (From admission, onward)    None     Culture/Microbiology NONE  Radiology Studies: US BIOPSY (LIVER)  Result Date: 08/16/2023 INDICATION: Elevated liver function tests EXAM: Ultrasound-guided non targeted liver biopsy MEDICATIONS: None. ANESTHESIA/SEDATION: Moderate (conscious) sedation was employed during this procedure. A total of Versed 2 mg and Fentanyl 100 mcg was administered intravenously by the radiology nurse. Total intra-service moderate Sedation Time: 10 minutes. The patient's level of consciousness and vital signs were monitored continuously by radiology nursing throughout the procedure under my direct supervision. COMPLICATIONS: None immediate. PROCEDURE: Informed written consent was obtained from the patient after a thorough discussion of the procedural risks, benefits and alternatives. All questions were addressed. Maximal Sterile Barrier Technique was utilized including caps, mask, sterile gowns, sterile gloves, sterile drape, hand hygiene and skin antiseptic. A timeout was performed prior to the initiation of the procedure. Patient position supine on the ultrasound table. Right upper quadrant skin prepped and draped in the usual sterile fashion. Following local lidocaine administration, 17 gauge introducer needle was advanced into the right hepatic lobe, and three 18 gauge cores were obtained utilizing continuous ultrasound guidance. Gelfoam slurry was administered through the introducer needle at the biopsy site. Samples were sent to pathology in formalin. Needle removed and hemostasis achieved with 5 minutes of manual compression. Post procedure ultrasound images showed no evidence of significant hemorrhage. IMPRESSION: Ultrasound-guided non targeted liver biopsy. Electronically Signed   By: Acquanetta Belling M.D.   On: 08/16/2023 12:06     LOS: 6 days   Arnetha Courser, MD [facility]  08/16/2023, 3:33 PM

## 2023-08-16 NOTE — Procedures (Signed)
Interventional Radiology Procedure Note  Procedure: US guided random liver biopsy  Indication: Elevated LFT's  Findings: Please refer to procedural dictation for full description.  Complications: None  EBL: < 10 mL  Acquanetta Belling, MD [phone]

## 2023-08-16 NOTE — Plan of Care (Signed)

## 2023-08-17 DIAGNOSIS — B179 Acute viral hepatitis, unspecified: Secondary | ICD-10-CM | POA: Diagnosis not present

## 2023-08-17 DIAGNOSIS — R17 Unspecified jaundice: Secondary | ICD-10-CM | POA: Diagnosis not present

## 2023-08-17 DIAGNOSIS — E876 Hypokalemia: Secondary | ICD-10-CM | POA: Diagnosis not present

## 2023-08-17 DIAGNOSIS — I10 Essential (primary) hypertension: Secondary | ICD-10-CM | POA: Diagnosis not present

## 2023-08-17 LAB — COMPREHENSIVE METABOLIC PANEL
ALT: 1426 U/L — ABNORMAL HIGH (ref 0–44)
AST: 1083 U/L — ABNORMAL HIGH (ref 15–41)
Albumin: 3.1 g/dL — ABNORMAL LOW (ref 3.5–5.0)
Alkaline Phosphatase: 181 U/L — ABNORMAL HIGH (ref 38–126)
Anion gap: 5 (ref 5–15)
BUN: 5 mg/dL — ABNORMAL LOW (ref 6–20)
CO2: 24 mmol/L (ref 22–32)
Calcium: 8.4 mg/dL — ABNORMAL LOW (ref 8.9–10.3)
Chloride: 105 mmol/L (ref 98–111)
Creatinine, Ser: 0.63 mg/dL (ref 0.44–1.00)
GFR, Estimated: >60 mL/min (ref >60)
Glucose, Bld: 86 mg/dL (ref 70–99)
Potassium: 3.6 mmol/L (ref 3.5–5.1)
Sodium: 134 mmol/L — ABNORMAL LOW (ref 135–145)
Total Bilirubin: 12.4 mg/dL — ABNORMAL HIGH (ref 0.3–1.2)
Total Protein: 6.6 g/dL (ref 6.5–8.1)

## 2023-08-17 LAB — PROTIME-INR
INR: 1.1 (ref 0.8–1.2)
Prothrombin Time: 14.1 seconds (ref 11.4–15.2)

## 2023-08-17 MED ORDER — FAMOTIDINE 20 MG PO TABS: 20.0000 mg | ORAL_TABLET | Freq: Two times a day (BID) | ORAL | 1 refills | Status: DC

## 2023-08-17 MED ORDER — PREDNISONE 20 MG PO TABS: 40.0000 mg | ORAL_TABLET | Freq: Every day | ORAL | 0 refills | Status: DC

## 2023-08-17 MED ORDER — DIPHENHYDRAMINE HCL 25 MG PO CAPS: 25.0000 mg | ORAL_CAPSULE | Freq: Four times a day (QID) | ORAL | 0 refills | Status: DC | PRN

## 2023-08-17 MED ORDER — PREDNISONE 20 MG PO TABS
40.0000 mg | ORAL_TABLET | Freq: Every day | ORAL | Status: DC
  Administered 2023-08-17: 40 mg via ORAL
  Filled 2023-08-17: qty 2

## 2023-08-17 MED ORDER — PANTOPRAZOLE SODIUM 40 MG PO TBEC: 40.0000 mg | DELAYED_RELEASE_TABLET | Freq: Every day | ORAL | 1 refills | Status: DC

## 2023-08-17 NOTE — Discharge Summary (Signed)
Physician Discharge Summary   Patient: Michelle Rojas MRN: [id number] DOB: July 20, 1981  Admit date:     08/10/2023  Discharge date: 08/17/23  Discharge Physician: Arnetha Courser   PCP: [facility]   Recommendations at discharge:  Please obtain CBC and CMP on follow-up Liver biopsy results are pending Patient is being discharged on prednisone for concern of autoimmune hepatitis Follow-up with gastroenterology Follow-up with rheumatology Follow-up with primary care provider  Discharge Diagnoses: Principal Problem:   Jaundice Active Problems:   Essential hypertension   Transaminitis   Right upper quadrant pain   Hypokalemia   Allergic rhinitis   Acute hepatitis   Pruritus   Nausea and vomiting   Hospital Course: 78 old female with history of mixed connective tissue disorder diagnosed 2015, anxiety, fibromyalgia, GERD, hypertension presented to the ED with abnormal lab with elevated LFTs.  Patient had been nauseous past week and developing itching, some vomiting, dark urine, seen at the urgent care, thought she was having UTI but symptoms did not improve despite antibiotics and persisted with nausea vomiting, seen by PCP on 9/19 labs obtained showed elevated LFTs and sent to the ED.Patient has occasional alcohol use but denies daily drinking, no new medication has been getting injectable medication for MCTD for years and no excessive use of Tylenol reported. In the ED hemodynamically stable, labs confirmed abnormal LFTs, RUQ ultrasound-normal right upper quadrant, no gallstone or wall thickening, CBD 2.7 mm no focal lesion of the liver portal vein is patent Patient was admitted CT abdomen pelvis with contrast> shows coarse granular appearance in both the liver and spleen parenchyma no hepatosplenomegaly no discrete liver lesion, trace fluid in the abdomen, new since 2016 rounded midline pelvic mass measuring up to 8.9 cm located on top of the urinary bladder favor large  soft serosal fibroid exophytic from the ventral uterus. 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 Having nausea vomiting, pruritus and discomfort  and on Reglan and Pepcid/PPI 9/23 IR consulted for liver biopsy.  9/25: Liver biopsy today.  Mitochondrial antibody elevated at 54.2, some worsening of liver enzymes and T. Bili, INR normal.  CMV negative, mononucleosis screen negative.  9/26: Patient had her liver biopsy done yesterday.  Worsening liver enzymes and T. bili.  Discussed with GI and they are okay with discharge on prednisone with a close outpatient follow-up.  Is being discharged on prednisone 40 mg daily for concern of autoimmune hepatitis as acute hepatitis panel negative.  GI will follow-up closely as outpatient for biopsy results and they will start tapering as appropriate.  She will continue taking current dose of prednisone with Protonix and Pepcid until her GI follow-up on Monday, likely will need a prolonged course of steroid.  Patient otherwise remained stable, appetite remained little poor.  She was advised to keep herself well-hydrated and supplement her diet as  needed.  Patient will continue on current medications and follow-up closely with her providers for further recommendations.  Consultants: Gastroenterology Procedures performed: Liver biopsy Disposition: Home Diet recommendation:  Discharge Diet Orders (From admission, onward)     Start     Ordered   08/17/23 0000  Diet - low sodium heart healthy        08/17/23 1012           Cardiac diet DISCHARGE MEDICATION: Allergies as of 08/17/2023   No Known Allergies      Medication  List     STOP taking these medications    buPROPion 150 MG 24 hr tablet Commonly known as: WELLBUTRIN XL   cephALEXin 500 MG capsule Commonly known as: KEFLEX   Incassia 0.35 MG tablet Generic drug: norethindrone    ondansetron 4 MG disintegrating tablet Commonly known as: ZOFRAN-ODT   phenazopyridine 200 MG tablet Commonly known as: PYRIDIUM   telmisartan-hydrochlorothiazide 40-12.5 MG tablet Commonly known as: MICARDIS HCT       TAKE these medications    amLODipine 10 MG tablet Commonly known as: NORVASC Take 10 mg by mouth daily.   Benlysta 200 MG/ML Soaj Generic drug: Belimumab once a week.   diclofenac sodium 1 % Gel Commonly known as: VOLTAREN Apply 4 g topically 4 (four) times daily.   diphenhydrAMINE 25 mg capsule Commonly known as: BENADRYL Take 1 capsule (25 mg total) by mouth every 6 (six) hours as needed for itching.   famotidine 20 MG tablet Commonly known as: PEPCID Take 1 tablet (20 mg total) by mouth 2 (two) times daily.   fluticasone 50 MCG/ACT nasal spray Commonly known as: FLONASE Place 1 spray into both nostrils daily.   ibuprofen 600 MG tablet Commonly known as: ADVIL Take 1 tablet (600 mg total) every 6 (six) hours by mouth. What changed:  when to take this reasons to take this   meclizine 25 MG tablet Commonly known as: ANTIVERT Take 1 tablet (25 mg total) by mouth 3 (three) times daily as needed for dizziness.   naproxen 500 MG tablet Commonly known as: NAPROSYN Take 500 mg by mouth as needed (Pain).   ondansetron 4 MG tablet Commonly known as: ZOFRAN Take 4 mg by mouth every 8 (eight) hours as needed.   pantoprazole 40 MG tablet Commonly known as: PROTONIX Take 1 tablet (40 mg total) by mouth daily. Start taking on: August 18, 2023   predniSONE 20 MG tablet Commonly known as: DELTASONE Take 2 tablets (40 mg total) by mouth daily with breakfast.   traMADol 50 MG tablet Commonly known as: ULTRAM Take 1-2 tablets (50-100 mg total) by mouth daily as needed.   valACYclovir 500 MG tablet Commonly known as: VALTREX Take 500 mg by mouth as needed (fever blisters).   Vitamin D (Ergocalciferol) 1.25 MG (50000 UNIT) Caps  capsule Commonly known as: DRISDOL Take 50,000 Units by mouth every 7 (seven) days.               Discharge Care Instructions  (From admission, onward)           Start     Ordered   08/17/23 0000  No dressing needed        08/17/23 1012            Follow-up Information     Iva Boop, MD Follow up on 08/21/2023.   Specialty: Gastroenterology Why: Go to lab in basement 730-5 and have blood drawn - results will be called and recommendations provided Contact information: [address] [place] [address] [phone]         [facility] Schedule an appointment as soon as possible for a visit in 1 week(s).   Contact information: [address] [place] [address] [phone]                Discharge Exam: Filed Weights   08/12/23 0500 08/13/23 0500 08/17/23 0500  Weight: 89.3 kg 90.5 kg 90.6 kg   General.  Well-developed lady in  no acute distress.  Scleral icterus and skin pallor noted Pulmonary.  Lungs clear bilaterally, normal respiratory effort. CV.  Regular rate and rhythm, no JVD, rub or murmur. Abdomen.  Soft, nontender, nondistended, BS positive. CNS.  Alert and oriented .  No focal neurologic deficit. Extremities.  No edema, no cyanosis, pulses intact and symmetrical. Psychiatry.  Judgment and insight appears normal.   Condition at discharge: stable  The results of significant diagnostics from this hospitalization (including imaging, microbiology, ancillary and laboratory) are listed below for reference.   Imaging Studies: US BIOPSY (LIVER)  Result Date: 08/16/2023 INDICATION: Elevated liver function tests EXAM: Ultrasound-guided non targeted liver biopsy MEDICATIONS: None. ANESTHESIA/SEDATION: Moderate (conscious) sedation was employed during this procedure. A total of Versed 2 mg and Fentanyl 100 mcg was administered intravenously by the radiology nurse. Total intra-service moderate Sedation Time:  10 minutes. The patient's level of consciousness and vital signs were monitored continuously by radiology nursing throughout the procedure under my direct supervision. COMPLICATIONS: None immediate. PROCEDURE: Informed written consent was obtained from the patient after a thorough discussion of the procedural risks, benefits and alternatives. All questions were addressed. Maximal Sterile Barrier Technique was utilized including caps, mask, sterile gowns, sterile gloves, sterile drape, hand hygiene and skin antiseptic. A timeout was performed prior to the initiation of the procedure. Patient position supine on the ultrasound table. Right upper quadrant skin prepped and draped in the usual sterile fashion. Following local lidocaine administration, 17 gauge introducer needle was advanced into the right hepatic lobe, and three 18 gauge cores were obtained utilizing continuous ultrasound guidance. Gelfoam slurry was administered through the introducer needle at the biopsy site. Samples were sent to pathology in formalin. Needle removed and hemostasis achieved with 5 minutes of manual compression. Post procedure ultrasound images showed no evidence of significant hemorrhage. IMPRESSION: Ultrasound-guided non targeted liver biopsy. Electronically Signed   By: Acquanetta Belling M.D.   On: 08/16/2023 12:06   CT ABDOMEN PELVIS W CONTRAST  Result Date: 08/11/2023 CLINICAL DATA:  42 year old female with dark urine, jaundice. Abnormal LFTs. On antibiotics for UTI. History of a mixed connective tissue disorder. Right upper quadrant pain. Abdomen and flank pain. EXAM: CT ABDOMEN AND PELVIS WITH CONTRAST TECHNIQUE: Multidetector CT imaging of the abdomen and pelvis was performed using the standard protocol following bolus administration of intravenous contrast. RADIATION DOSE REDUCTION: This exam was performed according to the departmental dose-optimization program which includes automated exposure control, adjustment of the mA  and/or kV according to patient size and/or use of iterative reconstruction technique. CONTRAST:  OMNIPAQUE IOHEXOL 300 MG/ML  SOLN COMPARISON:  Abdomen ultrasound yesterday. CT Abdomen and Pelvis 02/09/2015. FINDINGS: Lower chest: No cardiomegaly, pericardial or pleural effusion. Lung bases appear negative except for mild lower lobe curvilinear scarring or atelectasis. Hepatobiliary: Coarse granular appearance of the liver parenchyma (series 2, image 6) but no hepatomegaly or discrete liver lesion. Hepatic veins and portal veins appear to be patent and appear fairly unremarkable. The gallbladder is partially contracted and unremarkable. No biliary ductal dilatation. No perihepatic free fluid. Pancreas: Negative.  No pancreatic ductal dilatation or atrophy. Spleen: Diminutive spleen although with a similar course enhancement pattern as that seen in the liver (series 2, image 4). No perisplenic fluid. Adrenals/Urinary Tract: Negative adrenal glands and nonobstructed kidneys. No delayed renal images. Small right renal mid pole benign-appearing cyst (no follow-up imaging recommended). Diminutive bladder. Occasional pelvic phleboliths. Stomach/Bowel: No dilated bowel loops. Hyperdense retained stool in the large bowel. There is trace free  fluid in the right lower quadrant adjacent to the lateral cecum on series 8, image 54. The appendix arising medial from the cecum appears normal on coronal image 59. No convincing large bowel inflammation. Decompressed stomach and duodenum. No free air. Vascular/Lymphatic: Portal venous system appears to be patent and nondilated. Major arterial structures in the abdomen and pelvis appear patent and normal. No calcified atherosclerosis or lymphadenopathy identified. Reproductive: There is a round, lobulated, heterogeneously hyperdense soft tissue mass in the anterior pelvis overlying the urinary bladder which is new from 2016, although appears inseparable from and likely exophytic  from the ventral uterus on sagittal image 96. The exophytic mass is 75 x 89 x 72 mm (AP by transverse by CC) mild regional mass effect. No regional inflammation. Evidence of a small additional left uterine fundal fibroid on sagittal image 94. Uterus and adnexa otherwise appear negative. Other: No small volume of simple fluid density free fluid in the pelvis, primarily the cul-de-sac on series 2, image 62. Musculoskeletal: No acute or suspicious osseous lesion identified. IMPRESSION: 1. Coarse granular appearance of both the liver and spleen parenchyma. But no associated hepatosplenomegaly. No discrete liver lesion. No stigmata of portal venous hypertension. And no gallbladder or biliary ductal dilatation. Etiology is unclear, but might reflect an intrinsic hepatocellular disease. Liver and spleen involvement by the underlying connective tissue disorder not excluded. 2. There is trace free fluid in the abdomen and pelvis with simple fluid density. No other acute or inflammatory process identified in the abdomen. 3. New since 2016 rounded midline pelvic mass measuring up to 8.9 cm diameter located on top of the urinary bladder. But favor this is a large subserosal fibroid exophytic from the ventral uterus. A smaller uterine fundal fibroid is also identified. Electronically Signed   By: Odessa Fleming M.D.   On: 08/11/2023 07:20   US Abdomen Limited RUQ (LIVER/GB)  Result Date: 08/10/2023 CLINICAL DATA:  Vomiting EXAM: ULTRASOUND ABDOMEN LIMITED RIGHT UPPER QUADRANT COMPARISON:  Abdominal ultrasound 05/10/2017 FINDINGS: Gallbladder: No gallstones or wall thickening visualized. No sonographic Murphy sign noted by sonographer. Common bile duct: Diameter: 2.7 mm Liver: No focal lesion identified. Within normal limits in parenchymal echogenicity. Portal vein is patent on color Doppler imaging with normal direction of blood flow towards the liver. Other: None. IMPRESSION: Normal right upper quadrant ultrasound. Electronically  Signed   By: Darliss Cheney M.D.   On: 08/10/2023 22:14    Microbiology: Results for orders placed or performed during the hospital encounter of 09/02/17  Culture, OB Urine     Status: Abnormal   Collection Time: 09/02/17  5:10 PM   Specimen: Urine, Clean Catch  Result Value Ref Range Status   Specimen Description URINE, CLEAN CATCH  Final   Special Requests NONE  Final   Culture (A)  Final    MULTIPLE SPECIES PRESENT, SUGGEST RECOLLECTION NO GROUP B STREP (S.AGALACTIAE) ISOLATED Performed at [facility], [address] [place], [place] [address]    Report Status 09/05/2017 FINAL  Final    Labs: CBC: Recent Labs  Lab 08/10/23 1610 08/11/23 0549 08/16/23 0434  WBC 3.4* 3.3* 3.4*  NEUTROABS  --  1.0* 0.8*  HGB 12.4 12.2 10.7*  HCT 38.9 38.0 32.4*  MCV 79.4* 79.2* 75.5*  PLT 291 274 294   Basic Metabolic Panel: Recent Labs  Lab 08/10/23 2204 08/11/23 0549 08/12/23 0720 08/13/23 0517 08/14/23 0420 08/15/23 0441 08/16/23 0434 08/17/23 0418  NA  --  136   < >  138 139 137 137 134*  K  --  3.6   < > 3.5 3.4* 3.5 3.6 3.6  CL  --  104   < > 103 103 105 104 105  CO2  --  24   < > 28 26 24 26 24   GLUCOSE  --  81   < > 90 86 84 80 86  BUN  --  7   < > <5* <5* <5* 5* 5*  CREATININE  --  0.44   < > 0.67 0.60 0.50 0.73 0.63  CALCIUM  --  8.5*   < > 8.7* 8.9 8.6* 8.4* 8.4*  MG 1.9 1.8  --   --   --   --   --   --    < > = values in this interval not displayed.   Liver Function Tests: Recent Labs  Lab 08/13/23 0517 08/14/23 0420 08/15/23 0441 08/16/23 0434 08/17/23 0418  AST 1,172* 1,117* 960* 1,151* 1,083*  ALT 1,646* 1,549* 1,377* 1,403* 1,426*  ALKPHOS 203* 190* 164* 174* 181*  BILITOT 9.0* 9.3* 9.5* 10.7* 12.4*  PROT 6.6 6.6 6.3* 6.2* 6.6  ALBUMIN 3.2* 3.2* 3.0* 2.9* 3.1*   CBG: No results for input(s): "GLUCAP" in the last 168 hours.  Discharge time spent: greater than 30 minutes.  This record has been created using Manufacturing engineer. Errors have been sought and corrected,but may not always be located. Such creation errors do not reflect on the standard of care.   Signed: Arnetha Courser, MD [facility] 08/17/2023

## 2023-08-17 NOTE — Progress Notes (Signed)
Discharge instructions given to patient and all questions were answered.  

## 2023-08-21 ENCOUNTER — Other Ambulatory Visit (INDEPENDENT_AMBULATORY_CARE_PROVIDER_SITE_OTHER): Payer: 59

## 2023-08-21 ENCOUNTER — Encounter: Payer: Self-pay | Admitting: Internal Medicine

## 2023-08-21 DIAGNOSIS — R17 Unspecified jaundice: Secondary | ICD-10-CM

## 2023-08-21 DIAGNOSIS — D509 Iron deficiency anemia, unspecified: Secondary | ICD-10-CM

## 2023-08-21 LAB — COMPREHENSIVE METABOLIC PANEL
ALT: 652 U/L — ABNORMAL HIGH (ref 0–35)
AST: 220 U/L — ABNORMAL HIGH (ref 0–37)
Albumin: 3.9 g/dL (ref 3.5–5.2)
Alkaline Phosphatase: 210 U/L — ABNORMAL HIGH (ref 39–117)
BUN: 8 mg/dL (ref 6–23)
CO2: 31 meq/L (ref 19–32)
Calcium: 9.1 mg/dL (ref 8.4–10.5)
Chloride: 104 meq/L (ref 96–112)
Creatinine, Ser: 0.75 mg/dL (ref 0.40–1.20)
GFR: 96.83 mL/min (ref >60.00)
Glucose, Bld: 118 mg/dL — ABNORMAL HIGH (ref 70–99)
Potassium: 3.9 meq/L (ref 3.5–5.1)
Sodium: 141 meq/L (ref 135–145)
Total Bilirubin: 7.6 mg/dL — ABNORMAL HIGH (ref 0.2–1.2)
Total Protein: 7.2 g/dL (ref 6.0–8.3)

## 2023-08-21 LAB — PROTIME-INR
INR: 1 {ratio} (ref 0.8–1.0)
Prothrombin Time: 10.7 s (ref 9.6–13.1)

## 2023-08-21 LAB — CBC
HCT: 38.4 % (ref 36.0–46.0)
Hemoglobin: 12.4 g/dL (ref 12.0–15.0)
MCHC: 32.3 g/dL (ref 30.0–36.0)
MCV: 77.7 fL — ABNORMAL LOW (ref 78.0–100.0)
Platelets: 360 10*3/uL (ref 150.0–400.0)
RBC: 4.95 Mil/uL (ref 3.87–5.11)
RDW: 17.6 % — ABNORMAL HIGH (ref 11.5–15.5)
WBC: 6.7 10*3/uL (ref 4.0–10.5)

## 2023-08-21 LAB — SURGICAL PATHOLOGY

## 2023-08-22 ENCOUNTER — Other Ambulatory Visit: Payer: Self-pay

## 2023-08-22 ENCOUNTER — Telehealth: Payer: Self-pay | Admitting: Internal Medicine

## 2023-08-22 DIAGNOSIS — K754 Autoimmune hepatitis: Secondary | ICD-10-CM

## 2023-08-22 LAB — IRON,TIBC AND FERRITIN PANEL
%SAT: 39 % (ref 16–45)
Ferritin: 110 ng/mL (ref 16–232)
Iron: 136 ug/dL (ref 40–190)
TIBC: 346 ug/dL (ref 250–450)

## 2023-08-22 NOTE — Telephone Encounter (Signed)
Error

## 2023-08-28 ENCOUNTER — Other Ambulatory Visit (INDEPENDENT_AMBULATORY_CARE_PROVIDER_SITE_OTHER): Payer: 59

## 2023-08-28 DIAGNOSIS — K754 Autoimmune hepatitis: Secondary | ICD-10-CM

## 2023-08-28 LAB — COMPREHENSIVE METABOLIC PANEL
ALT: 473 U/L — ABNORMAL HIGH (ref 0–35)
AST: 228 U/L — ABNORMAL HIGH (ref 0–37)
Albumin: 3.6 g/dL (ref 3.5–5.2)
Alkaline Phosphatase: 188 U/L — ABNORMAL HIGH (ref 39–117)
BUN: 12 mg/dL (ref 6–23)
CO2: 28 meq/L (ref 19–32)
Calcium: 8.8 mg/dL (ref 8.4–10.5)
Chloride: 106 meq/L (ref 96–112)
Creatinine, Ser: 0.75 mg/dL (ref 0.40–1.20)
GFR: 96.82 mL/min (ref >60.00)
Glucose, Bld: 93 mg/dL (ref 70–99)
Potassium: 3.6 meq/L (ref 3.5–5.1)
Sodium: 143 meq/L (ref 135–145)
Total Bilirubin: 3.3 mg/dL — ABNORMAL HIGH (ref 0.2–1.2)
Total Protein: 6.6 g/dL (ref 6.0–8.3)

## 2023-08-28 LAB — PROTIME-INR
INR: 0.9 {ratio} (ref 0.8–1.0)
Prothrombin Time: 9.9 s (ref 9.6–13.1)

## 2023-08-29 ENCOUNTER — Encounter: Payer: Self-pay | Admitting: Internal Medicine

## 2023-08-29 ENCOUNTER — Ambulatory Visit (INDEPENDENT_AMBULATORY_CARE_PROVIDER_SITE_OTHER): Payer: 59 | Admitting: Internal Medicine

## 2023-08-29 VITALS — BP 142/86 | HR 69 | Ht 66.0 in | Wt 198.0 lb

## 2023-08-29 DIAGNOSIS — R1319 Other dysphagia: Secondary | ICD-10-CM | POA: Diagnosis not present

## 2023-08-29 DIAGNOSIS — K754 Autoimmune hepatitis: Secondary | ICD-10-CM | POA: Diagnosis not present

## 2023-08-29 DIAGNOSIS — L299 Pruritus, unspecified: Secondary | ICD-10-CM

## 2023-08-29 DIAGNOSIS — R935 Abnormal findings on diagnostic imaging of other abdominal regions, including retroperitoneum: Secondary | ICD-10-CM | POA: Diagnosis not present

## 2023-08-29 MED ORDER — URSODIOL 300 MG PO CAPS: 600.0000 mg | ORAL_CAPSULE | Freq: Two times a day (BID) | ORAL | 5 refills | Status: DC

## 2023-08-29 MED ORDER — HYDROXYZINE HCL 25 MG PO TABS: ORAL_TABLET | ORAL | 2 refills | Status: DC

## 2023-08-29 NOTE — Patient Instructions (Signed)
You have been scheduled for an endoscopy. Please follow written instructions given to you at your visit today.  If you use inhalers (even only as needed), please bring them with you on the day of your procedure.  If you take any of the following medications, they will need to be adjusted prior to your procedure:   DO NOT TAKE 7 DAYS PRIOR TO TEST- Trulicity (dulaglutide) Ozempic, Wegovy (semaglutide) Mounjaro (tirzepatide) Bydureon Bcise (exanatide extended release)  DO NOT TAKE 1 DAY PRIOR TO YOUR TEST Rybelsus (semaglutide) Adlyxin (lixisenatide) Victoza (liraglutide) Byetta (exanatide) ___________________________________________________________________________   PLEASE COME TO OUR LAB LOCATED IN THE BASEMENT OCTOBER 21ST AND GET LABS DRAWN. NO APPOINTMENT NEEDED. THEY ARE OPEN 7:30AM-5:30PM. YOU DON'T HAVE TO BE FASTING.  We are placing a referral to [facility] in [place]. They are located at : [address] [place] [address], phone # [phone]. They will call you to set this appointment up.   I appreciate the opportunity to care for you. Stan Head, MD. Clementeen Graham

## 2023-08-29 NOTE — Progress Notes (Addendum)
Michelle Rojas 42 y.o. [birth year] [id number]  Assessment & Plan:   Encounter Diagnoses  Name Primary?   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  She remains pruritic.  That is unusual for classic autoimmune hepatitis and her mitochondrial antibody positivity raises a question of an overlap syndrome.    She is being referred to hepatology at the [facility] clinic.  She will continue prednisone at 40 mg daily.  Add the following medicines below also.  Meds ordered this encounter  Medications   hydrOXYzine (ATARAX) 25 MG tablet    Sig: 1-2 tablets at bedtime prn itching    Dispense:  60 tablet    Refill:  2   ursodiol (ACTIGALL) 300 MG capsule    Sig: Take 2 capsules (600 mg total) by mouth 2 (two) times daily.    Dispense:  120 capsule    Refill:  5   Labs in about 2 weeks to follow-up disease status and also immunity to infectious hepatitis.  She may need vaccination against HAV and HBV.  She does not recall history of this. Orders Placed This Encounter  Procedures   IgG   Hepatic function panel   Hepatitis B Surface AntiBODY   Hepatitis B Core Antibody, total   Hepatitis A Ab, Total   Ambulatory referral to Gastroenterology   EGD to evaluate dysphagia and upper GI symptoms that she has had though they are better there is persistent dysphagia.  Previous minimal dysmotility in 2016.  Given her connective tissue disease that may be the issue i.e. dysmotility.  Continue PPI.  When she was at the hospital she had reported that GYN had evaluated her, will clarify and make sure question if she needs additional follow-up for the uterine abnormality seen on CT scan.  Copy to Dr. Phillips Grout [facility]  Subjective:  Gastroenterology summary:  Acute hepatitis with jaundice and  pruritus: Hospitalized 9/19 - 08/17/2023 A. LIVER, BIOPSY: 08/16/2023 - Features of marked acute or subacute hepatitis with focal bridging parenchymal collapse.  See comment - No pathologic fibrosis The biopsy is adequate for review. There is marked lobular and portal inflammation with mixed infiltrates of lymphocytes, plasma cells, neutrophils, and eosinophils. There is extensive interface hepatitis with associated periportal hepatocyte necrosis. There is evidence of bile duct injury and loss but prominent ductular reaction is not seen. Hepatic lobules also show marked cholestasis, mostly canalicular. There are multiple foci of perivenular confluent necrosis/ dropout with areas of bridging necrosis. Numerous plasma cells are present around central veins and at interface hepatitis. Trichrome and reticulin stains show lobular collapse but definite chronic fibrosis is not seen. Iron stain is negative and PAS-D stain shows no cytoplasmic inclusions. In light of patients positive ANA and ASMA antibodies, differential diagnosis mainly includes early autoimmune hepatitis but can also include drug-induced hepatitis with autoimmune features. Clinical correlation, including patient's drug and over the counter medications/ agents history is suggested.    Right upper quadrant ultrasound normal 08/10/2023  CT scan abdomen pelvis 08/11/2023 IMPRESSION: 1. Coarse granular appearance of both the liver and spleen parenchyma. But no associated hepatosplenomegaly. No discrete liver lesion. No stigmata of portal venous hypertension. And no gallbladder or biliary ductal dilatation. Etiology is unclear, but might reflect an intrinsic hepatocellular disease. Liver and spleen involvement by the underlying connective tissue  disorder not excluded.   2. There is trace free fluid in the abdomen and pelvis with simple fluid density. No other acute or inflammatory process identified in the abdomen.   3. New  since 2016 rounded midline pelvic mass measuring up to 8.9 cm diameter located on top of the urinary bladder. But favor this is a large subserosal fibroid exophytic from the ventral uterus. A smaller uterine fundal fibroid is also identified   Serologic workup: Positive ANA, anti-smooth muscle antibody positive at 80 (F-actin IgG) mitochondrial antibodies IgG positive at 54.2, IgG level 1908, GGT 82 LDH 465  Hepatitis A, B and C acute panel negative, ceruloplasmin elevated at 42.4, ethanol acetaminophen levels negative, mono screen negative, EBV consistent with prior infection CMV IgM negative  Did have exposure to Augmentin and cephalexin prior to admission for jaundice in the months and days previous.  2019 labs ANA + 1:640 and also had anti ds DNA  2018 elevated alkaline phosphatase though was pregnant at the time ultrasound of the liver unrevealing  April 2016 office evaluation [facility] with chronic hoarseness early satiety, frequent stools and some weight loss and barium swallow showed minimal dysmotility, normal 2-hour gastric emptying scan.   -------------------------------------------------------------------------------------------- Discussed the use of AI scribe software for clinical note transcription with the patient, who gave verbal consent to proceed.  Chief Complaint: Follow-up of hepatitis and jaundice  HPI 42 year old woman with a history of mixed connective tissue  And she presents with her husband for a follow-up visit after hospitalization September 19 August 17, 2023 with diagnosis of autoimmune hepatitis and jaundice.  She was treated with prednisone 40 mg daily.  Upon initial presentation she had a lot of nausea and vomiting and upper abdominal pain that subsided.  The patient reports feeling lethargic and experiencing increased hunger, since starting prednisone treatment. She continues to experience significant itching, particularly at night, which has remained  unchanged since her hospital stay.  The patient also reports persistent issues with nausea and vomiting, which seem to be triggered by certain foods, particularly meat and sweets. The patient describes a sensation of food moving slowly down the esophagus or stopping altogether, leading to regurgitation. This issue appears to be a new development, with the only similar experience occurring during a previous pregnancy. overall these symptoms are much less than when she was in the hospital last month. meat seems to trigger dysphagia so she as avoided it.            Component Ref Range & Units 1 d ago (08/28/23) 8 d ago (08/21/23) 12 d ago (08/17/23) 13 d ago (08/16/23) 2 wk ago (08/15/23) 2 wk ago (08/14/23) 2 wk ago (08/13/23)  Sodium 135 - 145 mEq/L 143 141 134 Low  R 137 R 137 R 139 R 138 R  Potassium 3.5 - 5.1 mEq/L 3.6 3.9 3.6 R 3.6 R 3.5 R 3.4 Low  R 3.5 R  Chloride 96 - 112 mEq/L 106 104 105 R 104 R 105 R 103 R 103 R  CO2 19 - 32 mEq/L 28 31 24  R 26 R 24 R 26 R 28 R  Glucose, Bld 70 - 99 mg/dL 93 664 High  86 CM 80 CM 84 CM 86 CM 90 CM  BUN 6 - 23 mg/dL 12 8 5  Low  R 5 Low  R <5 Low  R <5 Low  R <5 Low  R  Creatinine, Ser 0.40 - 1.20 mg/dL 4.03 4.74 2.59 R 5.63 R 0.50 R  0.60 R 0.67 R  Total Bilirubin 0.2 - 1.2 mg/dL 3.3 High  7.6 High  91.4 High  R 10.7 High  R 9.5 High  R 9.3 High  R 9.0 High  R  Alkaline Phosphatase 39 - 117 U/L 188 High  210 High  181 High  R 174 High  R 164 High  R 190 High  R 203 High  R  AST 0 - 37 U/L 228 High  220 High  1,083 High  R 1,151 High  R 960 High  R 1,117 High  R 1,172 High  R  ALT 0 - 35 U/L 473 High  652 High  1,426 High  R 1,403 High  R 1,377 High  R 1,549 High  R 1,646 High  R  Total Protein 6.0 - 8.3 g/dL 6.6 7.2 6.6 R 6.2 Low  R 6.3 Low  R 6.6 R 6.6 R  Albumin 3.5 - 5.2 g/dL 3.6 3.9 3.1 Low  R 2.9 Low  R 3.0 Low  R 3.2 Low  R 3.2 Low  R  GFR >60.00 mL/min 96.82 96.83 CM       Comment: Calculated using the CKD-EPI Creatinine Equation  (2021)  Calcium 8.4 - 10.5 mg/dL 8.8 9.1 8.4 Low  R 8.4 Low  R 8.6 Low  R 8.9 R 8.7 Low  R      Lab Results  Component Value Date   INR 0.9 08/28/2023   INR 1.0 08/21/2023   INR 1.1 08/17/2023   Lab Results  Component Value Date   WBC 6.7 08/21/2023   HGB 12.4 08/21/2023   HCT 38.4 08/21/2023   MCV 77.7 (L) 08/21/2023   PLT 360.0 08/21/2023     Prior ba swallow in 2016 has shown mild dysmotility.       No Known Allergies Current Meds  Medication Sig   amLODipine (NORVASC) 10 MG tablet Take 10 mg by mouth daily.   BENLYSTA 200 MG/ML SOAJ once a week.   diclofenac sodium (VOLTAREN) 1 % GEL Apply 4 g topically 4 (four) times daily. (Patient taking differently: Apply 4 g topically as needed.)   famotidine (PEPCID) 20 MG tablet Take 1 tablet (20 mg total) by mouth 2 (two) times daily.   fluticasone (FLONASE) 50 MCG/ACT nasal spray Place 1 spray into both nostrils daily.   hydrOXYzine (ATARAX) 25 MG tablet 1-2 tablets at bedtime prn itching   ibuprofen (ADVIL,MOTRIN) 600 MG tablet Take 1 tablet (600 mg total) every 6 (six) hours by mouth. (Patient taking differently: Take 600 mg by mouth as needed.)   meclizine (ANTIVERT) 25 MG tablet Take 1 tablet (25 mg total) by mouth 3 (three) times daily as needed for dizziness.   naproxen (NAPROSYN) 500 MG tablet Take 500 mg by mouth as needed (Pain).   pantoprazole (PROTONIX) 40 MG tablet Take 1 tablet (40 mg total) by mouth daily.   predniSONE (DELTASONE) 20 MG tablet Take 2 tablets (40 mg total) by mouth daily with breakfast.   traMADol (ULTRAM) 50 MG tablet Take 1-2 tablets (50-100 mg total) by mouth daily as needed.   ursodiol (ACTIGALL) 300 MG capsule Take 2 capsules (600 mg total) by mouth 2 (two) times daily.   valACYclovir (VALTREX) 500 MG tablet Take 500 mg by mouth as needed (fever blisters).   Vitamin D, Ergocalciferol, (DRISDOL) 1.25 MG (50000 UNIT) CAPS capsule Take 50,000 Units by mouth every 7 (seven) days.   [DISCONTINUED]  diphenhydrAMINE (BENADRYL) 25 mg capsule Take 1  capsule (25 mg total) by mouth every 6 (six) hours as needed for itching.   Past Medical History:  Diagnosis Date   AMA + Autoimmune hepatitis (HCC) 08/10/2023   Anxiety    PP treated with counseling no meds   Fibromyalgia    GERD (gastroesophageal reflux disease)    History of palpitations    negative work-up by cardiologist-- dr Eden Emms-- 2015   Hypertension    taking labetalol   Lupus (systemic lupus erythematosus) (HCC)    MCTD (mixed connective tissue disease) (HCC)    dx 07/ 2015--  rheumotologist--  dr Zenovia Jordan   Nausea    Numbness and tingling    Wears contact lenses    Past Surgical History:  Procedure Laterality Date   DILATION AND EVACUATION N/A 06/17/2016   Procedure: DILATATION AND EVACUATION;  Surgeon: [name], DO;  Location: [facility];  Service: Gynecology;  Laterality: N/A;  WTIH SUCTION   WISDOM TOOTH EXTRACTION  2012   Social History   Social History Narrative   Lives at home with husband and children.   Right-handed.   Caffeine use:  5 cups per day.   family history includes Healthy in her brother, brother, daughter, and daughter; Hypertension in her father, maternal grandmother, and mother.   Review of Systems As per HPI  Objective:   Physical Exam BP (!) 142/86   Pulse 69   Ht 5\' 6"  (1.676 m)   Wt 198 lb (89.8 kg)   LMP 08/07/2023 (Exact Date)   SpO2 99%   BMI 31.96 kg/m  Physical Exam   HEENT: No thrush observed in the oral cavity. CHEST: Lungs clear to auscultation. CARDIOVASCULAR: Heart sounds normal. ABDOMEN: Slight abdominal tenderness noted upon palpation.

## 2023-09-08 ENCOUNTER — Ambulatory Visit: Payer: 59 | Admitting: Internal Medicine

## 2023-09-08 ENCOUNTER — Encounter: Payer: Self-pay | Admitting: Internal Medicine

## 2023-09-08 VITALS — BP 167/96 | HR 66 | Temp 98.0°F | Resp 11 | Ht 66.0 in | Wt 198.0 lb

## 2023-09-08 DIAGNOSIS — R1319 Other dysphagia: Secondary | ICD-10-CM

## 2023-09-08 DIAGNOSIS — K222 Esophageal obstruction: Secondary | ICD-10-CM | POA: Diagnosis present

## 2023-09-08 DIAGNOSIS — K319 Disease of stomach and duodenum, unspecified: Secondary | ICD-10-CM | POA: Diagnosis not present

## 2023-09-08 MED ORDER — SODIUM CHLORIDE 0.9 % IV SOLN: 500.0000 mL | INTRAVENOUS | Status: DC

## 2023-09-08 NOTE — Progress Notes (Signed)
Pt's states no medical or surgical changes since previsit or office visit. 

## 2023-09-08 NOTE — Patient Instructions (Addendum)
I dilated the esophagus - there was a mild stricture and took stomach biopsies (checking for an infection).  I will let you know results.  You do not need to do labs here as you saw Annamarie Major, NP sooner than I anticipated and she did the tests I was going to do.  I will be in touch about the biopsy results.  I appreciate the opportunity to care for you. Iva Boop, MD, [facility]   Follow dilatation diet given to you today  Await biopsy results  Continue present medications   YOU HAD AN ENDOSCOPIC PROCEDURE TODAY AT THE [facility]:   Refer to the procedure report that was given to you for any specific questions about what was found during the examination.  If the procedure report does not answer your questions, please call your gastroenterologist to clarify.  If you requested that your care partner not be given the details of your procedure findings, then the procedure report has been included in a sealed envelope for you to review at your convenience later.  YOU SHOULD EXPECT: Some feelings of bloating in the abdomen. Passage of more gas than usual.  Walking can help get rid of the air that was put into your GI tract during the procedure and reduce the bloating. If you had a lower endoscopy (such as a colonoscopy or flexible sigmoidoscopy) you may notice spotting of blood in your stool or on the toilet paper. If you underwent a bowel prep for your procedure, you may not have a normal bowel movement for a few days.  Please Note:  You might notice some irritation and congestion in your nose or some drainage.  This is from the oxygen used during your procedure.  There is no need for concern and it should clear up in a day or so.  SYMPTOMS TO REPORT IMMEDIATELY:    Following upper endoscopy (EGD)  Vomiting of blood or coffee ground material  New chest pain or pain under the shoulder blades  Painful or persistently difficult swallowing  New shortness of breath  Fever  of 100F or higher  Black, tarry-looking stools  For urgent or emergent issues, a gastroenterologist can be reached at any hour by calling [phone]. Do not use MyChart messaging for urgent concerns.    DIET:  Follow dilatation diet given to you today.We do recommend a small meal at first, but then you may proceed to your regular diet.  Drink plenty of fluids but you should avoid alcoholic beverages for 24 hours.  ACTIVITY:  You should plan to take it easy for the rest of today and you should NOT DRIVE or use heavy machinery until tomorrow (because of the sedation medicines used during the test).    FOLLOW UP: Our staff will call the number listed on your records the next business day following your procedure.  We will call around 7:15- 8:00 am to check on you and address any questions or concerns that you may have regarding the information given to you following your procedure. If we do not reach you, we will leave a message.     If any biopsies were taken you will be contacted by phone or by letter within the next 1-3 weeks.  Please call us at [phone] if you have not heard about the biopsies in 3 weeks.    SIGNATURES/CONFIDENTIALITY: You and/or your care partner have signed paperwork which will be entered into your electronic medical record.  These signatures  attest to the fact that that the information above on your After Visit Summary has been reviewed and is understood.  Full responsibility of the confidentiality of this discharge information lies with you and/or your care-partner.

## 2023-09-08 NOTE — Progress Notes (Signed)
To pacu, VSS. Report to [email or web address] 

## 2023-09-08 NOTE — Progress Notes (Signed)
History and Physical Interval Note:  09/08/2023 11:24 AM  Michelle Rojas  has presented today for endoscopic procedure(s), with the diagnosis of  Encounter Diagnosis  Name Primary?   Esophageal dysphagia Yes  .  The various methods of evaluation and treatment have been discussed with the patient and/or family. After consideration of risks, benefits and other options for treatment, the patient has consented to  the endoscopic procedure(s).   The patient's history has been reviewed, patient examined, no change in status, stable for endoscopic procedure(s).  I have reviewed the patient's chart and labs.  Questions were answered to the patient's satisfaction.     Iva Boop, MD, [facility]

## 2023-09-08 NOTE — Op Note (Signed)
[facility] Patient Name: Michelle Rojas Procedure Date: 09/08/2023 11:22 AM MRN: [id number] Endoscopist: Iva Boop , MD, [id number] Age: 42 Referring MD:  Date of Birth: 06-27-1981 Gender: Female Account #: [id number] Procedure:                Upper GI endoscopy Indications:              Dysphagia Medicines:                Monitored Anesthesia Care Procedure:                Pre-Anesthesia Assessment:                           - Prior to the procedure, a History and Physical                            was performed, and patient medications and                            allergies were reviewed. The patient's tolerance of                            previous anesthesia was also reviewed. The risks                            and benefits of the procedure and the sedation                            options and risks were discussed with the patient.                            All questions were answered, and informed consent                            was obtained. Prior Anticoagulants: The patient has                            taken no anticoagulant or antiplatelet agents. ASA                            Grade Assessment: II - A patient with mild systemic                            disease. After reviewing the risks and benefits,                            the patient was deemed in satisfactory condition to                            undergo the procedure.                           After obtaining informed consent, the endoscope was  passed under direct vision. Throughout the                            procedure, the patient's blood pressure, pulse, and                            oxygen saturations were monitored continuously. The                            Olympus Scope [id number] was introduced through the                            mouth, and advanced to the second part of duodenum.                            The upper GI endoscopy was  accomplished without                            difficulty. The patient tolerated the procedure                            well. Scope In: Scope Out: Findings:                 One benign-appearing, intrinsic moderate                            (circumferential scarring or stenosis; an endoscope                            may pass) stenosis was found at the                            gastroesophageal junction. The stenosis was                            traversed. A TTS dilator was passed through the                            scope. Dilation with an 18-19-20 mm balloon dilator                            was performed to 20 mm. The dilation site was                            examined and showed minimal mucosal disruption.                            Estimated blood loss was minimal.                           Patchy erythematous mucosa without bleeding was                            found in the gastric body and in  the gastric                            antrum. Biopsies were taken with a cold forceps for                            histology. Verification of patient identification                            for the specimen was done. Estimated blood loss was                            minimal.                           The exam was otherwise without abnormality.                           The cardia and gastric fundus were normal on                            retroflexion. Complications:            No immediate complications. Estimated Blood Loss:     Estimated blood loss was minimal. Impression:               - Benign-appearing esophageal stenosis. Dilated.                            Very mild stricture.                           - Erythematous mucosa in the gastric body and                            antrum. Biopsied.                           - The examination was otherwise normal. Recommendation:           - Patient has a contact number available for                             emergencies. The signs and symptoms of potential                            delayed complications were discussed with the                            patient. Return to normal activities tomorrow.                            Written discharge instructions were provided to the                            patient.                           -  Clear liquids x 1 hour then soft foods rest of                            day. Start prior diet tomorrow.                           - Continue present medications.                           - Await pathology results. cc Annamarie Major, NP by                            fax after pathology in Iva Boop, MD 09/08/2023 11:48:35 AM This report has been signed electronically.

## 2023-09-11 ENCOUNTER — Telehealth: Payer: Self-pay | Admitting: *Deleted

## 2023-09-11 NOTE — Telephone Encounter (Signed)
Left message on f/u call 

## 2023-09-12 ENCOUNTER — Encounter: Payer: Self-pay | Admitting: Internal Medicine

## 2023-09-12 LAB — SURGICAL PATHOLOGY

## 2023-09-20 ENCOUNTER — Other Ambulatory Visit: Payer: Self-pay | Admitting: Internal Medicine

## 2023-09-20 NOTE — Telephone Encounter (Signed)
Okay to approve Sir for the 90 day supply? Thank you.

## 2023-10-27 NOTE — Anesthesia Preprocedure Evaluation (Signed)
Anesthesia Evaluation    Airway        Dental   Pulmonary           Cardiovascular hypertension,      Neuro/Psych    GI/Hepatic   Endo/Other    Renal/GU      Musculoskeletal   Abdominal   Peds  Hematology   Anesthesia Other Findings   Reproductive/Obstetrics                             Anesthesia Physical Anesthesia Plan  ASA:   Anesthesia Plan:    Post-op Pain Management:    Induction:   PONV Risk Score and Plan:   Airway Management Planned:   Additional Equipment:   Intra-op Plan:   Post-operative Plan:   Informed Consent:   Plan Discussed with:   Anesthesia Plan Comments: (Chart reviewed with PAT nurse. Pt with recently recovering autoimmune hepatitis flare 2/2 medication. ALT remains elevated. Pt will need repeat labs, clearance by hepatologist and will need to be moved from outpatient setting to [facility] main OR.  Glade Stanford, MD)       Anesthesia Quick Evaluation

## 2023-10-27 NOTE — Progress Notes (Signed)
Reviewing chart for pre-op interview , surgery on 11-06-2023 by Dr Langston Masker , Abdominal Myomectomy.  Patient medical history lupus and diagnosis autoimmune hepatitis with hospitalization at [facility] with acute hepatitis.  Patient is being followed by hepatology clinic [facility] in [place] recent labs have been abnormal.  Will review with anesthesia if patient a candidate for ambulatory surgery center and if patient would need clearance prior to surgery. Chart reviewed with anesthesia, Dr R. Fitzgerald MDA.  Dr R. Sampson Goon has progress note in epic dated today 10-27-2023.  Recommends patient due to recently recovery autoimmune hepatitis flare due to mediations , ALT remains elevated.  Patient will need to moved to main OR from surgery center and needs clearance by hepatologist. Called and spoke w/ Corrie Dandy, [place] scheduler with Dr Langston Masker,  informed of anesthesia recommendation for patient to moved to main OR and why.

## 2023-10-29 ENCOUNTER — Other Ambulatory Visit: Payer: Self-pay

## 2023-10-29 ENCOUNTER — Emergency Department (HOSPITAL_COMMUNITY)
Admission: EM | Admit: 2023-10-29 | Discharge: 2023-10-29 | Disposition: A | Discharge status: Home / Self Care | Payer: 59 | Attending: Emergency Medicine | Admitting: Emergency Medicine

## 2023-10-29 DIAGNOSIS — R531 Weakness: Secondary | ICD-10-CM | POA: Diagnosis present

## 2023-10-29 DIAGNOSIS — Z79899 Other long term (current) drug therapy: Secondary | ICD-10-CM | POA: Diagnosis not present

## 2023-10-29 DIAGNOSIS — E1365 Other specified diabetes mellitus with hyperglycemia: Secondary | ICD-10-CM | POA: Insufficient documentation

## 2023-10-29 DIAGNOSIS — Z794 Long term (current) use of insulin: Secondary | ICD-10-CM | POA: Diagnosis not present

## 2023-10-29 DIAGNOSIS — R000 Tachycardia, unspecified: Secondary | ICD-10-CM | POA: Insufficient documentation

## 2023-10-29 DIAGNOSIS — D72829 Elevated white blood cell count, unspecified: Secondary | ICD-10-CM | POA: Diagnosis not present

## 2023-10-29 LAB — CBC
HCT: 39 % (ref 36.0–46.0)
Hemoglobin: 12.9 g/dL (ref 12.0–15.0)
MCH: 27.9 pg (ref 26.0–34.0)
MCHC: 33.1 g/dL (ref 30.0–36.0)
MCV: 84.2 fL (ref 80.0–100.0)
Platelets: 220 10*3/uL (ref 150–400)
RBC: 4.63 MIL/uL (ref 3.87–5.11)
RDW: 14.6 % (ref 11.5–15.5)
WBC: 14.7 10*3/uL — ABNORMAL HIGH (ref 4.0–10.5)
nRBC: 0 % (ref 0.0–0.2)

## 2023-10-29 LAB — BLOOD GAS, VENOUS
Acid-Base Excess: 5.5 mmol/L — ABNORMAL HIGH (ref 0.0–2.0)
Bicarbonate: 29.9 mmol/L — ABNORMAL HIGH (ref 20.0–28.0)
O2 Saturation: 94.3 %
Patient temperature: 37
pCO2, Ven: 42 mm[Hg] — ABNORMAL LOW (ref 44–60)
pH, Ven: 7.46 — ABNORMAL HIGH (ref 7.25–7.43)
pO2, Ven: 70 mm[Hg] — ABNORMAL HIGH (ref 32–45)

## 2023-10-29 LAB — COMPREHENSIVE METABOLIC PANEL
ALT: 112 U/L — ABNORMAL HIGH (ref 0–44)
AST: 26 U/L (ref 15–41)
Albumin: 3.4 g/dL — ABNORMAL LOW (ref 3.5–5.0)
Alkaline Phosphatase: 155 U/L — ABNORMAL HIGH (ref 38–126)
Anion gap: 11 (ref 5–15)
BUN: 29 mg/dL — ABNORMAL HIGH (ref 6–20)
CO2: 26 mmol/L (ref 22–32)
Calcium: 9.3 mg/dL (ref 8.9–10.3)
Chloride: 102 mmol/L (ref 98–111)
Creatinine, Ser: 1.13 mg/dL — ABNORMAL HIGH (ref 0.44–1.00)
GFR, Estimated: >60 mL/min (ref >60)
Glucose, Bld: 568 mg/dL (ref 70–99)
Potassium: 4.5 mmol/L (ref 3.5–5.1)
Sodium: 139 mmol/L (ref 135–145)
Total Bilirubin: 0.7 mg/dL (ref <1.2)
Total Protein: 6.9 g/dL (ref 6.5–8.1)

## 2023-10-29 LAB — URINALYSIS, ROUTINE W REFLEX MICROSCOPIC
Bilirubin Urine: NEGATIVE
Glucose, UA: >=500 mg/dL — AB
Ketones, ur: NEGATIVE mg/dL
Leukocytes,Ua: NEGATIVE
Nitrite: NEGATIVE
Protein, ur: NEGATIVE mg/dL
RBC / HPF: >50 RBC/hpf (ref 0–5)
Specific Gravity, Urine: 1.032 — ABNORMAL HIGH (ref 1.005–1.030)
pH: 5 (ref 5.0–8.0)

## 2023-10-29 LAB — CBG MONITORING, ED
Glucose-Capillary: 470 mg/dL — ABNORMAL HIGH (ref 70–99)
Glucose-Capillary: 296 mg/dL — ABNORMAL HIGH (ref 70–99)
Glucose-Capillary: 252 mg/dL — ABNORMAL HIGH (ref 70–99)

## 2023-10-29 LAB — HCG, SERUM, QUALITATIVE: Preg, Serum: NEGATIVE

## 2023-10-29 LAB — BETA-HYDROXYBUTYRIC ACID: Beta-Hydroxybutyric Acid: 0.16 mmol/L (ref 0.05–0.27)

## 2023-10-29 MED ORDER — LACTATED RINGERS IV BOLUS: 20.0000 mL/kg | Freq: Once | INTRAVENOUS | Status: DC

## 2023-10-29 MED ORDER — SODIUM CHLORIDE 0.9 % IV BOLUS
1000.0000 mL | Freq: Once | INTRAVENOUS | Status: AC
  Administered 2023-10-29: 1000 mL via INTRAVENOUS

## 2023-10-29 MED ORDER — INSULIN ASPART 100 UNIT/ML IJ SOLN
10.0000 [IU] | Freq: Once | INTRAMUSCULAR | Status: AC
  Administered 2023-10-29: 10 [IU] via INTRAVENOUS
  Filled 2023-10-29: qty 0.1

## 2023-10-29 NOTE — ED Notes (Signed)
[facility] 

## 2023-10-29 NOTE — Discharge Instructions (Signed)
You are seen in the emergency room today for high blood sugar.  Please increase your long-acting from 35 units to 40 units.  Please make sure you are staying well-hydrated with water.  Call primary care to discuss follow-up for your hyperglycemia as well as referral to endocrinology.  Please return to emergency room with any new or worsening symptoms.

## 2023-10-29 NOTE — ED Provider Notes (Signed)
[place] EMERGENCY DEPARTMENT AT [facility] Provider Note   CSN: [id number] Arrival date & time: 10/29/23  1752     History {Add pertinent medical, surgical, social history, OB history to HPI:1} Chief Complaint  Patient presents with   Weakness   Polyuria   Hyperglycemia    BERDINE MCCLARAN is a 42 y.o. female patient with past medical history of autoimmune hepatitis, lupus, fibromyalgia, steroid-induced diabetes presenting to emergency room with hyperglycemia.  Patient reports that for the past week she has had increased thirst frequent urination feeling unwell and having blood pressure readings averaging 400 and frequently getting readings as "high." Reports since she has been on high dose steriods she has had a very difficult time controlling her blood glucose and also has had blurry vision that she feels is slowly worsening. Normally wears glasses.  Denies any recent upper respiratory tract infection, chest pain shortness of breath no abdominal pain nausea vomiting or diarrhea.   Weakness Hyperglycemia Associated symptoms: weakness        Home Medications Prior to Admission medications   Medication Sig Start Date End Date Taking? Authorizing Provider  amLODipine (NORVASC) 10 MG tablet Take 10 mg by mouth daily. 08/08/23   [provider]  BENLYSTA 200 MG/ML SOAJ once a week. 02/03/19   [provider]  diclofenac sodium (VOLTAREN) 1 % GEL Apply 4 g topically 4 (four) times daily. Patient taking differently: Apply 4 g topically as needed. 09/17/18   Levert Feinstein, MD  famotidine (PEPCID) 20 MG tablet Take 1 tablet (20 mg total) by mouth 2 (two) times daily. 08/17/23   Arnetha Courser, MD  fluticasone (FLONASE) 50 MCG/ACT nasal spray Place 1 spray into both nostrils daily. 05/30/23   [provider]  hydrOXYzine (ATARAX) 25 MG tablet TAKE ONE TO TWO, AT BEDTIME AS NEEDED FOR ITCHING. 09/20/23   Iva Boop, MD  ibuprofen (ADVIL,MOTRIN) 600  MG tablet Take 1 tablet (600 mg total) every 6 (six) hours by mouth. Patient taking differently: Take 600 mg by mouth as needed. 09/25/17   Candice Camp, MD  meclizine (ANTIVERT) 25 MG tablet Take 1 tablet (25 mg total) by mouth 3 (three) times daily as needed for dizziness. 12/13/21   Hyman Hopes, Margaux, PA-C  naproxen (NAPROSYN) 500 MG tablet Take 500 mg by mouth as needed (Pain).    [provider]  pantoprazole (PROTONIX) 40 MG tablet Take 1 tablet (40 mg total) by mouth daily. 08/18/23   Arnetha Courser, MD  predniSONE (DELTASONE) 20 MG tablet Take 2 tablets (40 mg total) by mouth daily with breakfast. 08/17/23   Arnetha Courser, MD  traMADol (ULTRAM) 50 MG tablet Take 1-2 tablets (50-100 mg total) by mouth daily as needed. 11/06/18   Tarry Kos, MD  ursodiol (ACTIGALL) 300 MG capsule Take 2 capsules (600 mg total) by mouth 2 (two) times daily. 08/29/23   Iva Boop, MD  valACYclovir (VALTREX) 500 MG tablet Take 500 mg by mouth as needed (fever blisters).    [provider]  Vitamin D, Ergocalciferol, (DRISDOL) 1.25 MG (50000 UNIT) CAPS capsule Take 50,000 Units by mouth every 7 (seven) days. 03/01/21   [provider]      Allergies    Patient has no known allergies.    Review of Systems   Review of Systems  Neurological:  Positive for weakness.    Physical Exam Updated Vital Signs BP (!) 159/93 (BP Location: Right Arm)   Pulse (!) 118  Temp 98.4 F (36.9 C) (Oral)   Resp 18   Ht 5\' 6"  (1.676 m)   Wt 90.7 kg   SpO2 99%   BMI 32.28 kg/m  Physical Exam Vitals and nursing note reviewed.  Constitutional:      General: She is not in acute distress.    Appearance: She is not toxic-appearing.  HENT:     Head: Normocephalic and atraumatic.  Eyes:     General: No scleral icterus.    Conjunctiva/sclera: Conjunctivae normal.  Cardiovascular:     Rate and Rhythm: Regular rhythm. Tachycardia present.     Pulses: Normal pulses.     Heart sounds: Normal heart  sounds.  Pulmonary:     Effort: Pulmonary effort is normal. No respiratory distress.     Breath sounds: Normal breath sounds.  Abdominal:     General: Abdomen is flat. Bowel sounds are normal.     Palpations: Abdomen is soft.     Tenderness: There is no abdominal tenderness.  Skin:    General: Skin is warm and dry.     Capillary Refill: Capillary refill takes 2 to 3 seconds.     Findings: No lesion.     Comments: Appears dry  Neurological:     General: No focal deficit present.     Mental Status: She is alert and oriented to person, place, and time. Mental status is at baseline.     ED Results / Procedures / Treatments   Labs (all labs ordered are listed, but only abnormal results are displayed) Labs Reviewed  CBG MONITORING, ED - Abnormal; Notable for the following components:      Result Value   Glucose-Capillary 470 (*)    All other components within normal limits  CBC  URINALYSIS, ROUTINE W REFLEX MICROSCOPIC  HCG, SERUM, QUALITATIVE  COMPREHENSIVE METABOLIC PANEL  BETA-HYDROXYBUTYRIC ACID  BETA-HYDROXYBUTYRIC ACID  BLOOD GAS, VENOUS    EKG None  Radiology No results found.  Procedures Procedures  {Document cardiac monitor, telemetry assessment procedure when appropriate:1}  Medications Ordered in ED Medications  sodium chloride 0.9 % bolus 1,000 mL (has no administration in time range)  sodium chloride 0.9 % bolus 1,000 mL (has no administration in time range)    ED Course/ Medical Decision Making/ A&P   {   Click here for ABCD2, HEART and other calculatorsREFRESH Note before signing :1}                              Medical Decision Making Amount and/or Complexity of Data Reviewed Labs: ordered.   Xaniyah A Mahaffy 42 y.o. presented today for abd pain. Working DDx includes, but not limited to, gastroenteritis, colitis, SBO, appendicitis, cholecystitis, hepatobiliary pathology, gastritis, PUD, ACS, dissection, pancreatitis, nephrolithiasis, AAA, UTI,  pyelonephritis, ***ruptured ectopic pregnancy, PID, ovarian/***testicular torsion.  R/o DDx: These are considered less likely than current impression due to history of present illness, physical exam, labs/imaging findings.  Review of prior external notes: ***  Pmhx:   Unique Tests and My Interpretation:  CBC CMP Beta hydroxybutyric acid UA hCG Continuous monitoring BG 470 Blood glass with metabolic alkalosis  Imaging:  Considered but not obtained, patient has intermittent generalized abdominal pain.  No tenderness on palpation.  Problem List / ED Course / Critical interventions / Medication management  *** I ordered medication including ***  for ***  Reevaluation of the patient after these medicines showed that the patient {resolved/improved/worsened:23923::"improved"} Patients vitals assessed.  Upon arrival patient is hemodynamically stable, slighty tachy improved after fluids  I have reviewed the patients home medicines and have made adjustments as needed   Consult: ***  Plan:  F/u w/ PCP in 2-3d to ensure resolution of sx.  Patient was given return precautions. Patient stable for discharge at this time.  Patient educated on current sx/dx and verbalized understanding of plan. Return to ER w/ new or worsening sx.    {Document critical care time when appropriate:1} {Document review of labs and clinical decision tools ie heart score, Chads2Vasc2 etc:1}  {Document your independent review of radiology images, and any outside records:1} {Document your discussion with family members, caretakers, and with consultants:1} {Document social determinants of health affecting pt's care:1} {Document your decision making why or why not admission, treatments were needed:1} Final Clinical Impression(s) / ED Diagnoses Final diagnoses:  None    Rx / DC Orders ED Discharge Orders     None

## 2023-10-29 NOTE — ED Triage Notes (Signed)
Pt arrived via POV. Recently dx with DM. C/o weakness, polyuria, and polydipsia. CBG has been reading HIGH at home.  AOx4

## 2023-11-06 DIAGNOSIS — D219 Benign neoplasm of connective and other soft tissue, unspecified: Secondary | ICD-10-CM

## 2023-11-13 ENCOUNTER — Encounter: Payer: Self-pay | Admitting: Nurse Practitioner

## 2023-11-13 DIAGNOSIS — D849 Immunodeficiency, unspecified: Secondary | ICD-10-CM

## 2023-11-13 DIAGNOSIS — M79605 Pain in left leg: Secondary | ICD-10-CM

## 2023-11-13 DIAGNOSIS — M87052 Idiopathic aseptic necrosis of left femur: Secondary | ICD-10-CM

## 2023-11-18 ENCOUNTER — Other Ambulatory Visit: Payer: Self-pay

## 2023-11-18 ENCOUNTER — Emergency Department (HOSPITAL_COMMUNITY)
Admission: EM | Admit: 2023-11-18 | Discharge: 2023-11-18 | Disposition: A | Discharge status: Home / Self Care | Payer: 59 | Attending: Emergency Medicine | Admitting: Emergency Medicine

## 2023-11-18 ENCOUNTER — Encounter (HOSPITAL_COMMUNITY): Payer: Self-pay

## 2023-11-18 DIAGNOSIS — L02211 Cutaneous abscess of abdominal wall: Secondary | ICD-10-CM | POA: Insufficient documentation

## 2023-11-18 DIAGNOSIS — L0291 Cutaneous abscess, unspecified: Secondary | ICD-10-CM

## 2023-11-18 MED ORDER — OXYCODONE-ACETAMINOPHEN 5-325 MG PO TABS
1.0000 | ORAL_TABLET | Freq: Once | ORAL | Status: AC
  Administered 2023-11-18: 1 via ORAL
  Filled 2023-11-18: qty 1

## 2023-11-18 MED ORDER — LIDOCAINE HCL (PF) 1 % IJ SOLN
5.0000 mL | Freq: Once | INTRAMUSCULAR | Status: AC
  Administered 2023-11-18: 5 mL
  Filled 2023-11-18: qty 30

## 2023-11-18 MED ORDER — SULFAMETHOXAZOLE-TRIMETHOPRIM 800-160 MG PO TABS
1.0000 | ORAL_TABLET | Freq: Once | ORAL | Status: AC
  Administered 2023-11-18: 1 via ORAL
  Filled 2023-11-18: qty 1

## 2023-11-18 MED ORDER — SULFAMETHOXAZOLE-TRIMETHOPRIM 800-160 MG PO TABS: 1.0000 | ORAL_TABLET | Freq: Two times a day (BID) | ORAL | 0 refills | Status: AC

## 2023-11-18 NOTE — Discharge Instructions (Signed)
You were seen in the emergency department for your groin pain and swelling.  You did have an abscess that we were able to drain in the emergency department.  The skin around the abscess did not appear infected as well so I have given you a course of antibiotics he should complete this as prescribed.  You should continue to do warm compresses for 15 to 20 minutes at a time few times a day to help continue to encourage drainage and should follow-up with your primary doctor as scheduled on Monday to have the wound rechecked.  You should return to the emergency department if you have fevers despite the antibiotics, increasing pain or swelling or any other new or concerning symptoms.

## 2023-11-18 NOTE — ED Triage Notes (Signed)
Pt presents with a mass in her L groin that is painful to touch. Pt denies fevers, N/V.

## 2023-11-18 NOTE — ED Provider Notes (Signed)
[place] EMERGENCY DEPARTMENT AT [facility] Provider Note   CSN: [id number] Arrival date & time: 11/18/23  1300     History  Chief Complaint  Patient presents with   Mass    Michelle Rojas is a 42 y.o. female.  Patient is a 42 year old female with a past medical history of lupus and autoimmune hepatitis presenting to the emergency department with groin pain and swelling.  The patient states that she has had some swelling in her suprapubic area since Thursday.  She states that she made an appointment with her primary doctor for Monday however over the last 2 days her pain has significantly worsened.  She states that she has not had any drainage.  Denies any fevers, nausea or vomiting.  The history is provided by the patient.       Home Medications Prior to Admission medications   Medication Sig Start Date End Date Taking? Authorizing Provider  sulfamethoxazole-trimethoprim (BACTRIM DS) 800-160 MG tablet Take 1 tablet by mouth 2 (two) times daily for 7 days. 11/18/23 11/25/23 Yes Kingsley, Benetta Spar K, DO  amLODipine (NORVASC) 10 MG tablet Take 10 mg by mouth daily. 08/08/23   [provider]  BENLYSTA 200 MG/ML SOAJ once a week. 02/03/19   [provider]  diclofenac sodium (VOLTAREN) 1 % GEL Apply 4 g topically 4 (four) times daily. Patient taking differently: Apply 4 g topically as needed. 09/17/18   Levert Feinstein, MD  famotidine (PEPCID) 20 MG tablet Take 1 tablet (20 mg total) by mouth 2 (two) times daily. 08/17/23   Arnetha Courser, MD  fluticasone (FLONASE) 50 MCG/ACT nasal spray Place 1 spray into both nostrils daily. 05/30/23   [provider]  hydrOXYzine (ATARAX) 25 MG tablet TAKE ONE TO TWO, AT BEDTIME AS NEEDED FOR ITCHING. 09/20/23   Iva Boop, MD  ibuprofen (ADVIL,MOTRIN) 600 MG tablet Take 1 tablet (600 mg total) every 6 (six) hours by mouth. Patient taking differently: Take 600 mg by mouth as needed. 09/25/17   Candice Camp, MD   meclizine (ANTIVERT) 25 MG tablet Take 1 tablet (25 mg total) by mouth 3 (three) times daily as needed for dizziness. 12/13/21   Hyman Hopes, Margaux, PA-C  naproxen (NAPROSYN) 500 MG tablet Take 500 mg by mouth as needed (Pain).    [provider]  pantoprazole (PROTONIX) 40 MG tablet Take 1 tablet (40 mg total) by mouth daily. 08/18/23   Arnetha Courser, MD  predniSONE (DELTASONE) 20 MG tablet Take 2 tablets (40 mg total) by mouth daily with breakfast. 08/17/23   Arnetha Courser, MD  traMADol (ULTRAM) 50 MG tablet Take 1-2 tablets (50-100 mg total) by mouth daily as needed. 11/06/18   Tarry Kos, MD  ursodiol (ACTIGALL) 300 MG capsule Take 2 capsules (600 mg total) by mouth 2 (two) times daily. 08/29/23   Iva Boop, MD  valACYclovir (VALTREX) 500 MG tablet Take 500 mg by mouth as needed (fever blisters).    [provider]  Vitamin D, Ergocalciferol, (DRISDOL) 1.25 MG (50000 UNIT) CAPS capsule Take 50,000 Units by mouth every 7 (seven) days. 03/01/21   [provider]      Allergies    Patient has no known allergies.    Review of Systems   Review of Systems  Physical Exam Updated Vital Signs BP (!) 149/96 (BP Location: Left Arm)   Pulse (!) 112   Temp 99.4 F (37.4 C) (Oral)   Resp 16   Ht 5\' 6"  (  1.676 m)   Wt 88.5 kg   LMP 10/16/2023   SpO2 100%   BMI 31.47 kg/m  Physical Exam Vitals and nursing note reviewed.  Constitutional:      General: She is not in acute distress.    Appearance: Normal appearance.  HENT:     Head: Normocephalic and atraumatic.     Nose: Nose normal.     Mouth/Throat:     Mouth: Mucous membranes are moist.  Eyes:     Extraocular Movements: Extraocular movements intact.  Cardiovascular:     Rate and Rhythm: Regular rhythm. Tachycardia present.     Heart sounds: Normal heart sounds.  Pulmonary:     Effort: Pulmonary effort is normal.     Breath sounds: Normal breath sounds.  Abdominal:     General: Abdomen is flat.      Palpations: Abdomen is soft.     Tenderness: There is no abdominal tenderness.  Musculoskeletal:        General: Normal range of motion.     Cervical back: Normal range of motion.  Skin:    General: Skin is warm and dry.     Comments: ~2cm round nodule palpable on mons pubis, mild erythema, no warmth, no drainage  Neurological:     General: No focal deficit present.     Mental Status: She is alert and oriented to person, place, and time.  Psychiatric:        Mood and Affect: Mood normal.        Behavior: Behavior normal.     ED Results / Procedures / Treatments   Labs (all labs ordered are listed, but only abnormal results are displayed) Labs Reviewed - No data to display  EKG None  Radiology No results found.  Procedures .Ultrasound ED Soft Tissue  Date/Time: 11/18/2023 3:34 PM  Performed by: Rexford Maus, DO Authorized by: Rexford Maus, DO   Procedure details:    Indications: localization of abscess     Transverse view:  Visualized   Longitudinal view:  Visualized   Images: archived   Location:    Location: pelvic wall     Side:  Left Findings:     abscess present Comments:     ~1 cm fluid collection, no color flow .Incision and Drainage  Date/Time: 11/18/2023 4:04 PM  Performed by: Rexford Maus, DO Authorized by: Rexford Maus, DO   Consent:    Consent obtained:  Verbal   Consent given by:  Patient   Risks discussed:  Bleeding, incomplete drainage and pain   Alternatives discussed:  No treatment and delayed treatment Universal protocol:    Procedure explained and questions answered to patient or proxy's satisfaction: yes     Patient identity confirmed:  Verbally with patient Location:    Type:  Abscess   Size:  1x1.5 cm   Location:  Trunk   Trunk location:  Abdomen (Suprapubic) Pre-procedure details:    Skin preparation:  Chlorhexidine with alcohol Sedation:    Sedation type:  None Anesthesia:    Anesthesia  method:  Local infiltration   Local anesthetic:  Lidocaine 1% w/o epi Procedure type:    Complexity:  Simple Procedure details:    Ultrasound guidance: yes     Needle aspiration: yes     Needle size:  18 G   Incision types:  Stab incision   Incision depth:  Dermal   Wound management:  Probed and deloculated   Drainage:  Purulent  Drainage amount:  Moderate   Wound treatment:  Wound left open   Packing materials:  None Post-procedure details:    Procedure completion:  Tolerated well, no immediate complications     Medications Ordered in ED Medications  lidocaine (PF) (XYLOCAINE) 1 % injection 5 mL (has no administration in time range)  sulfamethoxazole-trimethoprim (BACTRIM DS) 800-160 MG per tablet 1 tablet (has no administration in time range)  oxyCODONE-acetaminophen (PERCOCET/ROXICET) 5-325 MG per tablet 1 tablet (1 tablet Oral Given 11/18/23 1528)    ED Course/ Medical Decision Making/ A&P Clinical Course as of 11/18/23 1607  Sat Nov 18, 2023  1559 Successful I&D performed. Patient does have surrounding erythema and induration and will be started on antibiotics. Recommended outpatient follow up on Monday as scheduled. [VK]    Clinical Course User Index [VK] Rexford Maus, DO                                 Medical Decision Making This patient presents to the ED with chief complaint(s) of groin pain/swelling with pertinent past medical history of lupus, autoimmune hepatitis which further complicates the presenting complaint. The complaint involves an extensive differential diagnosis and also carries with it a high risk of complications and morbidity.    The differential diagnosis includes abscess, cellulitis, lymphadenopathy, cyst  Additional history obtained: Additional history obtained from N/A Records reviewed Care Everywhere/External Records  ED Course and Reassessment: On patient's arrival she is tachycardic but otherwise hemodynamically stable in no  acute distress.  Patient does have a round tender nodule in her suprapubic region.  Bedside ultrasound was performed that did show fluid collection without color-flow making lymphadenopathy unlikely.  Fluid collection is nearly 1 cm deep and will perform needle aspiration prior to I&D.  She was given pain control.  Independent labs interpretation:  N/A  Independent visualization of imaging: - N/A  Consultation: - Consulted or discussed management/test interpretation w/ external professional: N/A  Consideration for admission or further workup: Patient has no emergent conditions requiring admission or further work-up at this time and is stable for discharge home with primary care follow-up  Social Determinants of health: N/A    Risk Prescription drug management.          Final Clinical Impression(s) / ED Diagnoses Final diagnoses:  Abscess    Rx / DC Orders ED Discharge Orders          Ordered    sulfamethoxazole-trimethoprim (BACTRIM DS) 800-160 MG tablet  2 times daily        11/18/23 1604              Rexford Maus, DO 11/18/23 1607

## 2023-11-27 ENCOUNTER — Emergency Department (HOSPITAL_COMMUNITY): Payer: 59

## 2023-11-27 ENCOUNTER — Other Ambulatory Visit: Payer: Self-pay

## 2023-11-27 ENCOUNTER — Inpatient Hospital Stay (HOSPITAL_COMMUNITY)
Admission: EM | Admit: 2023-11-27 | Discharge: 2023-11-30 | DRG: 746 | Disposition: A | Discharge status: Home / Self Care | Payer: 59 | Attending: Internal Medicine | Admitting: Internal Medicine

## 2023-11-27 DIAGNOSIS — N764 Abscess of vulva: Principal | ICD-10-CM | POA: Diagnosis present

## 2023-11-27 DIAGNOSIS — K219 Gastro-esophageal reflux disease without esophagitis: Secondary | ICD-10-CM | POA: Diagnosis present

## 2023-11-27 DIAGNOSIS — Z794 Long term (current) use of insulin: Secondary | ICD-10-CM

## 2023-11-27 DIAGNOSIS — N762 Acute vulvitis: Secondary | ICD-10-CM | POA: Diagnosis present

## 2023-11-27 DIAGNOSIS — E119 Type 2 diabetes mellitus without complications: Principal | ICD-10-CM

## 2023-11-27 DIAGNOSIS — M329 Systemic lupus erythematosus, unspecified: Secondary | ICD-10-CM | POA: Diagnosis present

## 2023-11-27 DIAGNOSIS — Z6833 Body mass index (BMI) 33.0-33.9, adult: Secondary | ICD-10-CM

## 2023-11-27 DIAGNOSIS — Z7952 Long term (current) use of systemic steroids: Secondary | ICD-10-CM

## 2023-11-27 DIAGNOSIS — T380X5A Adverse effect of glucocorticoids and synthetic analogues, initial encounter: Secondary | ICD-10-CM | POA: Diagnosis present

## 2023-11-27 DIAGNOSIS — E669 Obesity, unspecified: Secondary | ICD-10-CM | POA: Diagnosis present

## 2023-11-27 DIAGNOSIS — E1165 Type 2 diabetes mellitus with hyperglycemia: Secondary | ICD-10-CM | POA: Diagnosis present

## 2023-11-27 DIAGNOSIS — D259 Leiomyoma of uterus, unspecified: Secondary | ICD-10-CM | POA: Diagnosis present

## 2023-11-27 DIAGNOSIS — F419 Anxiety disorder, unspecified: Secondary | ICD-10-CM | POA: Diagnosis present

## 2023-11-27 DIAGNOSIS — I10 Essential (primary) hypertension: Secondary | ICD-10-CM | POA: Diagnosis present

## 2023-11-27 DIAGNOSIS — Z8249 Family history of ischemic heart disease and other diseases of the circulatory system: Secondary | ICD-10-CM

## 2023-11-27 DIAGNOSIS — Z79621 Long term (current) use of calcineurin inhibitor: Secondary | ICD-10-CM

## 2023-11-27 DIAGNOSIS — G8929 Other chronic pain: Secondary | ICD-10-CM | POA: Diagnosis present

## 2023-11-27 DIAGNOSIS — M351 Other overlap syndromes: Secondary | ICD-10-CM | POA: Diagnosis present

## 2023-11-27 DIAGNOSIS — Z79899 Other long term (current) drug therapy: Secondary | ICD-10-CM

## 2023-11-27 DIAGNOSIS — L0291 Cutaneous abscess, unspecified: Principal | ICD-10-CM

## 2023-11-27 DIAGNOSIS — K754 Autoimmune hepatitis: Secondary | ICD-10-CM | POA: Diagnosis present

## 2023-11-27 DIAGNOSIS — L0390 Cellulitis, unspecified: Secondary | ICD-10-CM

## 2023-11-27 DIAGNOSIS — M797 Fibromyalgia: Secondary | ICD-10-CM | POA: Diagnosis present

## 2023-11-27 DIAGNOSIS — E66811 Obesity, class 1: Secondary | ICD-10-CM | POA: Diagnosis present

## 2023-11-27 DIAGNOSIS — D84821 Immunodeficiency due to drugs: Secondary | ICD-10-CM | POA: Diagnosis present

## 2023-11-27 LAB — COMPREHENSIVE METABOLIC PANEL
ALT: 30 U/L (ref 0–44)
AST: 14 U/L — ABNORMAL LOW (ref 15–41)
Albumin: 3.2 g/dL — ABNORMAL LOW (ref 3.5–5.0)
Alkaline Phosphatase: 90 U/L (ref 38–126)
Anion gap: 8 (ref 5–15)
BUN: 11 mg/dL (ref 6–20)
CO2: 25 mmol/L (ref 22–32)
Calcium: 9 mg/dL (ref 8.9–10.3)
Chloride: 104 mmol/L (ref 98–111)
Creatinine, Ser: 0.65 mg/dL (ref 0.44–1.00)
GFR, Estimated: >60 mL/min (ref >60)
Glucose, Bld: 236 mg/dL — ABNORMAL HIGH (ref 70–99)
Potassium: 3.8 mmol/L (ref 3.5–5.1)
Sodium: 137 mmol/L (ref 135–145)
Total Bilirubin: 0.4 mg/dL (ref 0.0–1.2)
Total Protein: 6.7 g/dL (ref 6.5–8.1)

## 2023-11-27 LAB — CBC WITH DIFFERENTIAL/PLATELET
Abs Immature Granulocytes: 0.64 10*3/uL — ABNORMAL HIGH (ref 0.00–0.07)
Basophils Absolute: 0.1 10*3/uL (ref 0.0–0.1)
Basophils Relative: 1 %
Eosinophils Absolute: 0 10*3/uL (ref 0.0–0.5)
Eosinophils Relative: 0 %
HCT: 34.4 % — ABNORMAL LOW (ref 36.0–46.0)
Hemoglobin: 11.1 g/dL — ABNORMAL LOW (ref 12.0–15.0)
Immature Granulocytes: 6 %
Lymphocytes Relative: 30 %
Lymphs Abs: 3.4 10*3/uL (ref 0.7–4.0)
MCH: 27.5 pg (ref 26.0–34.0)
MCHC: 32.3 g/dL (ref 30.0–36.0)
MCV: 85.1 fL (ref 80.0–100.0)
Monocytes Absolute: 0.8 10*3/uL (ref 0.1–1.0)
Monocytes Relative: 7 %
Neutro Abs: 6.5 10*3/uL (ref 1.7–7.7)
Neutrophils Relative %: 56 %
Platelet Morphology: NORMAL
Platelets: 321 10*3/uL (ref 150–400)
RBC: 4.04 MIL/uL (ref 3.87–5.11)
RDW: 14.6 % (ref 11.5–15.5)
WBC: 11.4 10*3/uL — ABNORMAL HIGH (ref 4.0–10.5)
nRBC: 0.6 % — ABNORMAL HIGH (ref 0.0–0.2)

## 2023-11-27 LAB — I-STAT CG4 LACTIC ACID, ED
Lactic Acid, Venous: 0.6 mmol/L (ref 0.5–1.9)
Lactic Acid, Venous: 0.9 mmol/L (ref 0.5–1.9)

## 2023-11-27 LAB — C-REACTIVE PROTEIN: CRP: 1.5 mg/dL — ABNORMAL HIGH (ref <1.0)

## 2023-11-27 MED ORDER — IOHEXOL 300 MG/ML  SOLN
100.0000 mL | Freq: Once | INTRAMUSCULAR | Status: AC | PRN
  Administered 2023-11-27: 100 mL via INTRAVENOUS

## 2023-11-27 MED ORDER — METRONIDAZOLE 500 MG/100ML IV SOLN
500.0000 mg | Freq: Once | INTRAVENOUS | Status: AC
Start: 2023-11-27 — End: 2023-11-28
  Administered 2023-11-28: 500 mg via INTRAVENOUS
  Filled 2023-11-27: qty 100

## 2023-11-27 MED ORDER — SODIUM CHLORIDE 0.9 % IV SOLN
2.0000 g | Freq: Once | INTRAVENOUS | Status: AC
  Administered 2023-11-27: 2 g via INTRAVENOUS
  Filled 2023-11-27: qty 12.5

## 2023-11-27 MED ORDER — VANCOMYCIN HCL IN DEXTROSE 1-5 GM/200ML-% IV SOLN: 1000.0000 mg | Freq: Once | INTRAVENOUS | Status: DC

## 2023-11-27 MED ORDER — HYDROCODONE-ACETAMINOPHEN 5-325 MG PO TABS
1.0000 | ORAL_TABLET | Freq: Once | ORAL | Status: AC
  Administered 2023-11-27: 1 via ORAL
  Filled 2023-11-27: qty 1

## 2023-11-27 MED ORDER — VANCOMYCIN HCL 2000 MG/400ML IV SOLN
2000.0000 mg | Freq: Once | INTRAVENOUS | Status: AC
  Administered 2023-11-27: 2000 mg via INTRAVENOUS
  Filled 2023-11-27: qty 400

## 2023-11-27 NOTE — ED Provider Notes (Signed)
 [place] EMERGENCY DEPARTMENT AT [facility] Provider Note   CSN: [id number] Arrival date & time: 11/27/23  1152     History  Chief Complaint  Patient presents with   Abscess    Michelle Rojas is a 43 y.o. female presents today for a left groin abscess that she had I&D at approximately 1 week ago.  Patient finished 7 days of Bactrim  approximately 2 days ago.  Patient states that the abscess has begun draining on its own since having it I&D and is still having pain and swelling.  Patient also endorses chills.  Patient denies nausea, vomiting, fever, chest pain, or shortness of breath.   Abscess      Home Medications Prior to Admission medications   Medication Sig Start Date End Date Taking? Authorizing Provider  amLODipine  (NORVASC ) 10 MG tablet Take 10 mg by mouth daily. 08/08/23   [provider]  BENLYSTA 200 MG/ML SOAJ once a week. 02/03/19   [provider]  diclofenac  sodium (VOLTAREN ) 1 % GEL Apply 4 g topically 4 (four) times daily. Patient taking differently: Apply 4 g topically as needed. 09/17/18   Onita Duos, MD  famotidine  (PEPCID ) 20 MG tablet Take 1 tablet (20 mg total) by mouth 2 (two) times daily. 08/17/23   Caleen Qualia, MD  fluticasone  (FLONASE ) 50 MCG/ACT nasal spray Place 1 spray into both nostrils daily. 05/30/23   [provider]  hydrOXYzine  (ATARAX ) 25 MG tablet TAKE ONE TO TWO, AT BEDTIME AS NEEDED FOR ITCHING. 09/20/23   Avram Lupita BRAVO, MD  ibuprofen  (ADVIL ,MOTRIN ) 600 MG tablet Take 1 tablet (600 mg total) every 6 (six) hours by mouth. Patient taking differently: Take 600 mg by mouth as needed. 09/25/17   Marget Lenis, MD  meclizine  (ANTIVERT ) 25 MG tablet Take 1 tablet (25 mg total) by mouth 3 (three) times daily as needed for dizziness. 12/13/21   Shepard, Margaux, PA-C  naproxen (NAPROSYN) 500 MG tablet Take 500 mg by mouth as needed (Pain).    [provider]  pantoprazole  (PROTONIX ) 40 MG tablet Take 1  tablet (40 mg total) by mouth daily. 08/18/23   Amin, Sumayya, MD  predniSONE  (DELTASONE ) 20 MG tablet Take 2 tablets (40 mg total) by mouth daily with breakfast. 08/17/23   Caleen Qualia, MD  traMADol  (ULTRAM ) 50 MG tablet Take 1-2 tablets (50-100 mg total) by mouth daily as needed. 11/06/18   Jerri Kay HERO, MD  ursodiol  (ACTIGALL ) 300 MG capsule Take 2 capsules (600 mg total) by mouth 2 (two) times daily. 08/29/23   Avram Lupita BRAVO, MD  valACYclovir (VALTREX) 500 MG tablet Take 500 mg by mouth as needed (fever blisters).    [provider]  Vitamin D, Ergocalciferol, (DRISDOL) 1.25 MG (50000 UNIT) CAPS capsule Take 50,000 Units by mouth every 7 (seven) days. 03/01/21   [provider]      Allergies    Patient has no known allergies.    Review of Systems   Review of Systems  Constitutional:  Positive for chills.  Skin:  Positive for wound.    Physical Exam Updated Vital Signs BP (!) 160/105   Pulse 69   Temp 98.5 F (36.9 C) (Oral)   Resp (!) 21   Ht 5' 6 (1.676 m)   Wt 93 kg   LMP 10/16/2023   SpO2 99%   BMI 33.09 kg/m  Physical Exam Vitals and nursing note reviewed. Exam conducted with a chaperone present.  Constitutional:  General: She is not in acute distress.    Appearance: She is well-developed.  HENT:     Head: Normocephalic and atraumatic.     Right Ear: External ear normal.     Left Ear: External ear normal.     Nose: Nose normal.  Eyes:     Conjunctiva/sclera: Conjunctivae normal.  Cardiovascular:     Rate and Rhythm: Normal rate and regular rhythm.     Pulses: Normal pulses.     Heart sounds: Normal heart sounds. No murmur heard. Pulmonary:     Effort: Pulmonary effort is normal. No respiratory distress.     Breath sounds: Normal breath sounds.  Abdominal:     Palpations: Abdomen is soft.     Tenderness: There is no abdominal tenderness.  Genitourinary:      Comments: Approximately 8 cm of induration on the pubic mound with  redness.  Patient also has purulent drainage the area noted above. Musculoskeletal:        General: No swelling.     Cervical back: Normal range of motion and neck supple.     Comments: +1 pitting edema bilateral lower extremities  Skin:    General: Skin is warm and dry.     Capillary Refill: Capillary refill takes less than 2 seconds.  Neurological:     General: No focal deficit present.     Mental Status: She is alert.     Motor: No weakness.  Psychiatric:        Mood and Affect: Mood normal.     ED Results / Procedures / Treatments   Labs (all labs ordered are listed, but only abnormal results are displayed) Labs Reviewed  CBC WITH DIFFERENTIAL/PLATELET - Abnormal; Notable for the following components:      Result Value   WBC 11.4 (*)    Hemoglobin 11.1 (*)    HCT 34.4 (*)    nRBC 0.6 (*)    Abs Immature Granulocytes 0.64 (*)    All other components within normal limits  COMPREHENSIVE METABOLIC PANEL - Abnormal; Notable for the following components:   Glucose, Bld 236 (*)    Albumin 3.2 (*)    AST 14 (*)    All other components within normal limits  C-REACTIVE PROTEIN - Abnormal; Notable for the following components:   CRP 1.5 (*)    All other components within normal limits  CULTURE, BLOOD (ROUTINE X 2)  CULTURE, BLOOD (ROUTINE X 2)  I-STAT CG4 LACTIC ACID, ED  I-STAT CG4 LACTIC ACID, ED    EKG None  Radiology No results found.  Procedures .Critical Care  Performed by: Francis Ileana SAILOR, PA-C Authorized by: Francis Ileana SAILOR, PA-C   Critical care provider statement:    Critical care time (minutes):  35   Critical care was necessary to treat or prevent imminent or life-threatening deterioration of the following conditions:  Sepsis   Critical care was time spent personally by me on the following activities:  Blood draw for specimens, discussions with consultants, obtaining history from patient or surrogate, review of old charts, re-evaluation of patient's  condition, ordering and review of laboratory studies, ordering and review of radiographic studies and development of treatment plan with patient or surrogate   Care discussed with: admitting provider       Medications Ordered in ED Medications  ceFEPIme  (MAXIPIME ) 2 g in sodium chloride  0.9 % 100 mL IVPB (2 g Intravenous New Bag/Given 11/27/23 2335)  metroNIDAZOLE  (FLAGYL ) IVPB 500 mg (has no administration  in time range)  vancomycin  (VANCOREADY) IVPB 2000 mg/400 mL (2,000 mg Intravenous New Bag/Given 11/27/23 2236)  HYDROcodone -acetaminophen  (NORCO/VICODIN) 5-325 MG per tablet 1 tablet (1 tablet Oral Given 11/27/23 1237)  iohexol  (OMNIPAQUE ) 300 MG/ML solution 100 mL (100 mLs Intravenous Contrast Given 11/27/23 2328)    ED Course/ Medical Decision Making/ A&P                                 Medical Decision Making  This patient presents to the ED with chief complaint(s) of abscess with pertinent past medical history of lupus which further complicates the presenting complaint. The complaint involves an extensive differential diagnosis and also carries with it a high risk of complications and morbidity.    The differential diagnosis includes abscess, cellulitis   ED Course and Reassessment:   Independent labs interpretation:  The following labs were independently interpreted:  CBC: Leukocytosis at 11.4, mildly decreased hemoglobin at 11.1 CMP: Mildly decreased albumin, and AST CRP: 1.5 Lactic acid: 0.6, 0.9 Blood cultures: Pending  Imaging: CT pelvis with contrast: Pending  Patient signed out to Ubaldo High, PA-C at shift change pending imaging and likely admission for abscess with failed outpatient antibiotics.        Final Clinical Impression(s) / ED Diagnoses Final diagnoses:  None    Rx / DC Orders ED Discharge Orders     None         Francis Ileana SAILOR, PA-C 11/27/23 2346    Ruthe Cornet, DO 11/27/23 2347

## 2023-11-27 NOTE — ED Triage Notes (Signed)
 Pt arrived via POV. C/o abscess in L groin tha is painful and still draining after I&D 1x week ago.  Pt also has bilat leg swelling.

## 2023-11-27 NOTE — ED Provider Notes (Signed)
  Physical Exam  BP (!) 160/105   Pulse 69   Temp 98.5 F (36.9 C) (Oral)   Resp (!) 21   Ht 5' 6 (1.676 m)   Wt 93 kg   LMP 10/16/2023   SpO2 99%   BMI 33.09 kg/m   Physical Exam  Procedures  Procedures  ED Course / MDM    Medical Decision Making Amount and/or Complexity of Data Reviewed Radiology: ordered.  Risk Prescription drug management. Decision regarding hospitalization.   Patient care assumed from previous provider at shift handoff.  See her note for full details.  In short, 43 year old patient with abscess of the mons pubis region, drained on December 28 and treated outpatient with Bactrim .  Patient presents to the emergency complaining of increased drainage and no resolution of symptoms.  Patient with mildly elevated CRP at 1.5.  White count of 11,400.  At time of shift plan for likely admission for IV antibiotics pending results of CT scan.  CT abdomen pelvis with contrast shows: Subcutaneous stranding and a small amount of gas within the  subcutaneous soft tissues in the left groin. No well-defined focal  fluid collection to suggest drainable abscess.    Fibroid uterus.  Large pedunculated anterior fibroid unchanged.    Dr. Griselda also saw this patient at bedside and does not feel this represents necrotizing fasciitis clinically.  Discussed with Dr. Ebbie and asked that surgery round on the patient. Discussed with Dr.Rathore who agrees to have the day hospitalist team see patient for admission       Michelle Rojas 11/28/23 9660    Griselda Norris, MD 11/28/23 [phone]

## 2023-11-27 NOTE — ED Provider Notes (Signed)
 Shared PA visit.  Patient had I&D done of her mons pubis area a week ago has been on antibiotics that she finished but still having worsening swelling and drainage.  Does not appear to be a necrotizing fasciitis process but I do think that could be an abscess in this area where there is a complicated cellulitis.  Plan is for CT scan pelvis to further evaluate.  Anticipate admission.  Sepsis workup initiated.  Broad-spectrum IV antibiotics started.  Anticipate admission but need to make sure there is no surgical process ongoing.  She is well-appearing otherwise.  This chart was dictated using voice recognition software.  Despite best efforts to proofread,  errors can occur which can change the documentation meaning.    Ruthe Cornet, DO 11/27/23 2345

## 2023-11-27 NOTE — ED Provider Triage Note (Signed)
 Emergency Medicine Provider Triage Evaluation Note  Arline DELENA Dross , a 43 y.o. female  was evaluated in triage.  Pt complains of abscess.  Review of Systems  Positive: Abscess, joint swelling Negative: fever  Physical Exam  Pulse 82   Temp 98.3 F (36.8 C) (Oral)   Resp 18   LMP 10/16/2023   SpO2 100%  Gen:   Awake, no distress   Resp:  Normal effort  MSK:   Moves extremities without difficulty  Other:    Medical Decision Making  Medically screening exam initiated at 12:26 PM.  Appropriate orders placed.  Mulki A Simone was informed that the remainder of the evaluation will be completed by another provider, this initial triage assessment does not replace that evaluation, and the importance of remaining in the ED until their evaluation is complete.  History of autoimmune hepatitis, chronic prednisone  I&D of pubic abscess last week, continues to be painful, swollen. Now with LE edema. Seen by PCP this morning and sent to ED for IV abx.   Odell Balls, PA-C 11/27/23 1227

## 2023-11-28 ENCOUNTER — Other Ambulatory Visit: Payer: Self-pay

## 2023-11-28 ENCOUNTER — Inpatient Hospital Stay (HOSPITAL_COMMUNITY): Payer: 59 | Admitting: Anesthesiology

## 2023-11-28 ENCOUNTER — Encounter (HOSPITAL_COMMUNITY)
Admission: EM | Disposition: A | Discharge status: Home / Self Care | Payer: Self-pay | Source: Home / Self Care | Attending: Internal Medicine

## 2023-11-28 ENCOUNTER — Encounter (HOSPITAL_COMMUNITY): Payer: Self-pay | Admitting: Internal Medicine

## 2023-11-28 DIAGNOSIS — Z79621 Long term (current) use of calcineurin inhibitor: Secondary | ICD-10-CM | POA: Diagnosis not present

## 2023-11-28 DIAGNOSIS — D259 Leiomyoma of uterus, unspecified: Secondary | ICD-10-CM | POA: Diagnosis present

## 2023-11-28 DIAGNOSIS — N764 Abscess of vulva: Secondary | ICD-10-CM

## 2023-11-28 DIAGNOSIS — N762 Acute vulvitis: Secondary | ICD-10-CM | POA: Diagnosis present

## 2023-11-28 DIAGNOSIS — M797 Fibromyalgia: Secondary | ICD-10-CM | POA: Diagnosis present

## 2023-11-28 DIAGNOSIS — T380X5A Adverse effect of glucocorticoids and synthetic analogues, initial encounter: Secondary | ICD-10-CM | POA: Diagnosis present

## 2023-11-28 DIAGNOSIS — E119 Type 2 diabetes mellitus without complications: Secondary | ICD-10-CM

## 2023-11-28 DIAGNOSIS — M351 Other overlap syndromes: Secondary | ICD-10-CM | POA: Diagnosis present

## 2023-11-28 DIAGNOSIS — K219 Gastro-esophageal reflux disease without esophagitis: Secondary | ICD-10-CM | POA: Diagnosis present

## 2023-11-28 DIAGNOSIS — E669 Obesity, unspecified: Secondary | ICD-10-CM | POA: Diagnosis present

## 2023-11-28 DIAGNOSIS — I10 Essential (primary) hypertension: Secondary | ICD-10-CM | POA: Diagnosis present

## 2023-11-28 DIAGNOSIS — Z8249 Family history of ischemic heart disease and other diseases of the circulatory system: Secondary | ICD-10-CM | POA: Diagnosis not present

## 2023-11-28 DIAGNOSIS — F419 Anxiety disorder, unspecified: Secondary | ICD-10-CM | POA: Diagnosis present

## 2023-11-28 DIAGNOSIS — Z79899 Other long term (current) drug therapy: Secondary | ICD-10-CM | POA: Diagnosis not present

## 2023-11-28 DIAGNOSIS — E1165 Type 2 diabetes mellitus with hyperglycemia: Secondary | ICD-10-CM | POA: Diagnosis present

## 2023-11-28 DIAGNOSIS — Z6833 Body mass index (BMI) 33.0-33.9, adult: Secondary | ICD-10-CM | POA: Diagnosis not present

## 2023-11-28 DIAGNOSIS — D84821 Immunodeficiency due to drugs: Secondary | ICD-10-CM | POA: Diagnosis present

## 2023-11-28 DIAGNOSIS — E66811 Obesity, class 1: Secondary | ICD-10-CM | POA: Diagnosis present

## 2023-11-28 DIAGNOSIS — G8929 Other chronic pain: Secondary | ICD-10-CM | POA: Diagnosis present

## 2023-11-28 DIAGNOSIS — M329 Systemic lupus erythematosus, unspecified: Secondary | ICD-10-CM | POA: Diagnosis present

## 2023-11-28 DIAGNOSIS — Z794 Long term (current) use of insulin: Secondary | ICD-10-CM

## 2023-11-28 DIAGNOSIS — K754 Autoimmune hepatitis: Secondary | ICD-10-CM | POA: Diagnosis present

## 2023-11-28 DIAGNOSIS — Z7952 Long term (current) use of systemic steroids: Secondary | ICD-10-CM | POA: Diagnosis not present

## 2023-11-28 HISTORY — PX: IRRIGATION AND DEBRIDEMENT ABSCESS: SHX5252

## 2023-11-28 LAB — CBG MONITORING, ED: Glucose-Capillary: 148 mg/dL — ABNORMAL HIGH (ref 70–99)

## 2023-11-28 LAB — GLUCOSE, CAPILLARY
Glucose-Capillary: 147 mg/dL — ABNORMAL HIGH (ref 70–99)
Glucose-Capillary: 178 mg/dL — ABNORMAL HIGH (ref 70–99)
Glucose-Capillary: 155 mg/dL — ABNORMAL HIGH (ref 70–99)

## 2023-11-28 LAB — HIV ANTIBODY (ROUTINE TESTING W REFLEX): HIV Screen 4th Generation wRfx: NONREACTIVE

## 2023-11-28 SURGERY — IRRIGATION AND DEBRIDEMENT ABSCESS
Anesthesia: General

## 2023-11-28 MED ORDER — ONDANSETRON HCL 4 MG/2ML IJ SOLN
INTRAMUSCULAR | Status: AC
Start: 2023-11-28 — End: ?
  Filled 2023-11-28: qty 2

## 2023-11-28 MED ORDER — ORAL CARE MOUTH RINSE: 15.0000 mL | Freq: Once | OROMUCOSAL | Status: AC

## 2023-11-28 MED ORDER — MIDAZOLAM HCL 5 MG/5ML IJ SOLN
INTRAMUSCULAR | Status: DC | PRN
  Administered 2023-11-28: 2 mg via INTRAVENOUS

## 2023-11-28 MED ORDER — ONDANSETRON HCL 4 MG/2ML IJ SOLN
4.0000 mg | Freq: Four times a day (QID) | INTRAMUSCULAR | Status: DC | PRN
  Administered 2023-11-29 – 2023-11-30 (×2): 4 mg via INTRAVENOUS
  Filled 2023-11-28 (×2): qty 2

## 2023-11-28 MED ORDER — CHLORHEXIDINE GLUCONATE 0.12 % MT SOLN
15.0000 mL | Freq: Once | OROMUCOSAL | Status: AC
Start: 2023-11-28 — End: 2023-11-28
  Administered 2023-11-28: 15 mL via OROMUCOSAL

## 2023-11-28 MED ORDER — INSULIN GLARGINE-YFGN 100 UNIT/ML ~~LOC~~ SOLN
20.0000 [IU] | Freq: Every day | SUBCUTANEOUS | Status: DC
  Administered 2023-11-28 – 2023-11-29 (×2): 20 [IU] via SUBCUTANEOUS
  Filled 2023-11-28 (×2): qty 0.2

## 2023-11-28 MED ORDER — FLUTICASONE PROPIONATE 50 MCG/ACT NA SUSP: 1.0000 | Freq: Every day | NASAL | Status: DC | PRN

## 2023-11-28 MED ORDER — ONDANSETRON HCL 4 MG/2ML IJ SOLN
INTRAMUSCULAR | Status: DC | PRN
  Administered 2023-11-28: 4 mg via INTRAVENOUS

## 2023-11-28 MED ORDER — ATENOLOL 25 MG PO TABS
25.0000 mg | ORAL_TABLET | Freq: Every day | ORAL | Status: DC
  Administered 2023-11-28 – 2023-11-30 (×3): 25 mg via ORAL
  Filled 2023-11-28 (×3): qty 1
  Filled 2023-11-28 (×2): qty 0.5

## 2023-11-28 MED ORDER — BACLOFEN 10 MG PO TABS
10.0000 mg | ORAL_TABLET | Freq: Three times a day (TID) | ORAL | Status: DC
  Administered 2023-11-28 – 2023-11-30 (×5): 10 mg via ORAL
  Filled 2023-11-28 (×5): qty 1

## 2023-11-28 MED ORDER — ACETAMINOPHEN 325 MG PO TABS
650.0000 mg | ORAL_TABLET | Freq: Four times a day (QID) | ORAL | Status: DC | PRN
Start: 2023-11-28 — End: 2023-11-28

## 2023-11-28 MED ORDER — PANTOPRAZOLE SODIUM 40 MG PO TBEC
40.0000 mg | DELAYED_RELEASE_TABLET | Freq: Every day | ORAL | Status: DC
  Administered 2023-11-28 – 2023-11-30 (×3): 40 mg via ORAL
  Filled 2023-11-28 (×3): qty 1

## 2023-11-28 MED ORDER — HYDROMORPHONE HCL 1 MG/ML IJ SOLN
0.5000 mg | INTRAMUSCULAR | Status: DC | PRN
  Administered 2023-11-29: 0.5 mg via INTRAVENOUS
  Filled 2023-11-28: qty 0.5

## 2023-11-28 MED ORDER — ACETAMINOPHEN 325 MG PO TABS
650.0000 mg | ORAL_TABLET | Freq: Four times a day (QID) | ORAL | Status: DC
  Administered 2023-11-28 – 2023-11-30 (×7): 650 mg via ORAL
  Filled 2023-11-28 (×7): qty 2

## 2023-11-28 MED ORDER — INSULIN ASPART 100 UNIT/ML IJ SOLN
2.0000 [IU] | INTRAMUSCULAR | Status: AC
  Administered 2023-11-28: 2 [IU] via SUBCUTANEOUS
  Filled 2023-11-28: qty 0.02

## 2023-11-28 MED ORDER — FENTANYL CITRATE (PF) 100 MCG/2ML IJ SOLN
INTRAMUSCULAR | Status: AC
  Filled 2023-11-28: qty 2

## 2023-11-28 MED ORDER — LIDOCAINE HCL (PF) 2 % IJ SOLN
INTRAMUSCULAR | Status: AC
  Filled 2023-11-28: qty 5

## 2023-11-28 MED ORDER — INSULIN ASPART 100 UNIT/ML IJ SOLN
INTRAMUSCULAR | Status: AC
  Filled 2023-11-28: qty 1

## 2023-11-28 MED ORDER — ALBUTEROL SULFATE (2.5 MG/3ML) 0.083% IN NEBU: 2.5000 mg | INHALATION_SOLUTION | Freq: Four times a day (QID) | RESPIRATORY_TRACT | Status: DC | PRN

## 2023-11-28 MED ORDER — ACETAMINOPHEN 10 MG/ML IV SOLN
INTRAVENOUS | Status: AC
  Filled 2023-11-28: qty 100

## 2023-11-28 MED ORDER — PROPOFOL 10 MG/ML IV BOLUS
INTRAVENOUS | Status: DC | PRN
  Administered 2023-11-28: 180 mg via INTRAVENOUS

## 2023-11-28 MED ORDER — LACTATED RINGERS IV SOLN: INTRAVENOUS | Status: DC

## 2023-11-28 MED ORDER — BUPIVACAINE-EPINEPHRINE 0.25% -1:200000 IJ SOLN
INTRAMUSCULAR | Status: DC | PRN
  Administered 2023-11-28: 30 mL

## 2023-11-28 MED ORDER — PROPOFOL 10 MG/ML IV BOLUS
INTRAVENOUS | Status: AC
  Filled 2023-11-28: qty 20

## 2023-11-28 MED ORDER — PIPERACILLIN-TAZOBACTAM 3.375 G IVPB: 3.3750 g | Freq: Three times a day (TID) | INTRAVENOUS | Status: DC

## 2023-11-28 MED ORDER — METRONIDAZOLE 500 MG/100ML IV SOLN
500.0000 mg | Freq: Two times a day (BID) | INTRAVENOUS | Status: DC
  Administered 2023-11-28 – 2023-11-29 (×4): 500 mg via INTRAVENOUS
  Filled 2023-11-28 (×5): qty 100

## 2023-11-28 MED ORDER — ACETAMINOPHEN 650 MG RE SUPP: 650.0000 mg | Freq: Four times a day (QID) | RECTAL | Status: DC

## 2023-11-28 MED ORDER — INSULIN ASPART 100 UNIT/ML IJ SOLN
0.0000 [IU] | INTRAMUSCULAR | Status: DC
  Administered 2023-11-28: 2 [IU] via SUBCUTANEOUS
  Administered 2023-11-29: 8 [IU] via SUBCUTANEOUS
  Administered 2023-11-29: 3 [IU] via SUBCUTANEOUS
  Filled 2023-11-28: qty 0.15

## 2023-11-28 MED ORDER — LIDOCAINE HCL (CARDIAC) PF 100 MG/5ML IV SOSY
PREFILLED_SYRINGE | INTRAVENOUS | Status: DC | PRN
  Administered 2023-11-28: 100 mg via INTRAVENOUS

## 2023-11-28 MED ORDER — TACROLIMUS 1 MG PO CAPS
2.0000 mg | ORAL_CAPSULE | Freq: Two times a day (BID) | ORAL | Status: DC
Start: 2023-11-28 — End: 2023-11-28

## 2023-11-28 MED ORDER — FENTANYL CITRATE (PF) 100 MCG/2ML IJ SOLN
INTRAMUSCULAR | Status: DC | PRN
  Administered 2023-11-28 (×2): 50 ug via INTRAVENOUS

## 2023-11-28 MED ORDER — FENTANYL CITRATE PF 50 MCG/ML IJ SOSY
PREFILLED_SYRINGE | INTRAMUSCULAR | Status: AC
  Filled 2023-11-28: qty 1

## 2023-11-28 MED ORDER — MIDAZOLAM HCL 2 MG/2ML IJ SOLN
INTRAMUSCULAR | Status: AC
Start: 2023-11-28 — End: ?
  Filled 2023-11-28: qty 2

## 2023-11-28 MED ORDER — ENOXAPARIN SODIUM 40 MG/0.4ML IJ SOSY
40.0000 mg | PREFILLED_SYRINGE | INTRAMUSCULAR | Status: DC
  Administered 2023-11-28 – 2023-11-30 (×3): 40 mg via SUBCUTANEOUS
  Filled 2023-11-28 (×3): qty 0.4

## 2023-11-28 MED ORDER — TACROLIMUS 1 MG PO CAPS
2.0000 mg | ORAL_CAPSULE | Freq: Every day | ORAL | Status: DC
  Administered 2023-11-28 – 2023-11-29 (×2): 2 mg via ORAL
  Filled 2023-11-28 (×2): qty 2

## 2023-11-28 MED ORDER — FENTANYL CITRATE PF 50 MCG/ML IJ SOSY
25.0000 ug | PREFILLED_SYRINGE | INTRAMUSCULAR | Status: DC | PRN
  Administered 2023-11-28: 50 ug via INTRAVENOUS

## 2023-11-28 MED ORDER — ACETAMINOPHEN 10 MG/ML IV SOLN
1000.0000 mg | Freq: Once | INTRAVENOUS | Status: DC | PRN
Start: 2023-11-28 — End: 2023-11-28
  Administered 2023-11-28: 1000 mg via INTRAVENOUS

## 2023-11-28 MED ORDER — VANCOMYCIN HCL IN DEXTROSE 1-5 GM/200ML-% IV SOLN
1000.0000 mg | Freq: Two times a day (BID) | INTRAVENOUS | Status: DC
  Administered 2023-11-28 – 2023-11-30 (×5): 1000 mg via INTRAVENOUS
  Filled 2023-11-28 (×6): qty 200

## 2023-11-28 MED ORDER — OXYCODONE HCL 5 MG PO TABS
5.0000 mg | ORAL_TABLET | ORAL | Status: DC | PRN
  Administered 2023-11-28 – 2023-11-30 (×6): 10 mg via ORAL
  Filled 2023-11-28 (×6): qty 2

## 2023-11-28 MED ORDER — SODIUM CHLORIDE 0.9 % IV SOLN
2.0000 g | Freq: Three times a day (TID) | INTRAVENOUS | Status: DC
  Administered 2023-11-28 – 2023-11-30 (×7): 2 g via INTRAVENOUS
  Filled 2023-11-28 (×7): qty 12.5

## 2023-11-28 MED ORDER — AMLODIPINE BESYLATE 10 MG PO TABS
10.0000 mg | ORAL_TABLET | Freq: Every day | ORAL | Status: DC
  Administered 2023-11-28 – 2023-11-30 (×3): 10 mg via ORAL
  Filled 2023-11-28: qty 1
  Filled 2023-11-28: qty 2
  Filled 2023-11-28: qty 1

## 2023-11-28 MED ORDER — ACETAMINOPHEN 650 MG RE SUPP
650.0000 mg | Freq: Four times a day (QID) | RECTAL | Status: DC | PRN
Start: 2023-11-28 — End: 2023-11-28

## 2023-11-28 MED ORDER — ALBUTEROL SULFATE (2.5 MG/3ML) 0.083% IN NEBU: 2.5000 mg | INHALATION_SOLUTION | Freq: Four times a day (QID) | RESPIRATORY_TRACT | Status: DC

## 2023-11-28 MED ORDER — ONDANSETRON HCL 4 MG PO TABS: 4.0000 mg | ORAL_TABLET | Freq: Four times a day (QID) | ORAL | Status: DC | PRN

## 2023-11-28 MED ORDER — PREDNISONE 5 MG PO TABS
10.0000 mg | ORAL_TABLET | Freq: Two times a day (BID) | ORAL | Status: DC
  Administered 2023-11-28 – 2023-11-30 (×5): 10 mg via ORAL
  Filled 2023-11-28 (×5): qty 2

## 2023-11-28 MED ORDER — BUPIVACAINE-EPINEPHRINE 0.25% -1:200000 IJ SOLN
INTRAMUSCULAR | Status: AC
  Filled 2023-11-28: qty 1

## 2023-11-28 MED ORDER — TACROLIMUS 1 MG PO CAPS
3.0000 mg | ORAL_CAPSULE | Freq: Every day | ORAL | Status: DC
  Administered 2023-11-28 – 2023-11-30 (×3): 3 mg via ORAL
  Filled 2023-11-28 (×3): qty 3

## 2023-11-28 MED ORDER — OXYCODONE HCL 5 MG PO TABS
5.0000 mg | ORAL_TABLET | ORAL | Status: DC | PRN
  Administered 2023-11-28: 5 mg via ORAL
  Filled 2023-11-28: qty 1

## 2023-11-28 MED ORDER — DEXAMETHASONE SODIUM PHOSPHATE 10 MG/ML IJ SOLN
INTRAMUSCULAR | Status: AC
Start: 2023-11-28 — End: ?
  Filled 2023-11-28: qty 1

## 2023-11-28 MED ORDER — BASAGLAR KWIKPEN 100 UNIT/ML ~~LOC~~ SOPN: 20.0000 [IU] | PEN_INJECTOR | Freq: Every morning | SUBCUTANEOUS | Status: DC

## 2023-11-28 SURGICAL SUPPLY — 29 items
BAG COUNTER SPONGE SURGICOUNT (BAG) IMPLANT
COVER SURGICAL LIGHT HANDLE (MISCELLANEOUS) ×2 IMPLANT
DERMABOND ADVANCED .7 DNX12 (GAUZE/BANDAGES/DRESSINGS) IMPLANT
DRAPE LAPAROSCOPIC ABDOMINAL (DRAPES) IMPLANT
DRAPE LAPAROTOMY T 102X78X121 (DRAPES) IMPLANT
DRAPE LAPAROTOMY TRNSV 102X78 (DRAPES) IMPLANT
DRAPE SHEET LG 3/4 BI-LAMINATE (DRAPES) IMPLANT
ELECT REM PT RETURN 15FT ADLT (MISCELLANEOUS) ×2 IMPLANT
GAUZE SPONGE 4X4 12PLY STRL (GAUZE/BANDAGES/DRESSINGS) ×2 IMPLANT
GAUZE STRETCH 2X75IN STRL (MISCELLANEOUS) IMPLANT
GLOVE BIO SURGEON STRL SZ7 (GLOVE) ×6 IMPLANT
GLOVE BIO SURGEON STRL SZ7.5 (GLOVE) ×2 IMPLANT
GLOVE BIOGEL PI IND STRL 7.0 (GLOVE) ×2 IMPLANT
GLOVE BIOGEL PI IND STRL 7.5 (GLOVE) ×6 IMPLANT
GOWN STRL REUS W/ TWL XL LVL3 (GOWN DISPOSABLE) ×4 IMPLANT
KIT BASIN OR (CUSTOM PROCEDURE TRAY) ×2 IMPLANT
KIT TURNOVER KIT A (KITS) IMPLANT
MARKER SKIN DUAL TIP RULER LAB (MISCELLANEOUS) ×2 IMPLANT
NDL HYPO 25X1 1.5 SAFETY (NEEDLE) ×2 IMPLANT
NEEDLE HYPO 25X1 1.5 SAFETY (NEEDLE) ×1 IMPLANT
NS IRRIG 1000ML POUR BTL (IV SOLUTION) ×2 IMPLANT
PACK GENERAL/GYN (CUSTOM PROCEDURE TRAY) ×2 IMPLANT
SPIKE FLUID TRANSFER (MISCELLANEOUS) IMPLANT
SPONGE T-LAP 4X18 ~~LOC~~+RFID (SPONGE) IMPLANT
STAPLER SKIN PROX WIDE 3.9 (STAPLE) IMPLANT
SUT MNCRL AB 4-0 PS2 18 (SUTURE) IMPLANT
SUT VIC AB 3-0 SH 18 (SUTURE) IMPLANT
SYR CONTROL 10ML LL (SYRINGE) ×2 IMPLANT
TOWEL OR 17X26 10 PK STRL BLUE (TOWEL DISPOSABLE) ×2 IMPLANT

## 2023-11-28 NOTE — Consult Note (Signed)
 Consult Note  Michelle Rojas 11-Sep-1981  [id number].    Requesting MD: Juliene Bicker, DO Chief Complaint/Reason for Consult: mons pubis/labial abscess HPI:  Patient is a 43 year old female who presented to the ED with worsening pain, redness and swelling of mons pubis and labia. She underwent I&D of left mons 12/28 in the ED. She reports that she got some better after that but never fully got better. It has intermittently been draining from I&D site at left inguinal fold but also from left labia. Hurts to sit/stand/walk and has progressively gotten worse. She has had chills but denies fever at home.   PMH otherwise significant for autoimmune hepatitis, steroid induced hyperglycemia, lupus, HTN, fibromyalgia, anxiety, uterine fibroids. Not on blood thinners and NKDA.   ROS: Negative other than HPI  Family History  Problem Relation Age of Onset   Hypertension Mother    Hypertension Father    Healthy Brother    Healthy Brother    Hypertension Maternal Grandmother    Healthy Daughter    Healthy Daughter    Colon cancer Neg Hx    Esophageal cancer Neg Hx    Stomach cancer Neg Hx     Past Medical History:  Diagnosis Date   AMA + Autoimmune hepatitis (HCC) 08/10/2023   Anxiety    PP treated with counseling no meds   Fibromyalgia    GERD (gastroesophageal reflux disease)    History of palpitations    negative work-up by cardiologist-- dr delford-- 2015   Hypertension    taking labetalol    Lupus (systemic lupus erythematosus) (HCC)    MCTD (mixed connective tissue disease) (HCC)    dx 07/ 2015--  rheumotologist--  dr jon jacob   Nausea    Numbness and tingling    Wears contact lenses     Past Surgical History:  Procedure Laterality Date   BIOPSY STOMACH     DILATION AND EVACUATION N/A 06/17/2016   Procedure: DILATATION AND EVACUATION;  Surgeon: Duwaine Blumenthal, DO;  Location: [facility];  Service: Gynecology;  Laterality: N/A;  WTIH SUCTION    LIVER BIOPSY     WISDOM TOOTH EXTRACTION  2012    Social History:  reports that she has never smoked. She has never used smokeless tobacco. She reports current alcohol use of about 1.0 standard drink of alcohol per week. She reports that she does not use drugs.  Allergies: No Known Allergies  (Not in a hospital admission)   Blood pressure (!) 135/107, pulse 79, temperature 98.1 F (36.7 C), temperature source Oral, resp. rate 17, height 5' 6 (1.676 m), weight 93 kg, last menstrual period 10/16/2023, SpO2 100%, unknown if currently breastfeeding. Physical Exam:  General: pleasant, WD, overweight female who is laying in bed in NAD HEENT: head is normocephalic, atraumatic.  Sclera are noninjected.  EOMI.  Ears and nose without any masses or lesions.  Mouth is pink and moist Heart: regular, rate, and rhythm.   Lungs:  Respiratory effort nonlabored Abd: soft, NT, ND GU: erythema and induration of mons pubis L>R, small stab incision in left mons scabbed over and not draining, fluctuant area ~3 cm in length along left labia  MS: all 4 extremities are symmetrical with no cyanosis, clubbing, or edema. Skin: warm and dry with no masses, lesions, or rashes Neuro: Cranial nerves 2-12 grossly intact, sensation is normal throughout Psych: A&Ox3 with an appropriate affect.   Results for orders placed or performed during the hospital  encounter of 11/27/23 (from the past 48 hours)  CBC with Differential     Status: Abnormal   Collection Time: 11/27/23 12:42 PM  Result Value Ref Range   WBC 11.4 (H) 4.0 - 10.5 K/uL   RBC 4.04 3.87 - 5.11 MIL/uL   Hemoglobin 11.1 (L) 12.0 - 15.0 g/dL   HCT 65.5 (L) 63.9 - 53.9 %   MCV 85.1 80.0 - 100.0 fL   MCH 27.5 26.0 - 34.0 pg   MCHC 32.3 30.0 - 36.0 g/dL   RDW 85.3 88.4 - 84.4 %   Platelets 321 150 - 400 K/uL   nRBC 0.6 (H) 0.0 - 0.2 %   Neutrophils Relative % 56 %   Neutro Abs 6.5 1.7 - 7.7 K/uL   Lymphocytes Relative 30 %   Lymphs Abs 3.4 0.7 - 4.0  K/uL   Monocytes Relative 7 %   Monocytes Absolute 0.8 0.1 - 1.0 K/uL   Eosinophils Relative 0 %   Eosinophils Absolute 0.0 0.0 - 0.5 K/uL   Basophils Relative 1 %   Basophils Absolute 0.1 0.0 - 0.1 K/uL   RBC Morphology MORPHOLOGY UNREMARKABLE    Immature Granulocytes 6 %   Abs Immature Granulocytes 0.64 (H) 0.00 - 0.07 K/uL   Platelet Morphology NORMAL     Comment: Performed at [facility], [address], [place], [place] [address]  Comprehensive metabolic panel     Status: Abnormal   Collection Time: 11/27/23 12:42 PM  Result Value Ref Range   Sodium 137 135 - 145 mmol/L   Potassium 3.8 3.5 - 5.1 mmol/L   Chloride 104 98 - 111 mmol/L   CO2 25 22 - 32 mmol/L   Glucose, Bld 236 (H) 70 - 99 mg/dL    Comment: Glucose reference range applies only to samples taken after fasting for at least 8 hours.   BUN 11 6 - 20 mg/dL   Creatinine, Ser 9.34 0.44 - 1.00 mg/dL   Calcium 9.0 8.9 - 89.6 mg/dL   Total Protein 6.7 6.5 - 8.1 g/dL   Albumin 3.2 (L) 3.5 - 5.0 g/dL   AST 14 (L) 15 - 41 U/L   ALT 30 0 - 44 U/L   Alkaline Phosphatase 90 38 - 126 U/L   Total Bilirubin 0.4 0.0 - 1.2 mg/dL   GFR, Estimated >39 >39 mL/min    Comment: (NOTE) Calculated using the CKD-EPI Creatinine Equation (2021)    Anion gap 8 5 - 15    Comment: Performed at [facility], [address], [place], [place] [address]  C-reactive protein     Status: Abnormal   Collection Time: 11/27/23 12:42 PM  Result Value Ref Range   CRP 1.5 (H) <1.0 mg/dL    Comment: Performed at [facility], [address], [place], [place] [address]  I-Stat CG4 Lactic Acid     Status: None   Collection Time: 11/27/23 12:52 PM  Result Value Ref Range   Lactic Acid, Venous 0.6 0.5 - 1.9 mmol/L  I-Stat CG4 Lactic Acid     Status: None   Collection Time: 11/27/23 10:19 PM  Result Value Ref Range   Lactic Acid, Venous 0.9 0.5 - 1.9 mmol/L   CT PELVIS W CONTRAST Result Date:  11/28/2023 CLINICAL DATA:  Left groin abscess painful and draining. Recent incision and drainage. EXAM: CT PELVIS WITH CONTRAST TECHNIQUE: Multidetector CT imaging of the pelvis was performed using the standard protocol following the bolus administration  of intravenous contrast. RADIATION DOSE REDUCTION: This exam was performed according to the departmental dose-optimization program which includes automated exposure control, adjustment of the mA and/or kV according to patient size and/or use of iterative reconstruction technique. CONTRAST:  OMNIPAQUE  IOHEXOL  300 MG/ML  SOLN COMPARISON:  08/11/2023 FINDINGS: Urinary Tract:  No abnormality visualized. Bowel:  Unremarkable visualized pelvic bowel loops. Vascular/Lymphatic: No pathologically enlarged lymph nodes. No significant vascular abnormality seen. Reproductive: Fibroid uterus including exophytic/pedunculated fundal fibroid measuring 8.4 x 7.2 cm. This is not significantly changed since prior study. Other: No free fluid or free air. Subcutaneous stranding noted within the left groin. No well-defined drainable fluid collection to suggest abscess. This presumably reflects cellulitis. Musculoskeletal: No acute bony abnormality. IMPRESSION: Subcutaneous stranding and a small amount of gas within the subcutaneous soft tissues in the left groin. No well-defined focal fluid collection to suggest drainable abscess. Fibroid uterus.  Large pedunculated anterior fibroid unchanged. Electronically Signed   By: Franky Crease M.D.   On: 11/28/2023 00:01      Assessment/Plan Mons/labial cellulitis with abscess - WBC 11.4, afebrile and HD stable  - significant tenderness and cellulitis with fluctuant area on exam  - recommend I&D in the OR to ensure adequate drainage  - pt aware she will have wound care needs post-op  - recommend medical admission   FEN: NPO VTE: SCDs ID: [email or web address]  1/7>>  - per admitting -  autoimmune hepatitis steroid induced  hyperglycemia Lupus HTN Fibromyalgia Anxiety uterine fibroids  I reviewed ED provider notes, last 24 h vitals and pain scores, last 48 h intake and output, last 24 h labs and trends, and last 24 h imaging results.  This care required high  level of medical decision making.   Burnard JONELLE Louder, [facility] 11/28/2023, 8:02 AM Please see Amion for pager number during day hours 7:00am-4:30pm

## 2023-11-28 NOTE — Anesthesia Preprocedure Evaluation (Signed)
 Anesthesia Evaluation  Patient identified by MRN, date of birth, ID band Patient awake    Reviewed: Allergy & Precautions, NPO status , Patient's Chart, lab work & pertinent test results  Airway Mallampati: II  TM Distance: >3 FB Neck ROM: Full    Dental no notable dental hx.    Pulmonary neg pulmonary ROS   Pulmonary exam normal        Cardiovascular hypertension, Pt. on medications  Rhythm:Regular Rate:Normal     Neuro/Psych   Anxiety     negative neurological ROS     GI/Hepatic ,GERD  Medicated,,  Endo/Other  diabetes, Type 2, Insulin  Dependent    Renal/GU negative Renal ROS  negative genitourinary   Musculoskeletal  (+)  Fibromyalgia -  Abdominal Normal abdominal exam  (+)   Peds  Hematology Lab Results      Component                Value               Date                      WBC                      11.4 (H)            11/27/2023                HGB                      11.1 (L)            11/27/2023                HCT                      34.4 (L)            11/27/2023                MCV                      85.1                11/27/2023                PLT                      321                 11/27/2023              Anesthesia Other Findings   Reproductive/Obstetrics                             Anesthesia Physical Anesthesia Plan  ASA: 3  Anesthesia Plan: General   Post-op Pain Management:    Induction: Intravenous  PONV Risk Score and Plan: 3 and Ondansetron , Dexamethasone , Midazolam  and Treatment may vary due to age or medical condition  Airway Management Planned: Mask and LMA  Additional Equipment: None  Intra-op Plan:   Post-operative Plan: Extubation in OR  Informed Consent: I have reviewed the patients History and Physical, chart, labs and discussed the procedure including the risks, benefits and alternatives for the proposed anesthesia with the patient or  authorized representative who has indicated his/her understanding and acceptance.  Dental advisory given  Plan Discussed with: CRNA  Anesthesia Plan Comments:        Anesthesia Quick Evaluation

## 2023-11-28 NOTE — Anesthesia Postprocedure Evaluation (Signed)
 Anesthesia Post Note  Patient: Jeri A Shannon  Procedure(s) Performed: IRRIGATION AND DEBRIDEMENT ABSCESS     Patient location during evaluation: PACU Anesthesia Type: General Level of consciousness: awake and alert Pain management: pain level controlled Vital Signs Assessment: post-procedure vital signs reviewed and stable Respiratory status: spontaneous breathing, nonlabored ventilation, respiratory function stable and patient connected to nasal cannula oxygen Cardiovascular status: blood pressure returned to baseline and stable Postop Assessment: no apparent nausea or vomiting Anesthetic complications: no   No notable events documented.  Last Vitals:  Vitals:   11/28/23 1600 11/28/23 1615  BP: (!) 133/98 (!) 131/92  Pulse: 65 67  Resp: 16 16  Temp:    SpO2: 95% 97%    Last Pain:  Vitals:   11/28/23 1658  TempSrc:   PainSc: 8                  Jasiah Elsen P Shanty Ginty

## 2023-11-28 NOTE — Transfer of Care (Signed)
 Immediate Anesthesia Transfer of Care Note  Patient: Michelle Rojas  Procedure(s) Performed: IRRIGATION AND DEBRIDEMENT ABSCESS  Patient Location: PACU  Anesthesia Type:General  Level of Consciousness: awake, alert , oriented, and patient cooperative  Airway & Oxygen Therapy: Patient Spontanous Breathing and Patient connected to face mask oxygen  Post-op Assessment: Report given to RN and Post -op Vital signs reviewed and stable  Post vital signs: Reviewed and stable  Last Vitals:  Vitals Value Taken Time  BP 151/101 11/28/23 1445  Temp 36.8 C 11/28/23 1445  Pulse 74 11/28/23 1446  Resp 17 11/28/23 1446  SpO2 100 % 11/28/23 1446  Vitals shown include unfiled device data.  Last Pain:  Vitals:   11/28/23 1445  TempSrc:   PainSc: 6          Complications: No notable events documented.

## 2023-11-28 NOTE — Anesthesia Procedure Notes (Signed)
 Procedure Name: LMA Insertion Date/Time: 11/28/2023 2:07 PM  Performed by: Erick Fitz, CRNAPre-anesthesia Checklist: Patient identified, Emergency Drugs available, Suction available, Patient being monitored and Timeout performed Patient Re-evaluated:Patient Re-evaluated prior to induction Oxygen Delivery Method: Circle system utilized Preoxygenation: Pre-oxygenation with 100% oxygen Induction Type: IV induction Ventilation: Mask ventilation without difficulty LMA: LMA inserted and LMA with gastric port inserted LMA Size: 4.0 Number of attempts: 1 Dental Injury: Teeth and Oropharynx as per pre-operative assessment

## 2023-11-28 NOTE — Op Note (Signed)
 IRRIGATION AND DEBRIDEMENT ABSCESS  Procedure Note  Michelle Rojas 11/27/2023 - 11/28/2023   Pre-op Diagnosis: MONS/LABIAL ABSCESS     Post-op Diagnosis: same  Procedure(s): IRRIGATION AND DEBRIDEMENT MONS AND LABIAL ABSCESS  Surgeon(s): Vernetta Berg, MD  Anesthesia: Choice  Staff:  Circulator: Gretta Leonor CROME, RN; Dyana Powell CROME, RN Scrub Person: Rama Jacques MALACHI Mac, Stacia L  Estimated Blood Loss: Minimal               Findings: The patient is found to have a moderate-sized abscess on her left mons pubis area as well as a much smaller abscess on her left labia.  Cultures were sent  Procedure: The patient was brought to the operating identifies correct patient.  She was placed upon on the operating table and general anesthesia was induced.  I then frog-legged her left leg.  We then prepped and draped her groin in the usual sterile fashion.  There were 2 separate fluctuant areas.  1 was on the left side of the mons and the other was on the left labia.  I anesthetized both areas widely with Marcaine .  I made an incision in the mons area first and entered a abscess cavity that contained mostly clear slightly turbid fluid.  I evacuated the fluid and then probed in multiple directions and found no further extension immediately or toward the labia.  There was no necrotic tissue to debride.  I then opened up the small fluctuant area on her left labia with a scalpel and there was thick or purulent fluid there.  Again this did not extend in any direction was superficial.  We then irrigated both wounds with saline.  I injected Marcaine  around both areas again.  I then packed both wounds with a 2 inch gauze.  Dry gauze were then placed over this.  The patient tolerated the procedure well.  All the counts were correct at the end of the procedure.  The patient was then extubated in the operating room and taken in a stable condition to the recovery room.          Berg Vernetta    Date: 11/28/2023  Time: 2:25 PM

## 2023-11-28 NOTE — H&P (Addendum)
 History and Physical  Michelle Rojas FMW:[id number] DOB: 1981/11/01 DOA: 11/27/2023  PCP: Jhon Elveria DELENA, MD   Chief Complaint: Labia pain, swelling  HPI: Michelle Rojas is a 43 y.o. 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  Patient initially presented to the emergency department on 12/28 with left mons pain, swelling and had incision and drainage.  She was discharged home with a 7-day course of Bactrim  which she continued.  Since that time, she has had continued pain and swelling, denies any systemic symptoms such as fever or vomiting.  She feels that the site of infection never significantly improved, she has had swelling, drainage on and off.  Decided to come back to the ER for repeat evaluation.  Review of Systems: Please see HPI for pertinent positives and negatives. A complete 10 system review of systems are otherwise negative.  Past Medical History:  Diagnosis Date   AMA + Autoimmune hepatitis (HCC) 08/10/2023   Anxiety    PP treated with counseling no meds   Fibromyalgia    GERD (gastroesophageal reflux disease)    History of palpitations    negative work-up by cardiologist-- dr delford-- 2015   Hypertension    taking labetalol    Lupus (systemic lupus erythematosus) (HCC)    MCTD (mixed connective tissue disease) (HCC)    dx 07/ 2015--  rheumotologist--  dr jon jacob   Nausea    Numbness and tingling    Wears contact lenses    Past Surgical History:  Procedure Laterality Date   BIOPSY STOMACH     DILATION AND EVACUATION N/A 06/17/2016   Procedure: DILATATION AND EVACUATION;  Surgeon: Duwaine Blumenthal, DO;  Location: [facility];  Service: Gynecology;  Laterality: N/A;  WTIH SUCTION   LIVER BIOPSY     WISDOM TOOTH EXTRACTION  2012   Social History:  reports that she  has never smoked. She has never used smokeless tobacco. She reports current alcohol use of about 1.0 standard drink of alcohol per week. She reports that she does not use drugs.  No Known Allergies  Family History  Problem Relation Age of Onset   Hypertension Mother    Hypertension Father    Healthy Brother    Healthy Brother    Hypertension Maternal Grandmother    Healthy Daughter    Healthy Daughter    Colon cancer Neg Hx    Esophageal cancer Neg Hx    Stomach cancer Neg Hx      Prior to Admission medications   Medication Sig Start Date End Date Taking? Authorizing Provider  amLODipine  (NORVASC ) 10 MG tablet Take 10 mg by mouth daily. 08/08/23  Yes [provider]  atenolol  (TENORMIN ) 25 MG tablet Take 1 tablet by mouth daily. 10/17/23 10/16/24 Yes [provider]  baclofen  (LIORESAL ) 10 MG tablet Take 10 mg by mouth 3 (three) times daily. 10/17/23  Yes [provider]  BENLYSTA 200 MG/ML SOAJ Inject 200 mg into the skin once a week. 02/03/19  Yes [provider]  FIASP  100 UNIT/ML SOLN Inject 6 Units into the skin 3 (three) times daily as needed (high blood sugar). 10/23/23  Yes [provider]  fluticasone  (FLONASE ) 50 MCG/ACT nasal spray Place 1 spray into both nostrils daily as needed for allergies. 05/30/23  Yes [provider]  hydrOXYzine  (ATARAX ) 25 MG tablet  TAKE ONE TO TWO, AT BEDTIME AS NEEDED FOR ITCHING. Patient taking differently: Take 25 mg by mouth at bedtime. 09/20/23  Yes Avram Lupita BRAVO, MD  Insulin  Glargine (BASAGLAR  KWIKPEN) 100 UNIT/ML Inject 40 Units into the skin in the morning. 10/26/23 01/26/24 Yes [provider]  meclizine  (ANTIVERT ) 25 MG tablet Take 1 tablet (25 mg total) by mouth 3 (three) times daily as needed for dizziness. 12/13/21  Yes Venter, Margaux, PA-C  pantoprazole  (PROTONIX ) 40 MG tablet Take 1 tablet (40 mg total) by mouth daily. 08/18/23  Yes Amin, Sumayya, MD  predniSONE  (DELTASONE ) 20  MG tablet Take 2 tablets (40 mg total) by mouth daily with breakfast. Patient taking differently: Take 10 mg by mouth 2 (two) times daily with a meal. 08/17/23  Yes Caleen Qualia, MD  tacrolimus  (PROGRAF ) 1 MG capsule Take 2-3 mg by mouth 2 (two) times daily. Take 3 capsules (3 mg) in the morning and Take 2 capsules (2 mg) at bedtime   Yes [provider]  ursodiol  (ACTIGALL ) 300 MG capsule Take 2 capsules (600 mg total) by mouth 2 (two) times daily. 08/29/23  Yes Avram Lupita BRAVO, MD  valACYclovir (VALTREX) 500 MG tablet Take 500 mg by mouth as needed (fever blisters).   Yes [provider]  Vitamin D, Ergocalciferol, (DRISDOL) 1.25 MG (50000 UNIT) CAPS capsule Take 50,000 Units by mouth every 7 (seven) days. 03/01/21  Yes [provider]    Physical Exam: BP (!) 135/107 (BP Location: Right Arm)   Pulse 79   Temp 98.1 F (36.7 C) (Oral)   Resp 17   Ht 5' 6 (1.676 m)   Wt 93 kg   LMP 10/16/2023   SpO2 100%   BMI 33.09 kg/m  General:  Alert, oriented, calm, in no acute distress, walking around her room independently, looks very comfortable and nontoxic Cardiovascular: RRR, no murmurs or rubs, no peripheral edema  Respiratory: clear to auscultation bilaterally, no wheezes, no crackles  Abdomen: soft, nontender, nondistended, normal bowel tones heard  Skin: dry, no rashes  GU exam was deferred, as the patient was just recently seen by surgery Musculoskeletal: no joint effusions, normal range of motion  Psychiatric: appropriate affect, normal speech  Neurologic: extraocular muscles intact, clear speech, moving all extremities with intact sensorium         Labs on Admission:  Basic Metabolic Panel: Recent Labs  Lab 11/27/23 1242  NA 137  K 3.8  CL 104  CO2 25  GLUCOSE 236*  BUN 11  CREATININE 0.65  CALCIUM 9.0   Liver Function Tests: Recent Labs  Lab 11/27/23 1242  AST 14*  ALT 30  ALKPHOS 90  BILITOT 0.4  PROT 6.7  ALBUMIN 3.2*   No results  for input(s): LIPASE, AMYLASE in the last 168 hours. No results for input(s): AMMONIA in the last 168 hours. CBC: Recent Labs  Lab 11/27/23 1242  WBC 11.4*  NEUTROABS 6.5  HGB 11.1*  HCT 34.4*  MCV 85.1  PLT 321   Cardiac Enzymes: No results for input(s): CKTOTAL, CKMB, CKMBINDEX, TROPONINI in the last 168 hours. BNP (last 3 results) No results for input(s): BNP in the last 8760 hours.  ProBNP (last 3 results) No results for input(s): PROBNP in the last 8760 hours.  CBG: No results for input(s): GLUCAP in the last 168 hours.  Radiological Exams on Admission: CT PELVIS W CONTRAST Result Date: 11/28/2023 CLINICAL DATA:  Left groin abscess painful and draining. Recent incision and drainage. EXAM:  CT PELVIS WITH CONTRAST TECHNIQUE: Multidetector CT imaging of the pelvis was performed using the standard protocol following the bolus administration of intravenous contrast. RADIATION DOSE REDUCTION: This exam was performed according to the departmental dose-optimization program which includes automated exposure control, adjustment of the mA and/or kV according to patient size and/or use of iterative reconstruction technique. CONTRAST:  OMNIPAQUE  IOHEXOL  300 MG/ML  SOLN COMPARISON:  08/11/2023 FINDINGS: Urinary Tract:  No abnormality visualized. Bowel:  Unremarkable visualized pelvic bowel loops. Vascular/Lymphatic: No pathologically enlarged lymph nodes. No significant vascular abnormality seen. Reproductive: Fibroid uterus including exophytic/pedunculated fundal fibroid measuring 8.4 x 7.2 cm. This is not significantly changed since prior study. Other: No free fluid or free air. Subcutaneous stranding noted within the left groin. No well-defined drainable fluid collection to suggest abscess. This presumably reflects cellulitis. Musculoskeletal: No acute bony abnormality. IMPRESSION: Subcutaneous stranding and a small amount of gas within the subcutaneous soft tissues in  the left groin. No well-defined focal fluid collection to suggest drainable abscess. Fibroid uterus.  Large pedunculated anterior fibroid unchanged. Electronically Signed   By: Franky Crease M.D.   On: 11/28/2023 00:01   Assessment/Plan Michelle Rojas is a 43 y.o. 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   Labial abscess-patient is not septic, CT as above with subcutaneous stranding and a small amount of gas within the subcutaneous soft tissues of the left groin/labia.  She has already been seen by general surgery this morning, who plan incision and drainage in the OR. -Observation admission -Keep n.p.o. -Empiric IV vancomycin , Flagyl , cefepime -in the setting of immune suppression -Follow-up blood cultures  MCTD/lupus-continue chronic prednisone  20 mg p.o. daily and Prograf .  Currently patient is hemodynamically stable, we will plan on administering stress dose steroids in case of evidence of adrenal insufficiency.  GERD-continue oral PPI  Type 2 diabetes-check hemoglobin A1c, have continued her basal insulin  and a half dose of 20 units Basaglar  this morning.  Every 4 hours sliding scale insulin  in the meantime.  Transition to full dose basal bolus before every meal and at bedtime postoperatively.  Autoimmune hepatitis-continue high-dose prednisone   Hypertension-continue home Norvasc  and atenolol   Chronic pain-continue baclofen  10 mg p.o. 3 times daily  DVT prophylaxis: Lovenox      Code Status: Full Code  Consults called: General Surgery  Admission status: Observation  Time spent: 49 minutes  Bastien Strawser CHRISTELLA Gail MD [facility] Pager [phone]  If 7PM-7AM, please contact night-coverage [email or web address] Password [email or web address]  11/28/2023, 8:38 AM

## 2023-11-28 NOTE — Progress Notes (Signed)
 Pharmacy Antibiotic Note  Michelle Rojas is a 43 y.o. female admitted on 11/27/2023 with  wound infection .  Pharmacy has been consulted for vancomycin  and cefepime   dosing.  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   Pt is immunocompromised as she takes tacrolimus  and high dose prednisone    Patient initially presented to the emergency department on 12/28 with left mons pain, swelling and had incision and drainage. She was discharged home with a 7-day course of Bactrim  which she continued without improvement.    Plan: Cefepime  2 gr IV q8h Vancomycin  2000 mg IV x1 given in ED, then vancomycin  1000 mg IV q12h ( AUC 476, Scr 0.65 mg/dl, listed wet 93 kg)  Metronidazole  500 mg IV q12h   Height: 5' 6 (167.6 cm) Weight: 93 kg (205 lb) IBW/kg (Calculated) : 59.3  Temp (24hrs), Avg:98.3 F (36.8 C), Min:98.1 F (36.7 C), Max:98.5 F (36.9 C)  Recent Labs  Lab 11/27/23 1242 11/27/23 1252 11/27/23 2219  WBC 11.4*  --   --   CREATININE 0.65  --   --   LATICACIDVEN  --  0.6 0.9    Estimated Creatinine Clearance: 103.1 mL/min (by C-G formula based on SCr of 0.65 mg/dL).    No Known Allergies  Antimicrobials this admission: 1/6 cefepime  >>  1/6 vancomycin  >>  1/6 metronidazole  >>   Dose adjustments this admission:   Microbiology results: 1/6 BCx:      Dolphus Roller, PharmD, BCPS 11/28/2023 8:57 AM

## 2023-11-28 NOTE — Progress Notes (Signed)
 Due to 2u novolog given at 1044 (also 20u semglee given at 1045) MD requests recheck CBG at 1345.  Glycemic protocol not ordered, per MD consultation.

## 2023-11-28 NOTE — Plan of Care (Signed)
  Problem: Education: Goal: Ability to describe self-care measures that may prevent or decrease complications (Diabetes Survival Skills Education) will improve Outcome: Progressing   Problem: Education: Goal: Knowledge of General Education information will improve Description: Including pain rating scale, medication(s)/side effects and non-pharmacologic comfort measures Outcome: Progressing

## 2023-11-28 NOTE — Progress Notes (Signed)
 Dr Magnus Ivan at bedside and states we may proceed with surgery without pregnancy test.  Pt states "there is no way I could be pregnant"

## 2023-11-29 ENCOUNTER — Other Ambulatory Visit (HOSPITAL_COMMUNITY): Payer: Self-pay

## 2023-11-29 ENCOUNTER — Telehealth (HOSPITAL_COMMUNITY): Payer: Self-pay | Admitting: Pharmacy Technician

## 2023-11-29 ENCOUNTER — Encounter (HOSPITAL_COMMUNITY): Payer: Self-pay | Admitting: Surgery

## 2023-11-29 LAB — BASIC METABOLIC PANEL
Anion gap: 9 (ref 5–15)
BUN: 13 mg/dL (ref 6–20)
CO2: 26 mmol/L (ref 22–32)
Calcium: 9 mg/dL (ref 8.9–10.3)
Chloride: 100 mmol/L (ref 98–111)
Creatinine, Ser: 0.73 mg/dL (ref 0.44–1.00)
GFR, Estimated: >60 mL/min (ref >60)
Glucose, Bld: 275 mg/dL — ABNORMAL HIGH (ref 70–99)
Potassium: 3.6 mmol/L (ref 3.5–5.1)
Sodium: 135 mmol/L (ref 135–145)

## 2023-11-29 LAB — HEMOGLOBIN A1C
Hgb A1c MFr Bld: 13 % — ABNORMAL HIGH (ref 4.8–5.6)
Mean Plasma Glucose: 326 mg/dL

## 2023-11-29 LAB — CBC
HCT: 34.6 % — ABNORMAL LOW (ref 36.0–46.0)
Hemoglobin: 10.9 g/dL — ABNORMAL LOW (ref 12.0–15.0)
MCH: 27 pg (ref 26.0–34.0)
MCHC: 31.5 g/dL (ref 30.0–36.0)
MCV: 85.6 fL (ref 80.0–100.0)
Platelets: 341 10*3/uL (ref 150–400)
RBC: 4.04 MIL/uL (ref 3.87–5.11)
RDW: 14.6 % (ref 11.5–15.5)
WBC: 9.4 10*3/uL (ref 4.0–10.5)
nRBC: 0.4 % — ABNORMAL HIGH (ref 0.0–0.2)

## 2023-11-29 LAB — GLUCOSE, CAPILLARY
Glucose-Capillary: 122 mg/dL — ABNORMAL HIGH (ref 70–99)
Glucose-Capillary: 174 mg/dL — ABNORMAL HIGH (ref 70–99)
Glucose-Capillary: 228 mg/dL — ABNORMAL HIGH (ref 70–99)
Glucose-Capillary: 236 mg/dL — ABNORMAL HIGH (ref 70–99)
Glucose-Capillary: 275 mg/dL — ABNORMAL HIGH (ref 70–99)
Glucose-Capillary: 89 mg/dL (ref 70–99)

## 2023-11-29 MED ORDER — HYDROCODONE-ACETAMINOPHEN 5-325 MG PO TABS: 1.0000 | ORAL_TABLET | Freq: Four times a day (QID) | ORAL | 0 refills | Status: AC | PRN

## 2023-11-29 MED ORDER — INSULIN ASPART 100 UNIT/ML IJ SOLN
0.0000 [IU] | Freq: Three times a day (TID) | INTRAMUSCULAR | Status: DC
  Administered 2023-11-29: 2 [IU] via SUBCUTANEOUS
  Administered 2023-11-29: 5 [IU] via SUBCUTANEOUS
  Administered 2023-11-30: 8 [IU] via SUBCUTANEOUS

## 2023-11-29 MED ORDER — INSULIN GLARGINE-YFGN 100 UNIT/ML ~~LOC~~ SOLN
40.0000 [IU] | Freq: Every day | SUBCUTANEOUS | Status: DC
  Administered 2023-11-30: 40 [IU] via SUBCUTANEOUS
  Filled 2023-11-29: qty 0.4

## 2023-11-29 MED ORDER — LIVING WELL WITH DIABETES BOOK
Freq: Once | Status: AC
  Filled 2023-11-29: qty 1

## 2023-11-29 NOTE — Progress Notes (Signed)
 Progress Note  1 Day Post-Op  Subjective: CC: having some soreness at incisions which is well controlled with pain medications. No other complaints   Objective: Vital signs in last 24 hours: Temp:  [98.2 F (36.8 C)-98.6 F (37 C)] 98.6 F (37 C) (01/08 0551) Pulse Rate:  [64-83] 75 (01/08 0551) Resp:  [15-19] 16 (01/08 0551) BP: (130-184)/(90-106) 131/99 (01/08 0551) SpO2:  [92 %-100 %] 100 % (01/08 0551) Last BM Date : 11/28/23  Intake/Output from previous day: 01/07 0701 - 01/08 0700 In: 1759.6 [P.O.:360; I.V.:200; IV Piggyback:1199.6] Out: 405 [Urine:400; Blood:5] Intake/Output this shift: Total I/O In: -  Out: 100 [Urine:100]  PE: General: pleasant, WD, female who is laying in bed in NAD Lungs:  Respiratory effort nonlabored MSK: all 4 extremities are symmetrical with no cyanosis, clubbing, or edema. Skin: warm and dry. Labial incision bloody packing removed - no active drainage. Some mild surrounding induration. No erythema. Mons incision with bloody purulent tinged packing removed with some bloody drainage. Mild surrounding induration. No erythema Psych: A&Ox3 with an appropriate affect.    Lab Results:  Recent Labs    11/27/23 1242 11/29/23 0448  WBC 11.4* 9.4  HGB 11.1* 10.9*  HCT 34.4* 34.6*  PLT 321 341   BMET Recent Labs    11/27/23 1242 11/29/23 0448  NA 137 135  K 3.8 3.6  CL 104 100  CO2 25 26  GLUCOSE 236* 275*  BUN 11 13  CREATININE 0.65 0.73  CALCIUM 9.0 9.0   PT/INR No results for input(s): LABPROT, INR in the last 72 hours. CMP     Component Value Date/Time   NA 135 11/29/2023 0448   K 3.6 11/29/2023 0448   CL 100 11/29/2023 0448   CO2 26 11/29/2023 0448   GLUCOSE 275 (H) 11/29/2023 0448   BUN 13 11/29/2023 0448   CREATININE 0.73 11/29/2023 0448   CALCIUM 9.0 11/29/2023 0448   PROT 6.7 11/27/2023 1242   ALBUMIN 3.2 (L) 11/27/2023 1242   AST 14 (L) 11/27/2023 1242   ALT 30 11/27/2023 1242   ALKPHOS 90 11/27/2023  1242   BILITOT 0.4 11/27/2023 1242   GFRNONAA >60 11/29/2023 0448   GFRAA >60 09/02/2017 1618   Lipase     Component Value Date/Time   LIPASE 21 08/10/2023 1610       Studies/Results: CT PELVIS W CONTRAST Result Date: 11/28/2023 CLINICAL DATA:  Left groin abscess painful and draining. Recent incision and drainage. EXAM: CT PELVIS WITH CONTRAST TECHNIQUE: Multidetector CT imaging of the pelvis was performed using the standard protocol following the bolus administration of intravenous contrast. RADIATION DOSE REDUCTION: This exam was performed according to the departmental dose-optimization program which includes automated exposure control, adjustment of the mA and/or kV according to patient size and/or use of iterative reconstruction technique. CONTRAST:  OMNIPAQUE  IOHEXOL  300 MG/ML  SOLN COMPARISON:  08/11/2023 FINDINGS: Urinary Tract:  No abnormality visualized. Bowel:  Unremarkable visualized pelvic bowel loops. Vascular/Lymphatic: No pathologically enlarged lymph nodes. No significant vascular abnormality seen. Reproductive: Fibroid uterus including exophytic/pedunculated fundal fibroid measuring 8.4 x 7.2 cm. This is not significantly changed since prior study. Other: No free fluid or free air. Subcutaneous stranding noted within the left groin. No well-defined drainable fluid collection to suggest abscess. This presumably reflects cellulitis. Musculoskeletal: No acute bony abnormality. IMPRESSION: Subcutaneous stranding and a small amount of gas within the subcutaneous soft tissues in the left groin. No well-defined focal fluid collection to suggest drainable abscess. Fibroid uterus.  Large pedunculated anterior fibroid unchanged. Electronically Signed   By: Franky Crease M.D.   On: 11/28/2023 00:01    Anti-infectives: Anti-infectives (From admission, onward)    Start     Dose/Rate Route Frequency Ordered Stop   11/28/23 1000  piperacillin -tazobactam (ZOSYN ) IVPB 3.375 g  Status:   Discontinued        3.375 g 12.5 mL/hr over 240 Minutes Intravenous Every 8 hours 11/28/23 0807 11/28/23 0832   11/28/23 1000  metroNIDAZOLE  (FLAGYL ) IVPB 500 mg        500 mg 100 mL/hr over 60 Minutes Intravenous Every 12 hours 11/28/23 0832     11/28/23 1000  ceFEPIme  (MAXIPIME ) 2 g in sodium chloride  0.9 % 100 mL IVPB        2 g 200 mL/hr over 30 Minutes Intravenous Every 8 hours 11/28/23 0854     11/28/23 1000  vancomycin  (VANCOCIN ) IVPB 1000 mg/200 mL premix        1,000 mg 200 mL/hr over 60 Minutes Intravenous Every 12 hours 11/28/23 0854     11/27/23 2200  vancomycin  (VANCOREADY) IVPB 2000 mg/400 mL        2,000 mg 200 mL/hr over 120 Minutes Intravenous  Once 11/27/23 2154 11/28/23 0231   11/27/23 2145  ceFEPIme  (MAXIPIME ) 2 g in sodium chloride  0.9 % 100 mL IVPB        2 g 200 mL/hr over 30 Minutes Intravenous  Once 11/27/23 2142 11/28/23 0230   11/27/23 2145  vancomycin  (VANCOCIN ) IVPB 1000 mg/200 mL premix  Status:  Discontinued        1,000 mg 200 mL/hr over 60 Minutes Intravenous  Once 11/27/23 2142 11/27/23 2153   11/27/23 2145  metroNIDAZOLE  (FLAGYL ) IVPB 500 mg        500 mg 100 mL/hr over 60 Minutes Intravenous  Once 11/27/23 2142 11/28/23 0514        Assessment/Plan Mons/labial abscess POD1 s/p IRRIGATION AND DEBRIDEMENT MONS AND LABIAL ABSCESS    Cultures pending. WBC normalized Stable for discharge on PO abx. Follow up scheduled. Will put in wound care instructions and send pain medications  FEN: CM ID: vanc, flagyl , maxipime  VTE: lovenox   I reviewed hospitalist notes, last 24 h vitals and pain scores, last 48 h intake and output, last 24 h labs and trends, and last 24 h imaging results.    LOS: 1 day   [place] VEAR Mais, [facility] Surgery 11/29/2023, 9:36 AM Please see Amion for pager number during day hours 7:00am-4:30pm

## 2023-11-29 NOTE — TOC CM/SW Note (Signed)
 Transition of Care [facility]) - Inpatient Brief Assessment   Patient Details  Name: Michelle Rojas MRN: [id number] Date of Birth: [birth year]  [facility] CM/SW Contact:    Alfonse JONELLE Rex, RN Phone Number: 11/29/2023, 12:40 PM   Clinical Narrative: Met with pt at bedside to introduce  role of TOC/NCM and review for dc planning, pt reports she has an established PCP and pharmacy, no current home care services, uses a cane as needed for functional mobility, pt reports she feels safe returning home with support from her family, confirmed transportation is available at discharge.  No insurance on file, pt reports she has [employer] and provided copy of insurance card on admission. TOC Brief Assessment completed. No TOC needs identified at this time.     Transition of Care Asessment:

## 2023-11-29 NOTE — Hospital Course (Signed)
 Michelle Rojas is a 43 y.o. 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  She was discharged home with a 7-day course of Bactrim  but despite continuing antibiotics she experienced ongoing pain and swelling without fevers show see decided come back to the ED for repeat evaluation.  In the ED vitals were stable.  Labs showed mild leukocytosis at 11.4 with glucose elevation at 236.  CRP was elevated at 1.5.  Lactate 0.9.  HIV was nonreactive. CT with subcutaneous stranding and a small amount of gas within the subcutaneous soft tissues of the left groin/labia.  Patient was then admitted hospital for further evaluation and treatment.    Labial abscess- Due to concerning CT scan findings patient was seen by general surgery and underwent irrigation and debridement of  mons/ labial abscess on 11/28/2023.  Currently on cefepime  Vanco and Flagyl  in the setting of immunosuppression.  Blood cultures negative in 2 days.  Abscess culture no growth in less than 24 hours   MCTD/lupus continue chronic prednisone  20 mg p.o. daily and Prograf .    GERD-continue PPI.   Type 2 diabetes mellitus with hyperglycemia -  Uncontrolled.  Will resume home insulin  regimen.   Autoimmune hepatitis-continue high-dose prednisone    Hypertension-continue Norvasc  and atenolol    Chronic pain-continue baclofen  10 mg p.o. 3 times daily

## 2023-11-29 NOTE — Inpatient Diabetes Management (Signed)
 Inpatient Diabetes Program Recommendations  AACE/ADA: New Consensus Statement on Inpatient Glycemic Control (2015)  Target Ranges:  Prepandial:   less than 140 mg/dL      Peak postprandial:   less than 180 mg/dL (1-2 hours)      Critically ill patients:  140 - 180 mg/dL   Lab Results  Component Value Date   GLUCAP 122 (H) 11/29/2023   HGBA1C 13.0 (H) 11/28/2023    Review of Glycemic Control  Latest Reference Range & Units 11/28/23 13:42 11/28/23 16:43 11/28/23 20:21 11/29/23 00:13 11/29/23 04:04 11/29/23 07:13 11/29/23 11:42  Glucose-Capillary 70 - 99 mg/dL 852 (H) 821 (H) 844 (H) 228 (H) 275 (H) 174 (H) 122 (H)  (H): Data is abnormally high  Diabetes history: DM2 Outpatient Diabetes medications: Basaglar  40 units every day, Fiasp  6 units TID Current orders for Inpatient glycemic control: Semglee  40 units every day, Novolog  0-15 units TID and at bedtime, Prednisone  10 mg BID  Inpatient Diabetes Program Recommendations:    Met with patient at bedside. Reviewed patient's current A1c of 13%. Explained what a A1c is and what it measures. Also reviewed goal A1c with patient, importance of good glucose control @ home, and blood sugar goals.    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  She checks her glucose with a glucometer/finger sticks.  She takes her insulins as prescribed and is current with her PCP.    Explained importance of getting her A1C closer to 7% (CBGs around 150 mg/dL).  She avoids caloric beverages.  Ordered the LWWD booklet and it is at bedside.    Educated on The Plate Method, CHO's, portion control, CBGs at home fasting and mid afternoon, F/U with PCP every 3 months, bring meter to PCP office, long and short term complications of uncontrolled BG, and importance of exercise.  She states she pays $85 for her Fiasp .  I printed off a coupon card and provided her with it to see if this can bring the cost down.  If not Novolog  is covered at $39 per our  pharmacy benefit check.    She is interested in a CGM.  The Dexcom is $112/month.  She would do better with the [facility] 3 as it has a built in coupon of no more than $75/month.  Will place a FSL on her arm prior to DC.  She has downloaded the app on her IPhone.    Will continue to follow while inpatient.  Thank you, Wyvonna Pinal, MSN, CDCES Diabetes Coordinator Inpatient Diabetes Program [phone] (team pager from 8a-5p)

## 2023-11-29 NOTE — Plan of Care (Signed)
   Problem: Education: Goal: Ability to describe self-care measures that may prevent or decrease complications (Diabetes Survival Skills Education) will improve Outcome: Progressing Goal: Individualized Educational Video(s) Outcome: Progressing

## 2023-11-29 NOTE — Progress Notes (Signed)
 PROGRESS NOTE  YAZAIRA SPEAS FMW:[id number] DOB: [birth year] DOA: 11/27/2023 PCP: Jhon Elveria DELENA, MD   LOS: 1 day   Brief narrative:  Michelle Rojas is a 43 y.o. 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  She was discharged home with a 7-day course of Bactrim  but despite continuing antibiotics she experienced ongoing pain and swelling without fevers show see decided come back to the ED for repeat evaluation.  In the ED vitals were stable.  Labs showed mild leukocytosis at 11.4 with glucose elevation at 236.  CRP was elevated at 1.5.  Lactate 0.9.  HIV was nonreactive. CT with subcutaneous stranding and a small amount of gas within the subcutaneous soft tissues of the left groin/labia.  Patient was then admitted hospital for further evaluation and treatment.   Assessment/Plan: Principal Problem:   Vulvar abscess  Labial abscess- Due to concerning CT scan findings, patient was seen by general surgery and underwent irrigation and debridement of  mons/ labial abscess on 11/28/2023.  Currently on cefepime  Vanco and Flagyl  in the setting of immunosuppression.  Blood cultures negative in 2 days.  Abscess culture no growth in less than 24 hours seen by general surgery today.  Will continue to follow cultures.  Seen by general surgery today and plan for IV antibiotics during hospitalization and transition to oral on discharge.   MCTD/lupus continue chronic prednisone  20 mg p.o. daily and Prograf .    GERD-continue PPI.   Type 2 diabetes mellitus with hyperglycemia -  Uncontrolled.  Will resume home insulin  regimen.  Will need adequate diabetes control on discharge.  Latest hemoglobin A1c of 13.0.   Autoimmune hepatitis-continue high-dose prednisone    Hypertension-continue  Norvasc  and atenolol    Chronic pain-continue baclofen  10 mg p.o. 3 times daily   DVT prophylaxis: enoxaparin  (LOVENOX ) injection 40 mg Start: 11/28/23 1000 SCDs Start: 11/28/23 0836   Disposition: Home likely on 1//925.  Status is: Inpatient Remains inpatient appropriate because: IV antibiotic, pending cultures    Code Status:     Code Status: Full Code  Family Communication: Spoke with the patient's daughter at bedside  Consultants: General surgery  Procedures: Irrigation and Debridement of mons abscess on 11/27/22  Anti-infectives:  Vancomycin , flagyl , metro  Anti-infectives (From admission, onward)    Start     Dose/Rate Route Frequency Ordered Stop   11/28/23 1000  piperacillin -tazobactam (ZOSYN ) IVPB 3.375 g  Status:  Discontinued        3.375 g 12.5 mL/hr over 240 Minutes Intravenous Every 8 hours 11/28/23 0807 11/28/23 0832   11/28/23 1000  metroNIDAZOLE  (FLAGYL ) IVPB 500 mg        500 mg 100 mL/hr over 60 Minutes Intravenous Every 12 hours 11/28/23 0832     11/28/23 1000  ceFEPIme  (MAXIPIME ) 2 g in sodium chloride  0.9 % 100 mL IVPB        2 g 200 mL/hr over 30 Minutes Intravenous Every 8 hours 11/28/23 0854     11/28/23 1000  vancomycin  (VANCOCIN ) IVPB 1000 mg/200 mL premix        1,000 mg 200 mL/hr over 60 Minutes Intravenous Every 12 hours 11/28/23 0854     11/27/23 2200  vancomycin  (VANCOREADY) IVPB 2000 mg/400 mL        2,000 mg 200 mL/hr over 120 Minutes Intravenous  Once 11/27/23 2154  11/28/23 0231   11/27/23 2145  ceFEPIme  (MAXIPIME ) 2 g in sodium chloride  0.9 % 100 mL IVPB        2 g 200 mL/hr over 30 Minutes Intravenous  Once 11/27/23 2142 11/28/23 0230   11/27/23 2145  vancomycin  (VANCOCIN ) IVPB 1000 mg/200 mL premix  Status:  Discontinued        1,000 mg 200 mL/hr over 60 Minutes Intravenous  Once 11/27/23 2142 11/27/23 2153   11/27/23 2145  metroNIDAZOLE  (FLAGYL ) IVPB 500 mg        500 mg 100 mL/hr over 60 Minutes Intravenous  Once 11/27/23  2142 11/28/23 0514        Subjective: Today, patient was seen and examined at bedside.  Patient says pain is better, discussed about poor diabetes control. No fever or chills. Feels tired and fatigued  Objective: Vitals:   11/29/23 0015 11/29/23 0551  BP: (!) 141/92 (!) 131/99  Pulse: 66 75  Resp: 16 16  Temp: 98.5 F (36.9 C) 98.6 F (37 C)  SpO2: 99% 100%    Intake/Output Summary (Last 24 hours) at 11/29/2023 1020 Last data filed at 11/29/2023 0836 Gross per 24 hour  Intake 1759.64 ml  Output 505 ml  Net 1254.64 ml   Filed Weights   11/27/23 1232  Weight: 93 kg   Body mass index is 33.09 kg/m.   Physical Exam:  GENERAL: obese, Patient is alert awake and oriented. Not in obvious distress. HENT: No scleral pallor or icterus. Pupils equally reactive to light. Oral mucosa is moist NECK: is supple, no gross swelling noted. CHEST: Clear to auscultation. No crackles or wheezes.  Diminished breath sounds bilaterally. CVS: S1 and S2 heard, no murmur. Regular rate and rhythm.  ABDOMEN: Soft, non-tender, bowel sounds are present.  Incision site dressed  EXTREMITIES: No edema. CNS: Cranial nerves are intact. No focal motor deficits. SKIN: warm and dry without rashes.  Labial incision.  Data Review: I have personally reviewed the following laboratory data and studies,  CBC: Recent Labs  Lab 11/27/23 1242 11/29/23 0448  WBC 11.4* 9.4  NEUTROABS 6.5  --   HGB 11.1* 10.9*  HCT 34.4* 34.6*  MCV 85.1 85.6  PLT 321 341   Basic Metabolic Panel: Recent Labs  Lab 11/27/23 1242 11/29/23 0448  NA 137 135  K 3.8 3.6  CL 104 100  CO2 25 26  GLUCOSE 236* 275*  BUN 11 13  CREATININE 0.65 0.73  CALCIUM 9.0 9.0   Liver Function Tests: Recent Labs  Lab 11/27/23 1242  AST 14*  ALT 30  ALKPHOS 90  BILITOT 0.4  PROT 6.7  ALBUMIN 3.2*   No results for input(s): LIPASE, AMYLASE in the last 168 hours. No results for input(s): AMMONIA in the last 168  hours. Cardiac Enzymes: No results for input(s): CKTOTAL, CKMB, CKMBINDEX, TROPONINI in the last 168 hours. BNP (last 3 results) No results for input(s): BNP in the last 8760 hours.  ProBNP (last 3 results) No results for input(s): PROBNP in the last 8760 hours.  CBG: Recent Labs  Lab 11/28/23 1643 11/28/23 2021 11/29/23 0013 11/29/23 0404 11/29/23 0713  GLUCAP 178* 155* 228* 275* 174*   Recent Results (from the past 240 hours)  Blood culture (routine x 2)     Status: None (Preliminary result)   Collection Time: 11/27/23 10:08 PM   Specimen: BLOOD RIGHT ARM  Result Value Ref Range Status   Specimen Description   Final    BLOOD RIGHT ARM  Performed at [facility], [address] [place], [place] [address]    Special Requests   Final    BOTTLES DRAWN AEROBIC AND ANAEROBIC Blood Culture results may not be optimal due to an inadequate volume of blood received in culture bottles Performed at [facility], [address], [place], [place] [address]    Culture   Final    NO GROWTH 2 DAYS Performed at [facility], [address], [place], [place] [address]    Report Status PENDING  Incomplete  Blood culture (routine x 2)     Status: None (Preliminary result)   Collection Time: 11/27/23 10:25 PM   Specimen: BLOOD LEFT FOREARM  Result Value Ref Range Status   Specimen Description   Final    BLOOD LEFT FOREARM Performed at [facility], [address], [place], [place] [address]    Special Requests   Final    BOTTLES DRAWN AEROBIC AND ANAEROBIC Blood Culture results may not be optimal due to an inadequate volume of blood received in culture bottles Performed at [facility], [address] [place], [place] [address]    Culture   Final    NO GROWTH 2 DAYS Performed at [facility], [address] [place], [place] [address]    Report Status PENDING  Incomplete   Aerobic/Anaerobic Culture w Gram Stain (surgical/deep wound)     Status: None (Preliminary result)   Collection Time: 11/28/23  2:16 PM   Specimen: PATH Soft tissue  Result Value Ref Range Status   Specimen Description   Final    ABSCESS Performed at [facility], [address], [place], [place] [address]    Special Requests   Final    NONE Performed at [facility], [address], [place], [place] [address]    Gram Stain   Final    WBC PRESENT, PREDOMINANTLY PMN WBC PRESENT, PREDOMINANTLY PMN NO ORGANISMS SEEN    Culture   Final    NO GROWTH < 24 HOURS Performed at [facility], [address], [place], [place] [address]    Report Status PENDING  Incomplete     Studies: CT PELVIS W CONTRAST Result Date: 11/28/2023 CLINICAL DATA:  Left groin abscess painful and draining. Recent incision and drainage. EXAM: CT PELVIS WITH CONTRAST TECHNIQUE: Multidetector CT imaging of the pelvis was performed using the standard protocol following the bolus administration of intravenous contrast. RADIATION DOSE REDUCTION: This exam was performed according to the departmental dose-optimization program which includes automated exposure control, adjustment of the mA and/or kV according to patient size and/or use of iterative reconstruction technique. CONTRAST:  OMNIPAQUE  IOHEXOL  300 MG/ML  SOLN COMPARISON:  08/11/2023 FINDINGS: Urinary Tract:  No abnormality visualized. Bowel:  Unremarkable visualized pelvic bowel loops. Vascular/Lymphatic: No pathologically enlarged lymph nodes. No significant vascular abnormality seen. Reproductive: Fibroid uterus including exophytic/pedunculated fundal fibroid measuring 8.4 x 7.2 cm. This is not significantly changed since prior study. Other: No free fluid or free air. Subcutaneous stranding noted within the left groin. No well-defined drainable fluid collection to suggest abscess. This presumably reflects cellulitis. Musculoskeletal:  No acute bony abnormality. IMPRESSION: Subcutaneous stranding and a small amount of gas within the subcutaneous soft tissues in the left groin. No well-defined focal fluid collection to suggest drainable abscess. Fibroid uterus.  Large pedunculated anterior fibroid unchanged. Electronically Signed   By: Franky Crease M.D.   On:  11/28/2023 00:01      Vernal Alstrom, MD  [facility] 11/29/2023  If 7PM-7AM, please contact night-coverage

## 2023-11-29 NOTE — Discharge Instructions (Addendum)
 Packing from labial incision can be removed tomorrow morning and does not need to be repacked.  upper incision will need twice daily dressing changes. Using normal saline moistened gauze pack the wound to surface and cover with pad or gauze. You may shower with wounds uncovered and unpacked. Replace packing after shower. No bathing/swimming until cleared at follow up

## 2023-11-29 NOTE — Telephone Encounter (Signed)
 Patient Product/process Development Scientist completed.    The patient is insured through [facility]. Patient has [employer], may use a copay card, and/or apply for patient assistance if available.    Ran test claim for Novolog  Pen and the current 30 day co-pay is $39.92.  Ran test claim for Dexcom G7 Sensor and the current 30 day co-pay is $112.02.  Ran test claim for Humalog Pen and Not on Formulary  Ran test claim for [employer] and Not on Formulary  This test claim was processed through [employer]- copay amounts may vary at other pharmacies due to [employer], or as the patient moves through the different stages of their insurance plan.     Reyes Sharps, CPHT Pharmacy Technician III Certified Patient Advocate [facility] Patient Advocate Team Direct Number: [phone]  Fax: [phone]

## 2023-11-30 LAB — BASIC METABOLIC PANEL
Anion gap: 11 (ref 5–15)
BUN: 16 mg/dL (ref 6–20)
CO2: 22 mmol/L (ref 22–32)
Calcium: 8.8 mg/dL — ABNORMAL LOW (ref 8.9–10.3)
Chloride: 100 mmol/L (ref 98–111)
Creatinine, Ser: 0.74 mg/dL (ref 0.44–1.00)
GFR, Estimated: >60 mL/min (ref >60)
Glucose, Bld: 278 mg/dL — ABNORMAL HIGH (ref 70–99)
Potassium: 3.9 mmol/L (ref 3.5–5.1)
Sodium: 133 mmol/L — ABNORMAL LOW (ref 135–145)

## 2023-11-30 LAB — CBC
HCT: 36.4 % (ref 36.0–46.0)
Hemoglobin: 11.5 g/dL — ABNORMAL LOW (ref 12.0–15.0)
MCH: 27 pg (ref 26.0–34.0)
MCHC: 31.6 g/dL (ref 30.0–36.0)
MCV: 85.4 fL (ref 80.0–100.0)
Platelets: 362 10*3/uL (ref 150–400)
RBC: 4.26 MIL/uL (ref 3.87–5.11)
RDW: 14.8 % (ref 11.5–15.5)
WBC: 8.7 10*3/uL (ref 4.0–10.5)
nRBC: 0.3 % — ABNORMAL HIGH (ref 0.0–0.2)

## 2023-11-30 LAB — GLUCOSE, CAPILLARY
Glucose-Capillary: 193 mg/dL — ABNORMAL HIGH (ref 70–99)
Glucose-Capillary: 266 mg/dL — ABNORMAL HIGH (ref 70–99)

## 2023-11-30 MED ORDER — AMOXICILLIN-POT CLAVULANATE 875-125 MG PO TABS: 1.0000 | ORAL_TABLET | Freq: Two times a day (BID) | ORAL | 0 refills | Status: AC

## 2023-11-30 MED ORDER — DOXYCYCLINE HYCLATE 100 MG PO TABS: 100.0000 mg | ORAL_TABLET | Freq: Two times a day (BID) | ORAL | 0 refills | Status: AC

## 2023-11-30 NOTE — Discharge Summary (Signed)
 Physician Discharge Summary  Michelle Rojas FMW:[id number] DOB: 06-26-1981 DOA: 11/27/2023  PCP: Jhon Elveria DELENA, MD  Admit date: 11/27/2023 Discharge date: 11/30/2023  Admitted From: Home  Discharge disposition: Home   Recommendations for Outpatient Follow-Up:   Follow up with your primary care provider in one week.  Check CBC, BMP, magnesium  in the next visit Follow-up with the general surgery team as has been scheduled.   Discharge Diagnosis:   Principal Problem:   Vulvar abscess   Discharge Condition: Improved.  Diet recommendation:   Carbohydrate-modified.   Wound care: As per instructions.  Code status: Full.   History of Present Illness:   Michelle Rojas is a 43 y.o. 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  She was discharged home with a 7-day course of Bactrim  but despite continuing antibiotics she experienced ongoing pain and swelling without fevers show see decided come back to the ED for repeat evaluation.  In the ED vitals were stable.  Labs showed mild leukocytosis at 11.4 with glucose elevation at 236.  CRP was elevated at 1.5.  Lactate 0.9.  HIV was nonreactive. CT with subcutaneous stranding and a small amount of gas within the subcutaneous soft tissues of the left groin/labia.  Patient was then admitted hospital for further evaluation and treatment.   Hospital Course:   Following conditions were addressed during hospitalization as listed below,  Labial abscess- Due to concerning CT scan findings, patient was seen by general surgery and underwent irrigation and debridement of  mons/ labial abscess on 11/28/2023.  Patient received cefepime  Vanco and Flagyl  in the setting of immunosuppression.  Blood cultures negative in 2 days.  Abscess  culture no growth i so far.  General surgery has seen the patient and recommend oral antibiotics and discharged with outpatient follow-up.  At this time we will transition to Augmentin  and doxycycline  for next 7 days to complete the course.    MCTD/lupus continue chronic prednisone   and Prograf .    GERD-continue Protonix .   Type 2 diabetes mellitus with hyperglycemia -  Uncontrolled.   Latest hemoglobin A1c of 13.0.  Will continue insulin  regimen on discharge.   Autoimmune hepatitis-continue prednisone    Hypertension-continue Norvasc  and atenolol    Chronic pain-continue baclofen   Disposition.  At this time, patient is stable for disposition home with outpatient PCP and surgical follow-up  Medical Consultants:   General surgery  Procedures:    Irrigation and Debridement of mons abscess on 11/27/22  Subjective:   Today, patient was seen and examined at bedside.  Feels okay.  Denies fever chills pain nausea vomiting.  Discharge Exam:   Vitals:   11/29/23 2304 11/30/23 0609  BP: (!) 129/92 130/83  Pulse: 84 100  Resp: 18 16  Temp: 98.2 F (36.8 C) 98.3 F (36.8 C)  SpO2: 100% 98%   Vitals:   11/29/23 0551 11/29/23 1141 11/29/23 2304 11/30/23 0609  BP: (!) 131/99 (!) 155/103 (!) 129/92 130/83  Pulse: 75 75 84 100  Resp: 16 16 18 16   Temp: 98.6 F (37 C) 98.3 F (36.8 C) 98.2 F (36.8 C) 98.3 F (36.8 C)  TempSrc: Oral Oral Oral   SpO2: 100% 100% 100% 98%  Weight:      Height:       Body mass index is 33.09 kg/m.  General: Alert  awake, not in obvious distress, obese built HENT: pupils equally reacting to light,  No scleral pallor or icterus noted. Oral mucosa is moist.  Chest:  Clear breath sounds.  Diminished breath sounds bilaterally. No crackles or wheezes.  CVS: S1 &S2 heard. No murmur.  Regular rate and rhythm. Abdomen: Soft, nontender, nondistended.  Bowel sounds are heard.   Extremities: No cyanosis, clubbing or edema.  Peripheral pulses are  palpable. Psych: Alert, awake and oriented, normal mood CNS:  No cranial nerve deficits.  Power equal in all extremities.   Skin: Warm and dry.  Labial incision.  The results of significant diagnostics from this hospitalization (including imaging, microbiology, ancillary and laboratory) are listed below for reference.     Diagnostic Studies:   CT PELVIS W CONTRAST Result Date: 11/28/2023 CLINICAL DATA:  Left groin abscess painful and draining. Recent incision and drainage. EXAM: CT PELVIS WITH CONTRAST TECHNIQUE: Multidetector CT imaging of the pelvis was performed using the standard protocol following the bolus administration of intravenous contrast. RADIATION DOSE REDUCTION: This exam was performed according to the departmental dose-optimization program which includes automated exposure control, adjustment of the mA and/or kV according to patient size and/or use of iterative reconstruction technique. CONTRAST:  OMNIPAQUE  IOHEXOL  300 MG/ML  SOLN COMPARISON:  08/11/2023 FINDINGS: Urinary Tract:  No abnormality visualized. Bowel:  Unremarkable visualized pelvic bowel loops. Vascular/Lymphatic: No pathologically enlarged lymph nodes. No significant vascular abnormality seen. Reproductive: Fibroid uterus including exophytic/pedunculated fundal fibroid measuring 8.4 x 7.2 cm. This is not significantly changed since prior study. Other: No free fluid or free air. Subcutaneous stranding noted within the left groin. No well-defined drainable fluid collection to suggest abscess. This presumably reflects cellulitis. Musculoskeletal: No acute bony abnormality. IMPRESSION: Subcutaneous stranding and a small amount of gas within the subcutaneous soft tissues in the left groin. No well-defined focal fluid collection to suggest drainable abscess. Fibroid uterus.  Large pedunculated anterior fibroid unchanged. Electronically Signed   By: Franky Crease M.D.   On: 11/28/2023 00:01     Labs:   Basic Metabolic  Panel: Recent Labs  Lab 11/27/23 1242 11/29/23 0448 11/30/23 0436  NA 137 135 133*  K 3.8 3.6 3.9  CL 104 100 100  CO2 25 26 22   GLUCOSE 236* 275* 278*  BUN 11 13 16   CREATININE 0.65 0.73 0.74  CALCIUM 9.0 9.0 8.8*   GFR Estimated Creatinine Clearance: 103.1 mL/min (by C-G formula based on SCr of 0.74 mg/dL). Liver Function Tests: Recent Labs  Lab 11/27/23 1242  AST 14*  ALT 30  ALKPHOS 90  BILITOT 0.4  PROT 6.7  ALBUMIN 3.2*   No results for input(s): LIPASE, AMYLASE in the last 168 hours. No results for input(s): AMMONIA in the last 168 hours. Coagulation profile No results for input(s): INR, PROTIME in the last 168 hours.  CBC: Recent Labs  Lab 11/27/23 1242 11/29/23 0448 11/30/23 0436  WBC 11.4* 9.4 8.7  NEUTROABS 6.5  --   --   HGB 11.1* 10.9* 11.5*  HCT 34.4* 34.6* 36.4  MCV 85.1 85.6 85.4  PLT 321 341 362   Cardiac Enzymes: No results for input(s): CKTOTAL, CKMB, CKMBINDEX, TROPONINI in the last 168 hours. BNP: Invalid input(s): POCBNP CBG: Recent Labs  Lab 11/29/23 1142 11/29/23 1520 11/29/23 2025 11/29/23 2358 11/30/23 0731  GLUCAP 122* 89 236* 193* 266*   D-Dimer No results for input(s): DDIMER in the last 72 hours. Hgb A1c Recent Labs    11/28/23 1525  HGBA1C  13.0*   Lipid Profile No results for input(s): CHOL, HDL, LDLCALC, TRIG, CHOLHDL, LDLDIRECT in the last 72 hours. Thyroid function studies No results for input(s): TSH, T4TOTAL, T3FREE, THYROIDAB in the last 72 hours.  Invalid input(s): FREET3 Anemia work up No results for input(s): VITAMINB12, FOLATE, FERRITIN, TIBC, IRON, RETICCTPCT in the last 72 hours. Microbiology Recent Results (from the past 240 hours)  Blood culture (routine x 2)     Status: None (Preliminary result)   Collection Time: 11/27/23 10:08 PM   Specimen: BLOOD RIGHT ARM  Result Value Ref Range Status   Specimen Description   Final    BLOOD RIGHT  ARM Performed at [facility], [address], [place], [place] [address]    Special Requests   Final    BOTTLES DRAWN AEROBIC AND ANAEROBIC Blood Culture results may not be optimal due to an inadequate volume of blood received in culture bottles Performed at [facility], [address], [place], [place] [address]    Culture   Final    NO GROWTH 3 DAYS Performed at [facility], [address], [place], [place] [address]    Report Status PENDING  Incomplete  Blood culture (routine x 2)     Status: None (Preliminary result)   Collection Time: 11/27/23 10:25 PM   Specimen: BLOOD LEFT FOREARM  Result Value Ref Range Status   Specimen Description   Final    BLOOD LEFT FOREARM Performed at [facility], [address] [place], [place] [address]    Special Requests   Final    BOTTLES DRAWN AEROBIC AND ANAEROBIC Blood Culture results may not be optimal due to an inadequate volume of blood received in culture bottles Performed at [facility], [address] [place], [place] [address]    Culture   Final    NO GROWTH 3 DAYS Performed at [facility], [address] [place], [place] [address]    Report Status PENDING  Incomplete  Aerobic/Anaerobic Culture w Gram Stain (surgical/deep wound)     Status: None (Preliminary result)   Collection Time: 11/28/23  2:16 PM   Specimen: PATH Soft tissue  Result Value Ref Range Status   Specimen Description   Final    ABSCESS Performed at [facility], [address] [place], [place] [address]    Special Requests   Final    NONE Performed at [facility], [address] [place], [place] [address]    Gram Stain   Final    WBC PRESENT, PREDOMINANTLY PMN WBC PRESENT, PREDOMINANTLY PMN NO ORGANISMS SEEN    Culture   Final    CULTURE REINCUBATED FOR BETTER GROWTH Performed at [facility], [address], [place],  [place] [address]    Report Status PENDING  Incomplete     Discharge Instructions:   Discharge Instructions     Call MD for:  persistant nausea and vomiting   Complete by: As directed    Call MD for:  redness, tenderness, or signs of infection (pain, swelling, redness, odor or green/yellow discharge around incision site)   Complete by: As directed    Call MD for:  severe uncontrolled pain   Complete by: As directed    Call MD for:  temperature >100.4   Complete by: As directed    Diet Carb Modified   Complete by: As directed    Discharge instructions  Complete by: As directed    Follow-up with your primary care provider in 1 week.  Check blood work at that time.  Continue antibiotics as prescribed.  Follow-up with general surgery as has been scheduled.  Seek medical attention for worsening symptoms.   Increase activity slowly   Complete by: As directed       Allergies as of 11/30/2023   No Known Allergies      Medication List     TAKE these medications    amLODipine  10 MG tablet Commonly known as: NORVASC  Take 10 mg by mouth daily.   amoxicillin -clavulanate 875-125 MG tablet Commonly known as: AUGMENTIN  Take 1 tablet by mouth 2 (two) times daily for 7 days.   atenolol  25 MG tablet Commonly known as: TENORMIN  Take 1 tablet by mouth daily.   baclofen  10 MG tablet Commonly known as: LIORESAL  Take 10 mg by mouth 3 (three) times daily.   Basaglar  KwikPen 100 UNIT/ML Inject 40 Units into the skin in the morning.   Benlysta 200 MG/ML Soaj Generic drug: Belimumab Inject 200 mg into the skin once a week.   doxycycline  100 MG tablet Commonly known as: VIBRA -TABS Take 1 tablet (100 mg total) by mouth 2 (two) times daily for 7 days.   Fiasp  100 UNIT/ML Soln Generic drug: Insulin  Aspart (w/Niacinamide) Inject 6 Units into the skin 3 (three) times daily as needed (high blood sugar).   fluticasone  50 MCG/ACT nasal spray Commonly known as: FLONASE  Place 1 spray into both  nostrils daily as needed for allergies.   HYDROcodone -acetaminophen  5-325 MG tablet Commonly known as: NORCO/VICODIN Take 1 tablet by mouth every 6 (six) hours as needed for up to 3 days for moderate pain (pain score 4-6) or severe pain (pain score 7-10).   hydrOXYzine  25 MG tablet Commonly known as: ATARAX  TAKE ONE TO TWO, AT BEDTIME AS NEEDED FOR ITCHING. What changed:  how much to take how to take this when to take this additional instructions   meclizine  25 MG tablet Commonly known as: ANTIVERT  Take 1 tablet (25 mg total) by mouth 3 (three) times daily as needed for dizziness.   pantoprazole  40 MG tablet Commonly known as: PROTONIX  Take 1 tablet (40 mg total) by mouth daily.   predniSONE  20 MG tablet Commonly known as: DELTASONE  Take 2 tablets (40 mg total) by mouth daily with breakfast. What changed:  how much to take when to take this   tacrolimus  1 MG capsule Commonly known as: PROGRAF  Take 2-3 mg by mouth 2 (two) times daily. Take 3 capsules (3 mg) in the morning and Take 2 capsules (2 mg) at bedtime   ursodiol  300 MG capsule Commonly known as: ACTIGALL  Take 2 capsules (600 mg total) by mouth 2 (two) times daily.   valACYclovir 500 MG tablet Commonly known as: VALTREX Take 500 mg by mouth as needed (fever blisters).   Vitamin D (Ergocalciferol) 1.25 MG (50000 UNIT) Caps capsule Commonly known as: DRISDOL Take 50,000 Units by mouth every 7 (seven) days.        Follow-up Information     Maczis, Puja Gosai, PA-C. Go on 12/12/2023.   Specialty: General Surgery Why: 1/21 at 10:30am., Please arrive 30 minutes early to complete check in, and bring photo ID and insurance card. Contact information: [address] [place] [address] [facility] [place] [address] [phone]         Jhon Elveria LABOR, MD Follow up in 1 week(s).   Specialty: Family Medicine Contact  information: [address] [place]  [address] [phone]                  Time coordinating discharge: 39 minutes  Signed:  Deagan Sevin  [facility] 11/30/2023, 9:03 AM

## 2023-11-30 NOTE — Progress Notes (Signed)
 AVS reviewed w/ pt who verbalized an understanding. PIV removed as noted. See pain meds - pt verbalized an understanding that she should take next dose in 4 to 6 hours as needed for pain. Pt's husband enroute w/ patient's clothes. No other questions at this time. Wound care reviewed per AVS

## 2023-11-30 NOTE — Progress Notes (Signed)
 2 Days Post-Op   Subjective/Chief Complaint: Pt reports minimal pain this morning   Objective: Vital signs in last 24 hours: Temp:  [98.2 F (36.8 C)-98.3 F (36.8 C)] 98.3 F (36.8 C) (01/09 0609) Pulse Rate:  [75-100] 100 (01/09 0609) Resp:  [16-18] 16 (01/09 0609) BP: (129-155)/(83-103) 130/83 (01/09 0609) SpO2:  [98 %-100 %] 98 % (01/09 0609) Last BM Date : 11/28/23  Intake/Output from previous day: 01/08 0701 - 01/09 0700 In: 850.4 [P.O.:640; IV Piggyback:210.4] Out: 600 [Urine:600] Intake/Output this shift: No intake/output data recorded.  Exam: Awake and alert Comfortable I and D site much softer with no erthema and much less induration  Lab Results:  Recent Labs    11/29/23 0448 11/30/23 0436  WBC 9.4 8.7  HGB 10.9* 11.5*  HCT 34.6* 36.4  PLT 341 362   BMET Recent Labs    11/29/23 0448 11/30/23 0436  NA 135 133*  K 3.6 3.9  CL 100 100  CO2 26 22  GLUCOSE 275* 278*  BUN 13 16  CREATININE 0.73 0.74  CALCIUM 9.0 8.8*   PT/INR No results for input(s): LABPROT, INR in the last 72 hours. ABG No results for input(s): PHART, HCO3 in the last 72 hours.  Invalid input(s): PCO2, PO2  Studies/Results: No results found.  Anti-infectives: Anti-infectives (From admission, onward)    Start     Dose/Rate Route Frequency Ordered Stop   11/30/23 0000  doxycycline  (VIBRA -TABS) 100 MG tablet        100 mg Oral 2 times daily 11/30/23 0845 12/07/23 2359   11/30/23 0000  amoxicillin -clavulanate (AUGMENTIN ) 875-125 MG tablet        1 tablet Oral 2 times daily 11/30/23 0845 12/07/23 2359   11/28/23 1000  piperacillin -tazobactam (ZOSYN ) IVPB 3.375 g  Status:  Discontinued        3.375 g 12.5 mL/hr over 240 Minutes Intravenous Every 8 hours 11/28/23 0807 11/28/23 0832   11/28/23 1000  metroNIDAZOLE  (FLAGYL ) IVPB 500 mg        500 mg 100 mL/hr over 60 Minutes Intravenous Every 12 hours 11/28/23 0832     11/28/23 1000  ceFEPIme  (MAXIPIME ) 2 g in  sodium chloride  0.9 % 100 mL IVPB        2 g 200 mL/hr over 30 Minutes Intravenous Every 8 hours 11/28/23 0854     11/28/23 1000  vancomycin  (VANCOCIN ) IVPB 1000 mg/200 mL premix        1,000 mg 200 mL/hr over 60 Minutes Intravenous Every 12 hours 11/28/23 0854     11/27/23 2200  vancomycin  (VANCOREADY) IVPB 2000 mg/400 mL        2,000 mg 200 mL/hr over 120 Minutes Intravenous  Once 11/27/23 2154 11/28/23 0231   11/27/23 2145  ceFEPIme  (MAXIPIME ) 2 g in sodium chloride  0.9 % 100 mL IVPB        2 g 200 mL/hr over 30 Minutes Intravenous  Once 11/27/23 2142 11/28/23 0230   11/27/23 2145  vancomycin  (VANCOCIN ) IVPB 1000 mg/200 mL premix  Status:  Discontinued        1,000 mg 200 mL/hr over 60 Minutes Intravenous  Once 11/27/23 2142 11/27/23 2153   11/27/23 2145  metroNIDAZOLE  (FLAGYL ) IVPB 500 mg        500 mg 100 mL/hr over 60 Minutes Intravenous  Once 11/27/23 2142 11/28/23 0514       Assessment/Plan: Mons/labial abscess POD #2 s/p IRRIGATION AND DEBRIDEMENT MONS AND LABIAL ABSCESS   -continue wound care -ok  for discharge on oral antibiotics from a surgical standpoint   LOS: 2 days    Vicenta Poli 11/30/2023

## 2023-12-02 LAB — CULTURE, BLOOD (ROUTINE X 2)
Culture: NO GROWTH
Culture: NO GROWTH

## 2023-12-03 LAB — AEROBIC/ANAEROBIC CULTURE W GRAM STAIN (SURGICAL/DEEP WOUND)

## 2023-12-11 DIAGNOSIS — D219 Benign neoplasm of connective and other soft tissue, unspecified: Secondary | ICD-10-CM

## 2024-01-09 DIAGNOSIS — D219 Benign neoplasm of connective and other soft tissue, unspecified: Secondary | ICD-10-CM

## 2024-01-16 ENCOUNTER — Ambulatory Visit (INDEPENDENT_AMBULATORY_CARE_PROVIDER_SITE_OTHER): Payer: 59 | Admitting: Gastroenterology

## 2024-01-16 ENCOUNTER — Encounter: Payer: Self-pay | Admitting: Gastroenterology

## 2024-01-16 VITALS — BP 124/80 | HR 100 | Ht 66.0 in | Wt 192.0 lb

## 2024-01-16 DIAGNOSIS — K219 Gastro-esophageal reflux disease without esophagitis: Secondary | ICD-10-CM | POA: Diagnosis not present

## 2024-01-16 DIAGNOSIS — R1111 Vomiting without nausea: Secondary | ICD-10-CM | POA: Insufficient documentation

## 2024-01-16 DIAGNOSIS — R1310 Dysphagia, unspecified: Secondary | ICD-10-CM | POA: Diagnosis not present

## 2024-01-16 DIAGNOSIS — R197 Diarrhea, unspecified: Secondary | ICD-10-CM | POA: Diagnosis not present

## 2024-01-16 DIAGNOSIS — R1319 Other dysphagia: Secondary | ICD-10-CM | POA: Insufficient documentation

## 2024-01-16 NOTE — Progress Notes (Addendum)
 01/16/2024 ENEDELIA Rojas [id number] 03-31-1981   HISTORY OF PRESENT ILLNESS: This is a 43 year old female who is a patient Dr. [name].  She has autoimmune hepatitis and follows with [name], NP, through [facility].  She also has mixed connective tissue disease and lupus.  Anyway, she is here today with complaints of reflux and vomiting versus regurgitation.  Says that every time that she eats it will either feel like it is not going down and then come back up or within 5 to 10 minutes of eating it will come back up.  She says that she feels like it is probably regurgitation more than vomiting, although she does dry heave at times.  This just started about 3 weeks ago.  She tells me her pantoprazole was stopped about a month ago by the hepatology team.  All along though she has been on famotidine 20 mg twice daily as well so she had continued that during the time that she was off of the pantoprazole.  Now about a week ago her PCP prescribed omeprazole 40 mg daily.  Apparently they did make hepatology aware that they were putting her back on it.  She still continuing to have issues, however.  Says that even grits and soup/liquids want to come back up.  EGD 08/2023:  - Benign- appearing esophageal stenosis. Dilated. Very mild stricture. - Erythematous mucosa in the gastric body and antrum. Biopsied. - The examination was otherwise normal.  FINAL DIAGNOSIS        1. Surgical [P], gastric antrum body bx's :       -  ANTRAL AND OXYNTIC MUCOSA WITH MILD CHEMICAL/REACTIVE CHANGE.       -  NO HELICOBACTER PYLORI ORGANISMS IDENTIFIED ON H&E STAINED SLIDE.   Also tells me that she has been having diarrhea for about the past 2 weeks.  Has not really had any formed stools.  Getting up in the middle of the night sometimes with bowel movements.  Tells me that she had been on Bactrim continuously since about September until just recently.  Says that they had her on it because of being on the  tacrolimus.   Past Medical History:  Diagnosis Date   AMA + Autoimmune hepatitis (HCC) 08/10/2023   Anxiety    PP treated with counseling no meds   Fibromyalgia    GERD (gastroesophageal reflux disease)    History of palpitations    negative work-up by cardiologist-- dr Eden Emms-- 2015   Hypertension    taking labetalol   Lupus (systemic lupus erythematosus) (HCC)    MCTD (mixed connective tissue disease) (HCC)    dx 07/ 2015--  rheumotologist--  dr Zenovia Jordan   Nausea    Numbness and tingling    Wears contact lenses    Past Surgical History:  Procedure Laterality Date   BIOPSY STOMACH     DILATION AND EVACUATION N/A 06/17/2016   Procedure: DILATATION AND EVACUATION;  Surgeon: [name], DO;  Location: [facility];  Service: Gynecology;  Laterality: N/A;  WTIH SUCTION   IRRIGATION AND DEBRIDEMENT ABSCESS N/A 11/28/2023   Procedure: IRRIGATION AND DEBRIDEMENT ABSCESS;  Surgeon: [name], MD;  Location: [facility];  Service: General;  Laterality: N/A;   LIVER BIOPSY     WISDOM TOOTH EXTRACTION  2012    reports that she has never smoked. She has never used smokeless tobacco. She reports current alcohol use of about 1.0 standard drink of alcohol per week. She reports that  she does not use drugs. family history includes Healthy in her brother, brother, daughter, and daughter; Hypertension in her father, maternal grandmother, and mother. No Known Allergies    Outpatient Encounter Medications as of 01/16/2024  Medication Sig   amLODipine (NORVASC) 10 MG tablet Take 10 mg by mouth daily.   atenolol (TENORMIN) 25 MG tablet Take 1 tablet by mouth daily.   baclofen (LIORESAL) 10 MG tablet Take 10 mg by mouth 3 (three) times daily.   BENLYSTA 200 MG/ML SOAJ Inject 200 mg into the skin once a week.   FIASP 100 UNIT/ML SOLN Inject 6 Units into the skin 3 (three) times daily as needed (high blood sugar).   fluticasone (FLONASE) 50 MCG/ACT nasal spray Place 1 spray  into both nostrils daily as needed for allergies.   hydrOXYzine (ATARAX) 25 MG tablet TAKE ONE TO TWO, AT BEDTIME AS NEEDED FOR ITCHING. (Patient taking differently: Take 25 mg by mouth at bedtime.)   meclizine (ANTIVERT) 25 MG tablet Take 1 tablet (25 mg total) by mouth 3 (three) times daily as needed for dizziness.   tacrolimus (PROGRAF) 1 MG capsule Take 2-3 mg by mouth 2 (two) times daily. Take 3 capsules (3 mg) in the morning and Take 2 capsules (2 mg) at bedtime   ursodiol (ACTIGALL) 300 MG capsule Take 2 capsules (600 mg total) by mouth 2 (two) times daily.   valACYclovir (VALTREX) 500 MG tablet Take 500 mg by mouth as needed (fever blisters).   Vitamin D, Ergocalciferol, (DRISDOL) 1.25 MG (50000 UNIT) CAPS capsule Take 50,000 Units by mouth every 7 (seven) days.   Insulin Glargine (BASAGLAR KWIKPEN) 100 UNIT/ML Inject 40 Units into the skin in the morning. (Patient not taking: Reported on 01/16/2024)   pantoprazole (PROTONIX) 40 MG tablet Take 1 tablet (40 mg total) by mouth daily. (Patient not taking: Reported on 01/16/2024)   predniSONE (DELTASONE) 20 MG tablet Take 2 tablets (40 mg total) by mouth daily with breakfast. (Patient not taking: Reported on 01/16/2024)   No facility-administered encounter medications on file as of 01/16/2024.    REVIEW OF SYSTEMS  : All other systems reviewed and negative except where noted in the History of Present Illness.   PHYSICAL EXAM: BP 124/80   Pulse 100   Ht 5\' 6"  (1.676 m)   Wt 192 lb (87.1 kg)   BMI 30.99 kg/m  General: Well developed female in no acute distress Head: Normocephalic and atraumatic Eyes:  Sclerae anicteric, conjunctiva pink. Ears: Normal auditory acuity Lungs: Clear throughout to auscultation; no W/R/R. Heart: Regular rate and rhythm; no M/R/G. Abdomen: Soft, non-distended.  BS present.  Non-tender. Musculoskeletal: Symmetrical with no gross deformities  Skin: No lesions on visible extremities Extremities: No edema   Neurological: Alert oriented x 4, grossly nonfocal Psychological:  Alert and cooperative. Normal mood and affect  ASSESSMENT AND PLAN: *Dysphagia with questionable vomiting versus regurgitation.  This occurs immediately with eating or within 5 to 10 minutes of eating.  Had a very mild stricture that was dilated in October.  She had come off of her pantoprazole for a few weeks, but continued on her famotidine during that time.  She is now on omeprazole instead that was prescribed by her PCP.  She will continue that and the famotidine for now.  I wonder if this is more of a motility issue as she does have other autoimmune and rheumatologic conditions.  Will check an esophagram/upper GI study. *Diarrhea:  Having diarrhea for about the past 2  weeks.  Had been on Bactrim continuously since September until just recently.  Will check a Diatherix for Cdiff.   CC:  Murrell Converse, MD   Primary GI MD  Omeprazole and other PPI's can interact with metabolism of tacrolimus She will need to stop the omeprazole  Would try 40 mg famotidine bid  Will communicate w/ Ms. Taquila Leys and we will inform patient  Iva Boop, MD, [facility]

## 2024-01-16 NOTE — Patient Instructions (Signed)
 You have been scheduled for an Upper GI Series at [facility]. Your appointment is on Thursday 01/25/24 at 9 am. Please arrive 30 minutes prior to your test for registration. Make sure not to eat or drink anything after midnight on the night before your test. If you need to reschedule, please call radiology at [phone]. ________________________________________________________________ An upper GI series uses x rays to help diagnose problems of the upper GI tract, which includes the esophagus, stomach, and duodenum. The duodenum is the first part of the small intestine. An upper GI series is conducted by a radiology technologist or a radiologist--a doctor who specializes in x-ray imaging--at a hospital or outpatient center. While sitting or standing in front of an x-ray machine, the patient drinks barium liquid, which is often white and has a chalky consistency and taste. The barium liquid coats the lining of the upper GI tract and makes signs of disease show up more clearly on x rays. X-ray video, called fluoroscopy, is used to view the barium liquid moving through the esophagus, stomach, and duodenum. Additional x rays and fluoroscopy are performed while the patient lies on an x-ray table. To fully coat the upper GI tract with barium liquid, the technologist or radiologist may press on the abdomen or ask the patient to change position. Patients hold still in various positions, allowing the technologist or radiologist to take x rays of the upper GI tract at different angles. If a technologist conducts the upper GI series, a radiologist will later examine the images to look for problems.  This test typically takes about 1 hour to complete. __________________________________________________________________  Your provider has ordered "Diatherix" stool testing for you. You have received a kit from our office today containing all necessary supplies to complete this test. Please carefully read the  stool collection instructions provided in the kit before opening the accompanying materials. In addition, be sure there is a label providing your full name and date of birth on the "puritan opti-swab" tube that is supplied in the kit (if you do not see a label with this information on your test tube, please make [place] aware before test collection!). After completing the test, you should secure the purtian tube into the specimen biohazard bag. The [facility] E-Req sheet (including date and time of specimen collection) should be placed into the outside pocket of the specimen biohazard bag and returned to the [place] lab (basement floor of [facility]) within 3 days of collection. Please make sure to give the specimen to a staff member at the lab. DO NOT leave the specimen on the counter.   If the specimen date and time (can be found in the upper right boxed portion of the sheet) are not filled out on the E-Req sheet, the test will NOT be performed.

## 2024-01-19 ENCOUNTER — Telehealth: Payer: Self-pay | Admitting: Gastroenterology

## 2024-01-19 NOTE — Telephone Encounter (Signed)
 Returned patient call & she has been unable to keep any food or liquid down. States she vomits after everything she eats/drinks. The last thing she had was an ensure & apple juice yesterday which came back up. Takes omeprazole & pepcid when able to keep it down. She has some LUQ soreness. She turned in diatherix sample yesterday. She says this has been going on since January. Advised her I'd reach out to [place], [place] & get back with her. OV 01/16/24.

## 2024-01-19 NOTE — Telephone Encounter (Signed)
 Patient called and stated that she would like to know if her lab results have came back already. Patient also stated that she has not been able to keep anything down, and her hungry is bringing her to tears, she mentioned that she had a glass of water and she was not able to keep that down and throw up with stomach acid. Patient stated that she is afraid that she is becoming dehydrated. Patient is requesting a call. Please advise.

## 2024-01-19 NOTE — Telephone Encounter (Signed)
 Pt advised of PA recommendations & verbalized all understanding.

## 2024-01-21 ENCOUNTER — Other Ambulatory Visit: Payer: Self-pay

## 2024-01-21 ENCOUNTER — Encounter (HOSPITAL_COMMUNITY): Payer: Self-pay

## 2024-01-21 ENCOUNTER — Emergency Department (HOSPITAL_COMMUNITY)
Admission: EM | Admit: 2024-01-21 | Discharge: 2024-01-21 | Disposition: A | Discharge status: Home / Self Care | Attending: Emergency Medicine | Admitting: Emergency Medicine

## 2024-01-21 DIAGNOSIS — Z79899 Other long term (current) drug therapy: Secondary | ICD-10-CM | POA: Insufficient documentation

## 2024-01-21 DIAGNOSIS — Z794 Long term (current) use of insulin: Secondary | ICD-10-CM | POA: Insufficient documentation

## 2024-01-21 DIAGNOSIS — R112 Nausea with vomiting, unspecified: Secondary | ICD-10-CM | POA: Insufficient documentation

## 2024-01-21 DIAGNOSIS — R1110 Vomiting, unspecified: Secondary | ICD-10-CM

## 2024-01-21 LAB — HCG, SERUM, QUALITATIVE: Preg, Serum: NEGATIVE

## 2024-01-21 LAB — COMPREHENSIVE METABOLIC PANEL
ALT: 10 U/L (ref 0–44)
AST: 22 U/L (ref 15–41)
Albumin: 4.1 g/dL (ref 3.5–5.0)
Alkaline Phosphatase: 60 U/L (ref 38–126)
Anion gap: 10 (ref 5–15)
BUN: 23 mg/dL — ABNORMAL HIGH (ref 6–20)
CO2: 25 mmol/L (ref 22–32)
Calcium: 9.6 mg/dL (ref 8.9–10.3)
Chloride: 110 mmol/L (ref 98–111)
Creatinine, Ser: 1.23 mg/dL — ABNORMAL HIGH (ref 0.44–1.00)
GFR, Estimated: 56 mL/min — ABNORMAL LOW (ref >60)
Glucose, Bld: 96 mg/dL (ref 70–99)
Potassium: 3.5 mmol/L (ref 3.5–5.1)
Sodium: 145 mmol/L (ref 135–145)
Total Bilirubin: 1.1 mg/dL (ref 0.0–1.2)
Total Protein: 7.6 g/dL (ref 6.5–8.1)

## 2024-01-21 LAB — CBC
HCT: 38.2 % (ref 36.0–46.0)
Hemoglobin: 11.8 g/dL — ABNORMAL LOW (ref 12.0–15.0)
MCH: 25.9 pg — ABNORMAL LOW (ref 26.0–34.0)
MCHC: 30.9 g/dL (ref 30.0–36.0)
MCV: 84 fL (ref 80.0–100.0)
Platelets: 346 10*3/uL (ref 150–400)
RBC: 4.55 MIL/uL (ref 3.87–5.11)
RDW: 14.4 % (ref 11.5–15.5)
WBC: 5.4 10*3/uL (ref 4.0–10.5)
nRBC: 0 % (ref 0.0–0.2)

## 2024-01-21 LAB — LIPASE, BLOOD: Lipase: 23 U/L (ref 11–51)

## 2024-01-21 MED ORDER — ALUM & MAG HYDROXIDE-SIMETH 200-200-20 MG/5ML PO SUSP: 15.0000 mL | Freq: Four times a day (QID) | ORAL | 0 refills | Status: AC | PRN

## 2024-01-21 MED ORDER — ALUM & MAG HYDROXIDE-SIMETH 200-200-20 MG/5ML PO SUSP
30.0000 mL | Freq: Once | ORAL | Status: AC
  Administered 2024-01-21: 30 mL via ORAL
  Filled 2024-01-21: qty 30

## 2024-01-21 MED ORDER — SODIUM CHLORIDE 0.9 % IV BOLUS
1000.0000 mL | Freq: Once | INTRAVENOUS | Status: AC
  Administered 2024-01-21: 1000 mL via INTRAVENOUS

## 2024-01-21 NOTE — ED Provider Notes (Signed)
 [place] EMERGENCY DEPARTMENT AT [facility] Provider Note   CSN: [id number] Arrival date & time: 01/21/24  1415     History  Chief Complaint  Patient presents with   Emesis    Michelle Rojas is a 43 y.o. female.  The history is provided by the patient and medical records. No language interpreter was used.  Emesis    43 year old female history of lupus, fibromyalgia, autoimmune hepatitis, GERD presenting with complaints of  reflux.  For the past month patient has had either reflux versus regurgitation.  States every time she eats or drinks she would regurgitate in 5 or 10 minutes.  She had an EGD in 08/2023 that shows benign-appearing esophageal stenosis with very mild stricture which was dilated.  She is currently on PPI, and also taking famotidine.  States that the medications she is taking is not helping much.  She reports frustration.  She states that she was evaluated at her GI office recently, but was not given any medication to help with her symptoms.  She is scheduled to have follow-up study next week but her symptoms is getting progressively worse.  She does not endorse any fever or chills no chest pain shortness of breath no productive cough no significant abdominal pain.  She does endorse throat irritation from regurgitation and heartburn sensation.  No urinary symptoms.  Home Medications Prior to Admission medications   Medication Sig Start Date End Date Taking? Authorizing Provider  amLODipine (NORVASC) 10 MG tablet Take 10 mg by mouth daily. 08/08/23   [provider]  atenolol (TENORMIN) 25 MG tablet Take 1 tablet by mouth daily. 10/17/23 10/16/24  [provider]  baclofen (LIORESAL) 10 MG tablet Take 10 mg by mouth 3 (three) times daily. 10/17/23   [provider]  BENLYSTA 200 MG/ML SOAJ Inject 200 mg into the skin once a week. 02/03/19   [provider]  famotidine (PEPCID) 20 MG tablet Take 20 mg by mouth 2 (two)  times daily. 12/03/23   [provider]  FIASP 100 UNIT/ML SOLN Inject 6 Units into the skin 3 (three) times daily as needed (high blood sugar). 10/23/23   [provider]  fluticasone (FLONASE) 50 MCG/ACT nasal spray Place 1 spray into both nostrils daily as needed for allergies. 05/30/23   [provider]  hydrOXYzine (ATARAX) 25 MG tablet TAKE ONE TO TWO, AT BEDTIME AS NEEDED FOR ITCHING. Patient taking differently: Take 25 mg by mouth at bedtime. 09/20/23   Iva Boop, MD  Insulin Glargine [facility]) 100 UNIT/ML Inject 40 Units into the skin in the morning. Patient not taking: Reported on 01/16/2024 10/26/23 01/26/24  [provider]  meclizine (ANTIVERT) 25 MG tablet Take 1 tablet (25 mg total) by mouth 3 (three) times daily as needed for dizziness. 12/13/21   Tanda Rockers, PA-C  omeprazole (PRILOSEC) 40 MG capsule Take 40 mg by mouth daily. 01/10/24 01/09/25  [provider]  predniSONE (DELTASONE) 20 MG tablet Take 2 tablets (40 mg total) by mouth daily with breakfast. Patient not taking: Reported on 01/16/2024 08/17/23   Arnetha Courser, MD  tacrolimus (PROGRAF) 1 MG capsule Take 2-3 mg by mouth 2 (two) times daily. Take 3 capsules (3 mg) in the morning and Take 2 capsules (2 mg) at bedtime    [provider]  ursodiol (ACTIGALL) 300 MG capsule Take 2 capsules (600 mg total) by mouth 2 (two) times daily. 08/29/23   Iva Boop, MD  valACYclovir (  VALTREX) 500 MG tablet Take 500 mg by mouth as needed (fever blisters).    [provider]  Vitamin D, Ergocalciferol, (DRISDOL) 1.25 MG (50000 UNIT) CAPS capsule Take 50,000 Units by mouth every 7 (seven) days. 03/01/21   [provider]      Allergies    Patient has no known allergies.    Review of Systems   Review of Systems  Gastrointestinal:  Positive for vomiting.  All other systems reviewed and are negative.   Physical Exam Updated Vital Signs BP (!) 137/105  (BP Location: Right Arm)   Pulse 96   Temp 99 F (37.2 C) (Oral)   Resp 16   Ht 5\' 6"  (1.676 m)   Wt 83.9 kg   SpO2 100%   BMI 29.86 kg/m  Physical Exam Vitals and nursing note reviewed.  Constitutional:      General: She is not in acute distress.    Appearance: She is well-developed.     Comments: Patient is laying in bed, spitting saliva into a bottle but does not appear to be in any acute respiratory distress.  HENT:     Head: Atraumatic.     Mouth/Throat:     Comments: Throat exam unremarkable Eyes:     Conjunctiva/sclera: Conjunctivae normal.  Cardiovascular:     Rate and Rhythm: Normal rate and regular rhythm.     Pulses: Normal pulses.     Heart sounds: Normal heart sounds.  Pulmonary:     Effort: Pulmonary effort is normal.     Breath sounds: No wheezing or rales.  Abdominal:     General: Bowel sounds are normal.     Palpations: Abdomen is soft.     Tenderness: There is no abdominal tenderness.  Musculoskeletal:        General: Normal range of motion.     Cervical back: Neck supple.  Skin:    Findings: No rash.  Neurological:     Mental Status: She is alert. Mental status is at baseline.  Psychiatric:        Mood and Affect: Mood normal.     ED Results / Procedures / Treatments   Labs (all labs ordered are listed, but only abnormal results are displayed) Labs Reviewed  COMPREHENSIVE METABOLIC PANEL - Abnormal; Notable for the following components:      Result Value   BUN 23 (*)    Creatinine, Ser 1.23 (*)    GFR, Estimated 56 (*)    All other components within normal limits  CBC - Abnormal; Notable for the following components:   Hemoglobin 11.8 (*)    MCH 25.9 (*)    All other components within normal limits  LIPASE, BLOOD  HCG, SERUM, QUALITATIVE  URINALYSIS, ROUTINE W REFLEX MICROSCOPIC    EKG None  Radiology No results found.  Procedures Procedures    Medications Ordered in ED Medications  alum & mag hydroxide-simeth  (MAALOX/MYLANTA) 200-200-20 MG/5ML suspension 30 mL (30 mLs Oral Given 01/21/24 1637)  sodium chloride 0.9 % bolus 1,000 mL (1,000 mLs Intravenous New Bag/Given 01/21/24 1638)    ED Course/ Medical Decision Making/ A&P                                 Medical Decision Making Amount and/or Complexity of Data Reviewed Labs: ordered.  Risk OTC drugs.   BP (!) 137/105 (BP Location: Right Arm)   Pulse 96   Temp  99 F (37.2 C) (Oral)   Resp 16   Ht 5\' 6"  (1.676 m)   Wt 83.9 kg   SpO2 100%   BMI 29.86 kg/m   4:67 PM 43 year old female history of lupus, fibromyalgia, autoimmune hepatitis, GERD presenting with complaints of  reflux.  For the past month patient has had either reflux versus regurgitation.  States every time she eats or drinks she would regurgitate in 5 or 10 minutes.  She had an EGD in 08/2023 that shows benign-appearing esophageal stenosis with very mild stricture which was dilated.  She is currently on PPI, and also taking famotidine.  States that the medications she is taking is not helping much.  She reports frustration.  She states that she was evaluated at her GI office recently, but was not given any medication to help with her symptoms.  She is scheduled to have follow-up study next week but her symptoms is getting progressively worse.  She does not endorse any fever or chills no chest pain shortness of breath no productive cough no significant abdominal pain.  She does endorse throat irritation from regurgitation and heartburn sensation.  No urinary symptoms.  On exam, patient is laying in bed, she is spitting saliva into a bottle but she is not in any acute respiratory discomfort.  Throat exam unremarkable, lungs are clear to auscultation bilaterally abdomen is soft nontender with bowel sounds present.  She is resting comfortably.  -Labs ordered, independently viewed and interpreted by me.  Labs remarkable for cr 1.23, IVF given  -The patient was maintained on a cardiac  monitor.  I personally viewed and interpreted the cardiac monitored which showed an underlying rhythm of: NSR -Imaging including EGD considered.  Pt will f/u with GI specialist in 4 days -This patient presents to the ED for concern of emesis, this involves an extensive number of treatment options, and is a complaint that carries with it a high risk of complications and morbidity.  The differential diagnosis includes gastritis, gerd, esophageal stricture -Co morbidities that complicate the patient evaluation includes Lupus, Hepatitis, fibromyalgia, esophageal stricture -Treatment includes GI cocktail -Reevaluation of the patient after these medicines showed that the patient improved -PCP office notes or outside notes reviewed -Escalation to admission/observation considered: patients feels much better, is comfortable with discharge, and will follow up with PCP -Prescription medication considered, patient comfortable with gi cocktail -Social Determinant of Health considered which includes financial difficulty, stress         Final Clinical Impression(s) / ED Diagnoses Final diagnoses:  Vomiting, unspecified vomiting type, unspecified whether nausea present    Rx / DC Orders ED Discharge Orders          Ordered    alum & mag hydroxide-simeth (MAALOX/MYLANTA) 200-200-20 MG/5ML suspension  Every 6 hours PRN        01/21/24 1654              Fayrene Helper, PA-C 01/21/24 1721    Charlynne Pander, MD 01/21/24 [phone]

## 2024-01-21 NOTE — Discharge Instructions (Addendum)
 Please take medication prescribed as needed for your discomfort.  You do have an appointment with your GI specialist on March 6 at 9 AM.  Follow-up closely with them for further care.

## 2024-01-21 NOTE — ED Triage Notes (Signed)
 Vomiting for a month. Denies abdominal pain. Pt states she has seen PCP for this and was placed on omeprazole. Pt has barium swallow scheduled for this Thursday. Pt has an appetite, but is unable to hold anything down. Intermittent diarrhea

## 2024-01-22 ENCOUNTER — Ambulatory Visit (HOSPITAL_COMMUNITY): Admit: 2024-01-22 | Payer: 59 | Admitting: Obstetrics & Gynecology

## 2024-01-22 ENCOUNTER — Encounter (HOSPITAL_COMMUNITY): Payer: Self-pay | Admitting: Anesthesiology

## 2024-01-22 DIAGNOSIS — D219 Benign neoplasm of connective and other soft tissue, unspecified: Secondary | ICD-10-CM

## 2024-01-22 SURGERY — MYOMECTOMY, ABDOMINAL APPROACH
Anesthesia: General

## 2024-01-24 ENCOUNTER — Telehealth: Payer: Self-pay | Admitting: *Deleted

## 2024-01-24 MED ORDER — FAMOTIDINE 40 MG PO TABS: 40.0000 mg | ORAL_TABLET | Freq: Two times a day (BID) | ORAL | 5 refills | Status: AC

## 2024-01-24 NOTE — Telephone Encounter (Signed)
-----   Message from Howard D. Zehr sent at 01/24/2024 12:40 PM EST ----- Please let her know that her C. difficile stool test was negative.  Please let her know that Dr. Leone Payor agreed with hepatology that any PPI can interact with the metabolism of tacrolimus so she needs to stop the omeprazole that was prescribed by her PCP.  We can try famotidine 40 mg twice daily instead.  Please send prescription to the pharmacy for 30-day supply with some refills.  Thank you,  Jess

## 2024-01-24 NOTE — Telephone Encounter (Signed)
 Patient informed of results. Patient states she is now unable to even keep any food or liquids down. She is scheduled for an upper GI tomorrow. Patient informed if she is unable to keep anything down then she should go back to the emergency room

## 2024-01-24 NOTE — Telephone Encounter (Signed)
 Noted.

## 2024-01-24 NOTE — Telephone Encounter (Signed)
 Pt stated that she is requesting the results from her recent stool sample. Please review and advise.

## 2024-01-24 NOTE — Telephone Encounter (Signed)
 Patient called to follow up on results stated she went to the ER and received some fluids to help her. Please advise.

## 2024-01-25 ENCOUNTER — Ambulatory Visit (HOSPITAL_COMMUNITY)
Admission: RE | Admit: 2024-01-25 | Discharge: 2024-01-25 | Disposition: A | Discharge status: Home / Self Care | Payer: 59 | Source: Ambulatory Visit | Attending: Gastroenterology | Admitting: Gastroenterology

## 2024-01-25 DIAGNOSIS — R1319 Other dysphagia: Secondary | ICD-10-CM | POA: Diagnosis present

## 2024-01-25 DIAGNOSIS — K219 Gastro-esophageal reflux disease without esophagitis: Secondary | ICD-10-CM | POA: Diagnosis present

## 2024-01-25 DIAGNOSIS — R1111 Vomiting without nausea: Secondary | ICD-10-CM | POA: Insufficient documentation

## 2024-01-26 ENCOUNTER — Other Ambulatory Visit: Payer: Self-pay

## 2024-01-26 DIAGNOSIS — R112 Nausea with vomiting, unspecified: Secondary | ICD-10-CM

## 2024-01-26 DIAGNOSIS — R1319 Other dysphagia: Secondary | ICD-10-CM

## 2024-01-26 MED ORDER — ONDANSETRON 4 MG PO TBDP: 4.0000 mg | ORAL_TABLET | Freq: Four times a day (QID) | ORAL | 1 refills | Status: AC

## 2024-01-31 ENCOUNTER — Telehealth: Payer: Self-pay | Admitting: Gastroenterology

## 2024-01-31 ENCOUNTER — Other Ambulatory Visit: Payer: Self-pay

## 2024-01-31 DIAGNOSIS — R112 Nausea with vomiting, unspecified: Secondary | ICD-10-CM

## 2024-01-31 DIAGNOSIS — K219 Gastro-esophageal reflux disease without esophagitis: Secondary | ICD-10-CM

## 2024-01-31 DIAGNOSIS — R1319 Other dysphagia: Secondary | ICD-10-CM

## 2024-01-31 NOTE — Telephone Encounter (Signed)
 Dr. Lavon Paganini has spots tomorrow and Friday and has agreed to do the EGD  Please arrange

## 2024-01-31 NOTE — Telephone Encounter (Signed)
 Michelle Rojas - I think arrival time should be 1230 for 130 case

## 2024-01-31 NOTE — Telephone Encounter (Signed)
 Ok, thanks.

## 2024-01-31 NOTE — Telephone Encounter (Signed)
 Called the patient and she is arriving at 12:30 pm.  Thanks

## 2024-01-31 NOTE — Telephone Encounter (Signed)
 Spoke with the patient. She is agreeable to this plan. She accept the Friday appointment and will arrive at 11:30 to the [facility] for an EGDwith Dr Lavon Paganini.

## 2024-01-31 NOTE — Telephone Encounter (Signed)
 Inbound call from patient requesting a call to discuss sooner availability for EGD. States she is still vomiting everyday and does not think she will be able to wait another month. Requesting a call to discuss further. Please advise, thank you.

## 2024-01-31 NOTE — Telephone Encounter (Signed)
 Spoke with the patient. She is taking Zofran every 6 hours. She will still vomit after eating. She is only eating soft foods like mashed potatoes. "They don't hurt when it comes back up." You do not have earlier [facility] openings. Any recommendations?

## 2024-02-02 ENCOUNTER — Encounter: Payer: Self-pay | Admitting: Gastroenterology

## 2024-02-02 ENCOUNTER — Ambulatory Visit: Admitting: Gastroenterology

## 2024-02-02 VITALS — BP 136/86 | HR 72 | Temp 98.3°F | Resp 12 | Ht 66.0 in | Wt 192.0 lb

## 2024-02-02 DIAGNOSIS — K2950 Unspecified chronic gastritis without bleeding: Secondary | ICD-10-CM

## 2024-02-02 DIAGNOSIS — K3189 Other diseases of stomach and duodenum: Secondary | ICD-10-CM

## 2024-02-02 DIAGNOSIS — R1319 Other dysphagia: Secondary | ICD-10-CM

## 2024-02-02 DIAGNOSIS — K2100 Gastro-esophageal reflux disease with esophagitis, without bleeding: Secondary | ICD-10-CM | POA: Diagnosis present

## 2024-02-02 DIAGNOSIS — R112 Nausea with vomiting, unspecified: Secondary | ICD-10-CM

## 2024-02-02 DIAGNOSIS — K229 Disease of esophagus, unspecified: Secondary | ICD-10-CM | POA: Diagnosis not present

## 2024-02-02 MED ORDER — SODIUM CHLORIDE 0.9 % IV SOLN: 500.0000 mL | Freq: Once | INTRAVENOUS | Status: DC

## 2024-02-02 NOTE — Patient Instructions (Addendum)
 YOU HAD AN ENDOSCOPIC PROCEDURE TODAY AT THE [facility]:   Refer to the procedure report that was given to you for any specific questions about what was found during the examination.  If the procedure report does not answer your questions, please call your gastroenterologist to clarify.  If you requested that your care partner not be given the details of your procedure findings, then the procedure report has been included in a sealed envelope for you to review at your convenience later.  YOU SHOULD EXPECT: Some feelings of bloating in the abdomen. Passage of more gas than usual.  Walking can help get rid of the air that was put into your GI tract during the procedure and reduce the bloating. If you had a lower endoscopy (such as a colonoscopy or flexible sigmoidoscopy) you may notice spotting of blood in your stool or on the toilet paper. If you underwent a bowel prep for your procedure, you may not have a normal bowel movement for a few days.  Please Note:  You might notice some irritation and congestion in your nose or some drainage.  This is from the oxygen used during your procedure.  There is no need for concern and it should clear up in a day or so.  SYMPTOMS TO REPORT IMMEDIATELY:  Following upper endoscopy (EGD)  Vomiting of blood or coffee ground material  New chest pain or pain under the shoulder blades  Painful or persistently difficult swallowing  New shortness of breath  Fever of 100F or higher  Black, tarry-looking stools  For urgent or emergent issues, a gastroenterologist can be reached at any hour by calling [phone]. Do not use MyChart messaging for urgent concerns.    DIET:  We do recommend a small meal at first, but then you may proceed to your regular diet.  Drink plenty of fluids but you should avoid alcoholic beverages for 24 hours.  MEDICATIONS: Continue present medications.  FOLLOW UP: Await pathology results. Follow an antireflux regimen (see  handout). Obtain a 4 hour gastric emptying scan to exclude gastroparesis. Follow up with Dr. Leone Payor or Doug Sou next available appointment. Appointment made for Monday 04/01/2024 at 0900 am with Doug Sou.  Please see handouts given to you by your recovery nurse: Gastritis, Antireflux precautions.  Thank you for allowing [place] to provide for your healthcare needs today.  ACTIVITY:  You should plan to take it easy for the rest of today and you should NOT DRIVE or use heavy machinery until tomorrow (because of the sedation medicines used during the test).    FOLLOW UP: Our staff will call the number listed on your records the next business day following your procedure.  We will call around 7:15- 8:00 am to check on you and address any questions or concerns that you may have regarding the information given to you following your procedure. If we do not reach you, we will leave a message.     If any biopsies were taken you will be contacted by phone or by letter within the next 1-3 weeks.  Please call us at [phone] if you have not heard about the biopsies in 3 weeks.    SIGNATURES/CONFIDENTIALITY: You and/or your care partner have signed paperwork which will be entered into your electronic medical record.  These signatures attest to the fact that that the information above on your After Visit Summary has been reviewed and is understood.  Full responsibility of the confidentiality of this discharge information  lies with you and/or your care-partner.

## 2024-02-02 NOTE — Progress Notes (Signed)
 [facility] History and Physical   Primary Care Physician:  Murrell Converse, MD   Reason for Procedure:  Dysphagia, nausea, vomiting  Plan:    EGD  with possible interventions as needed     HPI: Michelle Rojas is a very pleasant 43 y.o. female here for EGD for dysphagia, nausea and vomiting.   The risks and benefits as well as alternatives of endoscopic procedure(s) have been discussed and reviewed. All questions answered. The patient agrees to proceed.    Past Medical History:  Diagnosis Date   AMA + Autoimmune hepatitis (HCC) 08/10/2023   Anxiety    PP treated with counseling no meds   Fibromyalgia    GERD (gastroesophageal reflux disease)    History of palpitations    negative work-up by cardiologist-- dr Eden Emms-- 2015   Hypertension    taking labetalol   Lupus (systemic lupus erythematosus) (HCC)    MCTD (mixed connective tissue disease) (HCC)    dx 07/ 2015--  rheumotologist--  dr Zenovia Jordan   Nausea    Numbness and tingling    Wears contact lenses     Past Surgical History:  Procedure Laterality Date   BIOPSY STOMACH     DILATION AND EVACUATION N/A 06/17/2016   Procedure: DILATATION AND EVACUATION;  Surgeon: [name], DO;  Location: [facility];  Service: Gynecology;  Laterality: N/A;  WTIH SUCTION   IRRIGATION AND DEBRIDEMENT ABSCESS N/A 11/28/2023   Procedure: IRRIGATION AND DEBRIDEMENT ABSCESS;  Surgeon: [name], MD;  Location: [facility];  Service: General;  Laterality: N/A;   LIVER BIOPSY     WISDOM TOOTH EXTRACTION  2012    Prior to Admission medications   Medication Sig Start Date End Date Taking? Authorizing Provider  alum & mag hydroxide-simeth (MAALOX/MYLANTA) 200-200-20 MG/5ML suspension Take 15 mLs by mouth every 6 (six) hours as needed for indigestion or heartburn. 01/21/24  Yes Fayrene Helper, PA-C  amLODipine (NORVASC) 10 MG tablet Take 10 mg by mouth daily. 08/08/23  Yes [provider]   atenolol (TENORMIN) 25 MG tablet Take 1 tablet by mouth daily. 10/17/23 10/16/24 Yes [provider]  famotidine (PEPCID) 40 MG tablet Take 1 tablet (40 mg total) by mouth 2 (two) times daily. 01/24/24  Yes Zehr, Princella Pellegrini, PA-C  hydrOXYzine (ATARAX) 25 MG tablet TAKE ONE TO TWO, AT BEDTIME AS NEEDED FOR ITCHING. Patient taking differently: Take 25 mg by mouth at bedtime. 09/20/23  Yes Iva Boop, MD  ondansetron (ZOFRAN-ODT) 4 MG disintegrating tablet Take 1 tablet (4 mg total) by mouth every 6 (six) hours. 01/26/24  Yes Zehr, Princella Pellegrini, PA-C  tacrolimus (PROGRAF) 1 MG capsule Take 2-3 mg by mouth 2 (two) times daily. Take 3 capsules (3 mg) in the morning and Take 2 capsules (2 mg) at bedtime   Yes [provider]  baclofen (LIORESAL) 10 MG tablet Take 10 mg by mouth 3 (three) times daily. 10/17/23   [provider]  BENLYSTA 200 MG/ML SOAJ Inject 200 mg into the skin once a week. 02/03/19   [provider]  FIASP 100 UNIT/ML SOLN Inject 6 Units into the skin 3 (three) times daily as needed (high blood sugar). Patient not taking: Reported on 02/02/2024 10/23/23   [provider]  fluticasone (FLONASE) 50 MCG/ACT nasal spray Place 1 spray into both nostrils daily as needed for allergies. 05/30/23   [provider]  Insulin Glargine (BASAGLAR KWIKPEN) 100 UNIT/ML Inject 40 Units into the skin  in the morning. Patient not taking: Reported on 01/16/2024 10/26/23 01/26/24  [provider]  meclizine (ANTIVERT) 25 MG tablet Take 1 tablet (25 mg total) by mouth 3 (three) times daily as needed for dizziness. 12/13/21   Tanda Rockers, PA-C  omeprazole (PRILOSEC) 40 MG capsule Take 40 mg by mouth daily. 01/10/24 01/09/25  [provider]  valACYclovir (VALTREX) 500 MG tablet Take 500 mg by mouth as needed (fever blisters).    [provider]  Vitamin D, Ergocalciferol, (DRISDOL) 1.25 MG (50000 UNIT) CAPS capsule Take 50,000 Units by  mouth every 7 (seven) days. 03/01/21   [provider]    Current Outpatient Medications  Medication Sig Dispense Refill   alum & mag hydroxide-simeth (MAALOX/MYLANTA) 200-200-20 MG/5ML suspension Take 15 mLs by mouth every 6 (six) hours as needed for indigestion or heartburn. 355 mL 0   amLODipine (NORVASC) 10 MG tablet Take 10 mg by mouth daily.     atenolol (TENORMIN) 25 MG tablet Take 1 tablet by mouth daily.     famotidine (PEPCID) 40 MG tablet Take 1 tablet (40 mg total) by mouth 2 (two) times daily. 60 tablet 5   hydrOXYzine (ATARAX) 25 MG tablet TAKE ONE TO TWO, AT BEDTIME AS NEEDED FOR ITCHING. (Patient taking differently: Take 25 mg by mouth at bedtime.) 180 tablet 0   ondansetron (ZOFRAN-ODT) 4 MG disintegrating tablet Take 1 tablet (4 mg total) by mouth every 6 (six) hours. 30 tablet 1   tacrolimus (PROGRAF) 1 MG capsule Take 2-3 mg by mouth 2 (two) times daily. Take 3 capsules (3 mg) in the morning and Take 2 capsules (2 mg) at bedtime     baclofen (LIORESAL) 10 MG tablet Take 10 mg by mouth 3 (three) times daily.     BENLYSTA 200 MG/ML SOAJ Inject 200 mg into the skin once a week.     FIASP 100 UNIT/ML SOLN Inject 6 Units into the skin 3 (three) times daily as needed (high blood sugar). (Patient not taking: Reported on 02/02/2024)     fluticasone (FLONASE) 50 MCG/ACT nasal spray Place 1 spray into both nostrils daily as needed for allergies.     Insulin Glargine (BASAGLAR KWIKPEN) 100 UNIT/ML Inject 40 Units into the skin in the morning. (Patient not taking: Reported on 01/16/2024)     meclizine (ANTIVERT) 25 MG tablet Take 1 tablet (25 mg total) by mouth 3 (three) times daily as needed for dizziness. 30 tablet 0   omeprazole (PRILOSEC) 40 MG capsule Take 40 mg by mouth daily.     valACYclovir (VALTREX) 500 MG tablet Take 500 mg by mouth as needed (fever blisters).     Vitamin D, Ergocalciferol, (DRISDOL) 1.25 MG (50000 UNIT) CAPS capsule Take 50,000 Units by mouth every 7  (seven) days.     Current Facility-Administered Medications  Medication Dose Route Frequency Provider Last Rate Last Admin   0.9 %  sodium chloride infusion  500 mL Intravenous Once Napoleon Form, MD        Allergies as of 02/02/2024   (No Known Allergies)    Family History  Problem Relation Age of Onset   Hypertension Mother    Hypertension Father    Healthy Brother    Healthy Brother    Hypertension Maternal Grandmother    Healthy Daughter    Healthy Daughter    Colon cancer Neg Hx    Esophageal cancer Neg Hx    Stomach cancer Neg Hx     Social  History   Socioeconomic History   Marital status: Married    Spouse name: Not on file   Number of children: 2   Years of education: some college   Highest education level: Not on file  Occupational History   Occupation: Home maker  Tobacco Use   Smoking status: Never   Smokeless tobacco: Never  Vaping Use   Vaping status: Never Used  Substance and Sexual Activity   Alcohol use: Yes    Alcohol/week: 1.0 standard drink of alcohol    Types: 1 Glasses of wine per week    Comment: social settings   Drug use: No   Sexual activity: Yes  Other Topics Concern   Not on file  Social History Narrative   Lives at home with husband and children.   Right-handed.   Caffeine use:  5 cups per day.   Social Drivers of Corporate investment banker Strain: High Risk (08/08/2023)   Received from [facility]   Overall Financial Resource Strain (CARDIA)    Difficulty of Paying Living Expenses: Hard  Food Insecurity: Low Risk  (01/08/2024)   Received from [facility]   Hunger Vital Sign    Worried About Running Out of Food in the Last Year: Never true    Ran Out of Food in the Last Year: Never true  Transportation Needs: No Transportation Needs (01/08/2024)   Received from [facility]    In the past 12 months, has lack of reliable transportation kept you from medical appointments, meetings, work or from  getting things needed for daily living? : No  Physical Activity: Insufficiently Active (08/08/2023)   Received from [facility]   Exercise Vital Sign    Days of Exercise per Week: 1 day    Minutes of Exercise per Session: 20 min  Stress: Stress Concern Present (08/08/2023)   Received from [facility] - Occupational Stress Questionnaire    Feeling of Stress : Very much  Social Connections: Socially Integrated (08/08/2023)   Received from [facility]   Social Network    How would you rate your social network (family, work, friends)?: Good participation with social networks  Intimate Partner Violence: Patient Declined (11/28/2023)   Humiliation, Afraid, Rape, and Kick questionnaire    Fear of Current or Ex-Partner: Patient declined    Emotionally Abused: Patient declined    Physically Abused: Patient declined    Sexually Abused: Patient declined    Review of Systems:  All other review of systems negative except as mentioned in the HPI.  Physical Exam: Vital signs in last 24 hours: BP (!) 131/92   Pulse 67   Temp 98.3 F (36.8 C)   Ht 5\' 6"  (1.676 m)   Wt 192 lb (87.1 kg)   LMP 01/25/2024 (Exact Date)   SpO2 99%   BMI 30.99 kg/m  General:   Alert, NAD Lungs:  Clear .   Heart:  Regular rate and rhythm Abdomen:  Soft, nontender and nondistended. Neuro/Psych:  Alert and cooperative. Normal mood and affect. A and O x 3  Reviewed labs, radiology imaging, old records and pertinent past GI work up  Patient is appropriate for planned procedure(s) and anesthesia in an ambulatory setting   K. Scherry Ran , MD [phone]

## 2024-02-02 NOTE — Op Note (Addendum)
 [facility] Patient Name: Michelle Rojas Procedure Date: 02/02/2024 1:15 PM MRN: [id number] Endoscopist: Napoleon Form , MD, [id number] Age: 43 Referring MD:  Date of Birth: 05-05-1981 Gender: Female Account #: [id number] Procedure:                Upper GI endoscopy Indications:              Dysphagia, Persistent vomiting of unknown cause Medicines:                Monitored Anesthesia Care Procedure:                Pre-Anesthesia Assessment:                           - Prior to the procedure, a History and Physical                            was performed, and patient medications and                            allergies were reviewed. The patient's tolerance of                            previous anesthesia was also reviewed. The risks                            and benefits of the procedure and the sedation                            options and risks were discussed with the patient.                            All questions were answered, and informed consent                            was obtained. Prior Anticoagulants: The patient has                            taken no anticoagulant or antiplatelet agents. ASA                            Grade Assessment: II - A patient with mild systemic                            disease. After reviewing the risks and benefits,                            the patient was deemed in satisfactory condition to                            undergo the procedure.                           After obtaining informed consent, the endoscope was  passed under direct vision. Throughout the                            procedure, the patient's blood pressure, pulse, and                            oxygen saturations were monitored continuously. The                            Olympus scope [id number] was introduced through the                            mouth, and advanced to the second part of duodenum.                             The upper GI endoscopy was accomplished without                            difficulty. The patient tolerated the procedure                            well. Scope In: Scope Out: Findings:                 LA Grade B (one or more mucosal breaks greater than                            5 mm, not extending between the tops of two mucosal                            folds) esophagitis with no bleeding was found 37 to                            38 cm from the incisors. Biopsies were taken with a                            cold forceps for histology. Biopsies were obtained                            from the proximal and distal esophagus with cold                            forceps for histology of suspected eosinophilic                            esophagitis.                           No other endoscopic abnormality was evident in the                            esophagus to explain the patient's complaint of  dysphagia.                           Patchy mild inflammation characterized by                            congestion (edema), erythema, friability and                            granularity was found in the entire examined                            stomach. Biopsies were taken with a cold forceps                            for Helicobacter pylori testing.                           The cardia and gastric fundus were normal on                            retroflexion.                           The first portion of the duodenum and second                            portion of the duodenum were normal. Biopsies for                            histology were taken with a cold forceps for                            evaluation of celiac disease. Complications:            No immediate complications. Estimated Blood Loss:     Estimated blood loss was minimal. Impression:               - LA Grade B reflux esophagitis with no bleeding.                            Biopsied.                            - No other endoscopic esophageal abnormality to                            explain patient's dysphagia.                           - Gastritis. Biopsied.                           - Normal first portion of the duodenum and second                            portion of the duodenum. Biopsied.                           -  Biopsies were taken with a cold forceps for                            evaluation of eosinophilic esophagitis. Recommendation:           - Patient has a contact number available for                            emergencies. The signs and symptoms of potential                            delayed complications were discussed with the                            patient. Return to normal activities tomorrow.                            Written discharge instructions were provided to the                            patient.                           - Resume previous diet.                           - Continue present medications.                           - Await pathology results.                           - Follow an antireflux regimen.                           - No obvious lesion in upper GI tract to explain                            the symptoms                           - Obtain 4 hour gastric emptying scan to exclude                            gastroparesis                           - Stop PPI                           - Use H2 blocker pepcid 20mg  BID as needed                           - Follow up with Dr.Gessner or Doug Sou next                            available appointment Napoleon Form,  MD 02/02/2024 1:58:23 PM This report has been signed electronically.

## 2024-02-02 NOTE — Progress Notes (Signed)
 A/o x 3, VSS, gd SR's, pleased with anesthesia, report to RN

## 2024-02-02 NOTE — Progress Notes (Signed)
 Pt's states no medical or surgical changes since previsit or office visit.

## 2024-02-05 ENCOUNTER — Other Ambulatory Visit: Payer: Self-pay | Admitting: *Deleted

## 2024-02-05 ENCOUNTER — Telehealth: Payer: Self-pay

## 2024-02-05 DIAGNOSIS — R112 Nausea with vomiting, unspecified: Secondary | ICD-10-CM

## 2024-02-05 DIAGNOSIS — K219 Gastro-esophageal reflux disease without esophagitis: Secondary | ICD-10-CM

## 2024-02-05 NOTE — Telephone Encounter (Signed)
  Follow up Call-     02/02/2024   12:44 PM 09/08/2023   10:32 AM  Call back number  Post procedure Call Back phone  # [phone]  Permission to leave phone message Yes Yes     Patient questions:  Do you have a fever, pain , or abdominal swelling? No. Pain Score  0 *  Have you tolerated food without any problems? No.  Pt states she is still not able to eat much but this is not new from prior to procedure.  Have you been able to return to your normal activities? Yes.    Do you have any questions about your discharge instructions: Diet   No. Medications  No. Follow up visit  No.  Do you have questions or concerns about your Care? No.  Actions: * If pain score is 4 or above: No action needed, pain <4.

## 2024-02-07 LAB — SURGICAL PATHOLOGY

## 2024-02-09 ENCOUNTER — Telehealth: Payer: Self-pay | Admitting: Gastroenterology

## 2024-02-09 NOTE — Telephone Encounter (Signed)
 Patient had EGD on 02/02/24 with Dr Lavon Paganini. Pathology results have not been reviewed by the provider. Called the patient. No answer. Left her a message that the provider will review the results and mail a letter or call with any recommendations. This typically takes up to 2 weeks.

## 2024-02-09 NOTE — Telephone Encounter (Signed)
 Inbound cal fro patient in regards to lab work. Requesting f/u call. Please advise.   Thank you

## 2024-02-12 ENCOUNTER — Ambulatory Visit (HOSPITAL_COMMUNITY)
Admission: RE | Admit: 2024-02-12 | Discharge: 2024-02-12 | Disposition: A | Discharge status: Home / Self Care | Source: Ambulatory Visit | Attending: Gastroenterology | Admitting: Gastroenterology

## 2024-02-12 DIAGNOSIS — K219 Gastro-esophageal reflux disease without esophagitis: Secondary | ICD-10-CM | POA: Diagnosis present

## 2024-02-12 DIAGNOSIS — R112 Nausea with vomiting, unspecified: Secondary | ICD-10-CM | POA: Diagnosis present

## 2024-02-12 MED ORDER — TECHNETIUM TC 99M SULFUR COLLOID
1.9000 | Freq: Once | INTRAVENOUS | Status: AC | PRN
  Administered 2024-02-12: 1.9 via INTRAVENOUS

## 2024-02-20 ENCOUNTER — Encounter (HOSPITAL_COMMUNITY)

## 2024-02-22 ENCOUNTER — Encounter: Payer: Self-pay | Admitting: Gastroenterology

## 2024-02-27 ENCOUNTER — Other Ambulatory Visit: Payer: Self-pay | Admitting: Internal Medicine

## 2024-02-27 NOTE — Telephone Encounter (Signed)
 Please advise Sir, thank you.

## 2024-02-28 ENCOUNTER — Encounter: Admitting: Internal Medicine

## 2024-03-28 ENCOUNTER — Encounter: Payer: Self-pay | Admitting: Gastroenterology

## 2024-04-01 ENCOUNTER — Ambulatory Visit: Admitting: Gastroenterology

## 2024-05-25 ENCOUNTER — Other Ambulatory Visit: Payer: Self-pay | Admitting: Internal Medicine

## 2024-06-06 ENCOUNTER — Other Ambulatory Visit: Payer: Self-pay | Admitting: Family Medicine

## 2024-06-06 ENCOUNTER — Other Ambulatory Visit: Payer: Self-pay | Admitting: Interventional Radiology

## 2024-06-06 DIAGNOSIS — D259 Leiomyoma of uterus, unspecified: Secondary | ICD-10-CM

## 2024-06-13 ENCOUNTER — Ambulatory Visit
Admission: RE | Admit: 2024-06-13 | Discharge: 2024-06-13 | Disposition: A | Discharge status: Home / Self Care | Source: Ambulatory Visit | Attending: Interventional Radiology | Admitting: Interventional Radiology

## 2024-06-13 DIAGNOSIS — D259 Leiomyoma of uterus, unspecified: Secondary | ICD-10-CM

## 2024-06-13 MED ORDER — GADOPICLENOL 0.5 MMOL/ML IV SOLN
8.0000 mL | Freq: Once | INTRAVENOUS | Status: AC | PRN
  Administered 2024-06-13: 8 mL via INTRAVENOUS

## 2024-06-19 ENCOUNTER — Ambulatory Visit
Admission: RE | Admit: 2024-06-19 | Discharge: 2024-06-19 | Disposition: A | Discharge status: Home / Self Care | Source: Ambulatory Visit | Attending: Family Medicine | Admitting: Family Medicine

## 2024-06-19 DIAGNOSIS — D259 Leiomyoma of uterus, unspecified: Secondary | ICD-10-CM

## 2024-06-19 HISTORY — PX: IR RADIOLOGIST EVAL & MGMT: IMG5224

## 2024-06-19 NOTE — Consult Note (Signed)
 Chief Complaint: Patient was seen in consultation today for symptomatic fibroids at the request of Morris,Megan A  Referring Physician(s): Morris,Megan A  History of Present Illness: Michelle Rojas is a 43 y.o. female G3P2 history of uterine fibroids symptomatic x5 years with significant menorrhagia for up to 30d, no anemia. TYpical menstrual cycle q month lasting 5-7days, 4 heavy days. Users both tampons and pads with Depends, changes q 7-8d with interperiod bleeding. Pain with her period. Some crampy bulk symptoms and pressure. No prior fibroid therapies or surgeries. No gyn infections. No plans for future pregnancy (IUD removed 1 yr ago). Normal pap smear 05/09/2019. No endometrial biopsy.  Surgery was planned but delayed due to autoimmune hepatitis. Past Medical History:  Diagnosis Date   AMA + Autoimmune hepatitis (HCC) 08/10/2023   Anxiety    PP treated with counseling no meds   Fibromyalgia    GERD (gastroesophageal reflux disease)    History of palpitations    negative work-up by cardiologist-- dr delford-- 2015   Hypertension    taking labetalol    Lupus (systemic lupus erythematosus) (HCC)    MCTD (mixed connective tissue disease) (HCC)    dx 07/ 2015--  rheumotologist--  dr jon jacob   Nausea    Numbness and tingling    Wears contact lenses     Past Surgical History:  Procedure Laterality Date   BIOPSY STOMACH     DILATION AND EVACUATION N/A 06/17/2016   Procedure: DILATATION AND EVACUATION;  Surgeon: Duwaine Blumenthal, DO;  Location: [facility];  Service: Gynecology;  Laterality: N/A;  WTIH SUCTION   IRRIGATION AND DEBRIDEMENT ABSCESS N/A 11/28/2023   Procedure: IRRIGATION AND DEBRIDEMENT ABSCESS;  Surgeon: Vernetta Berg, MD;  Location: [facility];  Service: General;  Laterality: N/A;   LIVER BIOPSY     WISDOM TOOTH EXTRACTION  2012    Allergies: Patient has no known allergies.  Medications: Prior to Admission medications   Medication Sig  Start Date End Date Taking? Authorizing Provider  alum & mag hydroxide-simeth (MAALOX/MYLANTA) 200-200-20 MG/5ML suspension Take 15 mLs by mouth every 6 (six) hours as needed for indigestion or heartburn. 01/21/24   Nivia Colon, PA-C  amLODipine  (NORVASC ) 10 MG tablet Take 10 mg by mouth daily. 08/08/23   [provider]  atenolol  (TENORMIN ) 25 MG tablet Take 1 tablet by mouth daily. 10/17/23 10/16/24  [provider]  baclofen  (LIORESAL ) 10 MG tablet Take 10 mg by mouth 3 (three) times daily. 10/17/23   [provider]  BENLYSTA 200 MG/ML SOAJ Inject 200 mg into the skin once a week. 02/03/19   [provider]  famotidine  (PEPCID ) 40 MG tablet Take 1 tablet (40 mg total) by mouth 2 (two) times daily. 01/24/24   Zehr, Jessica D, PA-C  FIASP  100 UNIT/ML SOLN Inject 6 Units into the skin 3 (three) times daily as needed (high blood sugar). Patient not taking: Reported on 02/02/2024 10/23/23   [provider]  fluticasone  (FLONASE ) 50 MCG/ACT nasal spray Place 1 spray into both nostrils daily as needed for allergies. 05/30/23   [provider]  hydrOXYzine  (ATARAX ) 25 MG tablet TAKE ONE TO TWO, AT BEDTIME AS NEEDED FOR ITCHING. 05/27/24   Avram Lupita BRAVO, MD  Insulin  Glargine (BASAGLAR  KWIKPEN) 100 UNIT/ML Inject 40 Units into the skin in the morning. Patient not taking: Reported on 01/16/2024 10/26/23 01/26/24  [provider]  meclizine  (ANTIVERT ) 25 MG tablet Take 1 tablet (25 mg total) by mouth 3 (  three) times daily as needed for dizziness. 12/13/21   Shepard, Margaux, PA-C  omeprazole  (PRILOSEC) 40 MG capsule Take 40 mg by mouth daily. 01/10/24 01/09/25  [provider]  ondansetron  (ZOFRAN -ODT) 4 MG disintegrating tablet Take 1 tablet (4 mg total) by mouth every 6 (six) hours. 01/26/24   Zehr, Jessica D, PA-C  tacrolimus  (PROGRAF ) 1 MG capsule Take 2-3 mg by mouth 2 (two) times daily. Take 3 capsules (3 mg) in the morning and Take 2 capsules (2 mg)  at bedtime    [provider]  valACYclovir (VALTREX) 500 MG tablet Take 500 mg by mouth as needed (fever blisters).    [provider]  Vitamin D, Ergocalciferol, (DRISDOL) 1.25 MG (50000 UNIT) CAPS capsule Take 50,000 Units by mouth every 7 (seven) days. 03/01/21   [provider]     Family History  Problem Relation Age of Onset   Hypertension Mother    Hypertension Father    Healthy Brother    Healthy Brother    Hypertension Maternal Grandmother    Healthy Daughter    Healthy Daughter    Colon cancer Neg Hx    Esophageal cancer Neg Hx    Stomach cancer Neg Hx     Social History   Socioeconomic History   Marital status: Married    Spouse name: Not on file   Number of children: 2   Years of education: some college   Highest education level: Not on file  Occupational History   Occupation: Home maker  Tobacco Use   Smoking status: Never   Smokeless tobacco: Never  Vaping Use   Vaping status: Never Used  Substance and Sexual Activity   Alcohol use: Yes    Alcohol/week: 1.0 standard drink of alcohol    Types: 1 Glasses of wine per week    Comment: social settings   Drug use: No   Sexual activity: Yes  Other Topics Concern   Not on file  Social History Narrative   Lives at home with husband and children.   Right-handed.   Caffeine use:  5 cups per day.   Social Drivers of Health   Financial Resource Strain: High Risk (08/08/2023)   Received from [facility]   Overall Financial Resource Strain (CARDIA)    Difficulty of Paying Living Expenses: Hard  Food Insecurity: Low Risk  (01/08/2024)   Received from [facility]   Hunger Vital Sign    Within the past 12 months, you worried that your food would run out before you got money to buy more: Never true    Within the past 12 months, the food you bought just didn't last and you didn't have money to get more. : Never true  Transportation Needs: No Transportation Needs (01/08/2024)   Received  from [facility]    In the past 12 months, has lack of reliable transportation kept you from medical appointments, meetings, work or from getting things needed for daily living? : No  Physical Activity: Insufficiently Active (08/08/2023)   Received from [facility]   Exercise Vital Sign    On average, how many days per week do you engage in moderate to strenuous exercise (like a brisk walk)?: 1 day    On average, how many minutes do you engage in exercise at this level?: 20 min  Stress: Stress Concern Present (08/08/2023)   Received from [facility] - Occupational Stress Questionnaire  Feeling of Stress : Very much  Social Connections: Socially Integrated (08/08/2023)   Received from [facility]   Social Network    How would you rate your social network (family, work, friends)?: Good participation with social networks    ECOG Status: 2 - Symptomatic, <50% confined to bed  Review of Systems: A 12 point ROS discussed and pertinent positives are indicated in the HPI above.  All other systems are negative.  Review of Systems  Vital Signs: BP (!) 149/94 (BP Location: Left Arm, Patient Position: Sitting, Cuff Size: Normal)   Pulse 83   Temp 98.9 F (37.2 C) (Oral)   Resp 18     Physical Exam  Constitutional: Oriented to person, place, and time. Well-developed and well-nourished. No distress.   HENT:  Head: Normocephalic and atraumatic.  Eyes: Conjunctivae and EOM are normal. Right eye exhibits no discharge. Left eye exhibits no discharge. No scleral icterus.  Neck: No JVD present.  Pulmonary/Chest: Effort normal. No stridor. No respiratory distress.  Abdomen: soft, non distended Neurological:  alert and oriented to person, place, and time.  Skin: Skin is warm and dry.  not diaphoretic.  Psychiatric:   normal mood and affect.   behavior is normal. Judgment and thought content normal.       Imaging: MR  PELVIS W WO CONTRAST Result Date: 06/15/2024 CLINICAL DATA:  Uterine leiomyoma EXAM: MRI PELVIS WITHOUT AND WITH CONTRAST TECHNIQUE: Multiplanar multisequence MR imaging of the pelvis was performed both before and after administration of intravenous contrast. CONTRAST:  8 mL Vueway  gadolinium contrast IV COMPARISON:  CT pelvis, 11/27/2023 FINDINGS: Urinary Tract:  No abnormality visualized. Bowel:  Unremarkable visualized pelvic bowel loops. Vascular/Lymphatic: No pathologically enlarged lymph nodes. No significant vascular abnormality seen. Reproductive: Large exophytic fibroid arising from the anterior uterine fundus with heterogeneous internal contrast enhancement, measuring 8.4 x 7.2 x 7.3 cm (series 3, image 23, series 6, image 16). Additional much smaller intramural and subserosal fibroids of the uterine fundus. Multiple small cysts of the endometrium. Small nabothian cysts of the cervix. Small functional ovarian follicles. Small left-sided Bartholin cyst, incompletely imaged (series 3, image 40). Other:  Trace free fluid in the pelvis. Musculoskeletal: No suspicious bone lesions identified. IMPRESSION: 1. Large exophytic fibroid arising from the anterior uterine fundus with heterogeneous internal contrast enhancement, measuring 8.4 x 7.2 x 7.3 cm. 2. Additional much smaller intramural and subserosal fibroids of the uterine fundus. 3. Multiple small cysts of the endometrium. Electronically Signed   By: Marolyn JONETTA Jaksch M.D.   On: 06/15/2024 07:33  The exophytic fibroid has a broad based attachment.  Labs:  CBC: Recent Labs    11/27/23 1242 11/29/23 0448 11/30/23 0436 01/21/24 1527  WBC 11.4* 9.4 8.7 5.4  HGB 11.1* 10.9* 11.5* 11.8*  HCT 34.4* 34.6* 36.4 38.2  PLT 321 341 362 346    COAGS: Recent Labs    08/16/23 0434 08/17/23 0418 08/21/23 1104 08/28/23 0759  INR 1.0 1.1 1.0 0.9    BMP: Recent Labs    11/27/23 1242 11/29/23 0448 11/30/23 0436 01/21/24 1527  NA 137 135 133* 145   K 3.8 3.6 3.9 3.5  CL 104 100 100 110  CO2 25 26 22 25   GLUCOSE 236* 275* 278* 96  BUN 11 13 16  23*  CALCIUM 9.0 9.0 8.8* 9.6  CREATININE 0.65 0.73 0.74 1.23*  GFRNONAA >60 >60 >60 56*    LIVER FUNCTION TESTS: Recent Labs    08/28/23 0759 10/29/23 1830 11/27/23 1242 01/21/24  1527  BILITOT 3.3* 0.7 0.4 1.1  AST 228* 26 14* 22  ALT 473* 112* 30 10  ALKPHOS 188* 155* 90 60  PROT 6.6 6.9 6.7 7.6  ALBUMIN 3.6 3.4* 3.2* 4.1    TUMOR MARKERS: No results for input(s): AFPTM, CEA, CA199, CHROMGRNA in the last 8760 hours.  Assessment and Plan:  My impression is that this patient's menorrhagia and pain are  likely secondary to uterine fibroids. We spent the majority of the consultation discussing the pathophysiology of uterine leiomyomata, natural history, anticipated  involution post menopause, and treatment options. We discussed myomectomy, hysterectomy, and uterine fibroid embolization. I described the technique of UFE, anticipated benefits, possible risks and complications including but not limited to bleeding, infection, vessel damage, nontarget embolization, and incomplete symptom relief. We discussed the 90% clinical success rate historically and at our experience with UFE. We discussed the post procedure course and time course of symptom resolution. We discussed the need for continued gynecologic care.  She seemed to understand and did ask appropriate questions, which were answered.  Based on her evaluation thus far, I think she would be an appropriate candidate for uterine fibroid embolization because of her symptomatology and  uterine fibroids. The MRI shows multiple fibroids,  no pedunculated fibroids on a narrow stalk.   After our discussion, the patient was motivated proceed. Accordingly, we can schedule at her convenience as an outpatient to proceed with UFE.   Thank you for this interesting consult.  I greatly enjoyed meeting Catha A Lekas and look forward to  participating in their care.  A copy of this report was sent to the requesting provider on this date.  Electronically Signed: Dayne Rashonda Warrior 06/19/2024, 4:18 PM   I spent a total of  30 Minutes   in face to face in clinical consultation, greater than 50% of which was counseling/coordinating care for symptomatic uterine fibroids.

## 2024-06-25 ENCOUNTER — Other Ambulatory Visit (HOSPITAL_COMMUNITY): Payer: Self-pay | Admitting: Interventional Radiology

## 2024-06-25 DIAGNOSIS — D259 Leiomyoma of uterus, unspecified: Secondary | ICD-10-CM

## 2024-07-05 ENCOUNTER — Other Ambulatory Visit: Payer: Self-pay | Admitting: Radiology

## 2024-07-05 DIAGNOSIS — D259 Leiomyoma of uterus, unspecified: Secondary | ICD-10-CM

## 2024-07-06 NOTE — H&P (Incomplete)
 Chief Complaint: Symptomatic uterine fibroids; referred for bilateral uterine artery embolization  Referring Provider(s): Morris,M  Supervising Physician: Johann Sieving  Patient Status: [facility] - Out-pt  History of Present Illness: Michelle Rojas is a 43 y.o. female with PMH sig for AIH, anxiety, fibromyalgia, GERD, HTN, lupus, mixed connective tissue disease and symptomatic uterine fibroids. She underwent consultation with Dr. Johann on 06/19/24 to discuss treatment options for her symptomatic uterine fibroids and was deemed an appropriate  candidate for bilateral uterine artery embolization. She presents today for the procedure.   *** Patient is Full Code  Past Medical History:  Diagnosis Date   AMA + Autoimmune hepatitis (HCC) 08/10/2023   Anxiety    PP treated with counseling no meds   Fibromyalgia    GERD (gastroesophageal reflux disease)    History of palpitations    negative work-up by cardiologist-- dr delford-- 2015   Hypertension    taking labetalol    Lupus (systemic lupus erythematosus) (HCC)    MCTD (mixed connective tissue disease) (HCC)    dx 07/ 2015--  rheumotologist--  dr jon jacob   Nausea    Numbness and tingling    Wears contact lenses     Past Surgical History:  Procedure Laterality Date   BIOPSY STOMACH     DILATION AND EVACUATION N/A 06/17/2016   Procedure: DILATATION AND EVACUATION;  Surgeon: Duwaine Blumenthal, DO;  Location: [facility];  Service: Gynecology;  Laterality: N/A;  WTIH SUCTION   IR RADIOLOGIST EVAL & MGMT  06/19/2024   IRRIGATION AND DEBRIDEMENT ABSCESS N/A 11/28/2023   Procedure: IRRIGATION AND DEBRIDEMENT ABSCESS;  Surgeon: Vernetta Berg, MD;  Location: [facility];  Service: General;  Laterality: N/A;   LIVER BIOPSY     WISDOM TOOTH EXTRACTION  2012    Allergies: Patient has no known allergies.  Medications: Prior to Admission medications   Medication Sig Start Date End Date Taking? Authorizing Provider  alum  & mag hydroxide-simeth (MAALOX/MYLANTA) 200-200-20 MG/5ML suspension Take 15 mLs by mouth every 6 (six) hours as needed for indigestion or heartburn. 01/21/24   Nivia Colon, PA-C  amLODipine  (NORVASC ) 10 MG tablet Take 10 mg by mouth daily. 08/08/23   [provider]  atenolol  (TENORMIN ) 25 MG tablet Take 1 tablet by mouth daily. 10/17/23 10/16/24  [provider]  baclofen  (LIORESAL ) 10 MG tablet Take 10 mg by mouth 3 (three) times daily. 10/17/23   [provider]  BENLYSTA 200 MG/ML SOAJ Inject 200 mg into the skin once a week. 02/03/19   [provider]  famotidine  (PEPCID ) 40 MG tablet Take 1 tablet (40 mg total) by mouth 2 (two) times daily. 01/24/24   Zehr, Jessica D, PA-C  FIASP  100 UNIT/ML SOLN Inject 6 Units into the skin 3 (three) times daily as needed (high blood sugar). Patient not taking: Reported on 02/02/2024 10/23/23   [provider]  fluticasone  (FLONASE ) 50 MCG/ACT nasal spray Place 1 spray into both nostrils daily as needed for allergies. 05/30/23   [provider]  hydrOXYzine  (ATARAX ) 25 MG tablet TAKE ONE TO TWO, AT BEDTIME AS NEEDED FOR ITCHING. 05/27/24   Avram Lupita BRAVO, MD  Insulin  Glargine (BASAGLAR  KWIKPEN) 100 UNIT/ML Inject 40 Units into the skin in the morning. Patient not taking: Reported on 01/16/2024 10/26/23 01/26/24  [provider]  meclizine  (ANTIVERT ) 25 MG tablet Take 1 tablet (25 mg total) by mouth 3 (three) times daily as needed for dizziness. 12/13/21   Shepard Clinch, PA-C  omeprazole  (PRILOSEC) 40 MG capsule Take 40 mg by mouth daily. 01/10/24 01/09/25  [provider]  ondansetron  (ZOFRAN -ODT) 4 MG disintegrating tablet Take 1 tablet (4 mg total) by mouth every 6 (six) hours. 01/26/24   Zehr, Jessica D, PA-C  tacrolimus  (PROGRAF ) 1 MG capsule Take 2-3 mg by mouth 2 (two) times daily. Take 3 capsules (3 mg) in the morning and Take 2 capsules (2 mg) at bedtime    [provider]  valACYclovir  (VALTREX) 500 MG tablet Take 500 mg by mouth as needed (fever blisters).    [provider]  Vitamin D, Ergocalciferol, (DRISDOL) 1.25 MG (50000 UNIT) CAPS capsule Take 50,000 Units by mouth every 7 (seven) days. 03/01/21   [provider]     Family History  Problem Relation Age of Onset   Hypertension Mother    Hypertension Father    Healthy Brother    Healthy Brother    Hypertension Maternal Grandmother    Healthy Daughter    Healthy Daughter    Colon cancer Neg Hx    Esophageal cancer Neg Hx    Stomach cancer Neg Hx     Social History   Socioeconomic History   Marital status: Married    Spouse name: Not on file   Number of children: 2   Years of education: some college   Highest education level: Not on file  Occupational History   Occupation: Home maker  Tobacco Use   Smoking status: Never   Smokeless tobacco: Never  Vaping Use   Vaping status: Never Used  Substance and Sexual Activity   Alcohol use: Yes    Alcohol/week: 1.0 standard drink of alcohol    Types: 1 Glasses of wine per week    Comment: social settings   Drug use: No   Sexual activity: Yes  Other Topics Concern   Not on file  Social History Narrative   Lives at home with husband and children.   Right-handed.   Caffeine use:  5 cups per day.   Social Drivers of Health   Financial Resource Strain: High Risk (08/08/2023)   Received from [facility]   Overall Financial Resource Strain (CARDIA)    Difficulty of Paying Living Expenses: Hard  Food Insecurity: Low Risk  (01/08/2024)   Received from [facility]   Hunger Vital Sign    Within the past 12 months, you worried that your food would run out before you got money to buy more: Never true    Within the past 12 months, the food you bought just didn't last and you didn't have money to get more. : Never true  Transportation Needs: No Transportation Needs (01/08/2024)   Received from [facility]    In the past  12 months, has lack of reliable transportation kept you from medical appointments, meetings, work or from getting things needed for daily living? : No  Physical Activity: Insufficiently Active (08/08/2023)   Received from [facility]   Exercise Vital Sign    On average, how many days per week do you engage in moderate to strenuous exercise (like a brisk walk)?: 1 day    On average, how many minutes do you engage in exercise at this level?: 20 min  Stress: Stress Concern Present (08/08/2023)   Received from [facility] of Occupational Health - Occupational Stress Questionnaire    Feeling of Stress : Very much  Social Connections: Socially Integrated (08/08/2023)  Received from [facility]   Social Network    How would you rate your social network (family, work, friends)?: Good participation with social networks      Review of Systems  Vital Signs:   Advance Care Plan: no documents on file  Physical Exam  Imaging: IR Radiologist Eval & Mgmt Result Date: 06/28/2024 : Chief Complaint:Patient was seen in consultation today for symptomatic fibroids at the request of Morris,Megan AReferring Physician(s):Morris,Megan AHistory of Present Illness:Michelle Rojas is a 43 y.o. female G3P2 history of uterine fibroids symptomatic x5 years with significant menorrhagia for up to 30d, no anemia. TYpical menstrual cycle q month lasting 5-7days, 4 heavy days. Users both tampons and pads with Depends, changes q 7-8d with interperiod bleeding. Pain with her period. Some crampy bulk symptoms and pressure. No prior fibroid therapies or surgeries. No gyn infections. No plans for future pregnancy (IUD removed 1 yr ago). Normal pap smear 05/09/2019. No endometrial biopsy. Surgery was planned but delayed due to autoimmune hepatitis. Past Medical History: Diagnosis * : Date . * : AMA + Autoimmune hepatitis (HCC) * : 08/10/2023 . * : Anxiety * : * : PP treated with counseling no meds . * :  Fibromyalgia * : . * : GERD (gastroesophageal reflux disease) * : . * : History of palpitations * : * : negative work-up by cardiologist-- dr delford-- 2015 . * : Hypertension * : * : taking labetalol  . * : Lupus (systemic lupus erythematosus) (HCC) * : . * : MCTD (mixed connective tissue disease) (HCC) * : * : dx 07/ 2015--  rheumotologist--  dr jon jacob . * : Nausea * : . * : Numbness and tingling * : . * : Wears contact lenses * : Past Surgical History: Procedure * : Laterality * : Date . * : BIOPSY STOMACH * : * : . * : DILATION AND EVACUATION * : N/A * : 06/17/2016 * : Procedure: DILATATION AND EVACUATION; Surgeon: Duwaine Blumenthal, DO; Location: [facility]; Service: Gynecology; Laterality: N/A; WTIH SUCTION . * : IRRIGATION AND DEBRIDEMENT ABSCESS * : N/A * : 11/28/2023 * : Procedure: IRRIGATION AND DEBRIDEMENT ABSCESS; Surgeon: Vernetta Berg, MD; Location: [facility]; Service: General; Laterality: N/A; . * : LIVER BIOPSY * : * : . * : WISDOM TOOTH EXTRACTION * : * : 2012 Allergies:Patient has no known allergies.Medications: Prior to Admission medications Medication * : Sig * : Start Date * : End Date * : Taking? * : Authorizing Provider alum & mag hydroxide-simeth (MAALOX/MYLANTA) 200-200-20 MG/5ML suspension * : Take 15 mLs by mouth every 6 (six) hours as needed for indigestion or heartburn. * : 01/21/24 * : * : * : Nivia Colon, PA-C amLODipine  (NORVASC ) 10 MG tablet * : Take 10 mg by mouth daily. * : 08/08/23 * : * : * : [provider] atenolol  (TENORMIN ) 25 MG tablet * : Take 1 tablet by mouth daily. * : 10/17/23 * : 10/16/24 * : * : [provider] baclofen  (LIORESAL ) 10 MG tablet * : Take 10 mg by mouth 3 (three) times daily. * : 10/17/23 * : * : * : [provider] BENLYSTA 200 MG/ML SOAJ * : Inject 200 mg into the skin once a week. * : 02/03/19 * : * : * : [provider] famotidine  (PEPCID ) 40 MG tablet * : Take 1 tablet (40 mg total) by mouth 2 (two)  times daily. * : 01/24/24 * : * : * :  Zehr, Jessica D, PA-C FIASP  100 UNIT/ML SOLN * : Inject 6 Units into the skin 3 (three) times daily as needed (high blood sugar).Patient not taking: Reported on 02/02/2024 * : 10/23/23 * : * : * : [provider] fluticasone  (FLONASE ) 50 MCG/ACT nasal spray * : Place 1 spray into both nostrils daily as needed for allergies. * : 05/30/23 * : * : * : [provider] hydrOXYzine  (ATARAX ) 25 MG tablet * : TAKE ONE TO TWO, AT BEDTIME AS NEEDED FOR ITCHING. * : 05/27/24 * : * : * : Avram Lupita BRAVO, MD Insulin  Glargine (BASAGLAR  KWIKPEN) 100 UNIT/ML * : Inject 40 Units into the skin in the morning.Patient not taking: Reported on 01/16/2024 * : 10/26/23 * : 01/26/24 * : * : [provider] meclizine  (ANTIVERT ) 25 MG tablet * : Take 1 tablet (25 mg total) by mouth 3 (three) times daily as needed for dizziness. * : 12/13/21 * : * : * : Venter, Margaux, PA-C omeprazole  (PRILOSEC) 40 MG capsule * : Take 40 mg by mouth daily. * : 01/10/24 * : 01/09/25 * : * : [provider] ondansetron  (ZOFRAN -ODT) 4 MG disintegrating tablet * : Take 1 tablet (4 mg total) by mouth every 6 (six) hours. * : 01/26/24 * : * : * : Zehr, Jessica D, PA-C tacrolimus  (PROGRAF ) 1 MG capsule * : Take 2-3 mg by mouth 2 (two) times daily. Take 3 capsules (3 mg) in the morning and Take 2 capsules (2 mg) at bedtime * : * : * : * : [provider] valACYclovir (VALTREX) 500 MG tablet * : Take 500 mg by mouth as needed (fever blisters). * : * : * : * : [provider] Vitamin D, Ergocalciferol, (DRISDOL) 1.25 MG (50000 UNIT) CAPS capsule * : Take 50,000 Units by mouth every 7 (seven) days. * : 03/01/21 * : * : * : [provider] Family History Problem * : Relation * : Age of Onset . * : Hypertension * : Mother * : . * : Hypertension * : Father * : . * : Healthy * : Brother * : . * : Healthy * : Brother * : . * : Hypertension * : Maternal Grandmother * : . * : Healthy * :  Daughter * : . * : Healthy * : Daughter * : . * : Colon cancer * : Neg Hx * : . * : Esophageal cancer * : Neg Hx * : . * : Stomach cancer * : Neg Hx * : Socioeconomic History . * : Marital status: * : Married * : * : Spouse name: * : Not on file . * : Number of children: * : 2 . * : Years of education: * : some college . * : Highest education level: * : Not on file Occupational History . * : Occupation: * : Home maker Tobacco Use . * : Smoking status: * : Never . * : Smokeless tobacco: * : Never Vaping Use . * : Vaping status: * : Never Used Substance and Sexual Activity . * : Alcohol use: * : Yes * : * : Alcohol/week: * : 1.0 standard drink of alcohol * : * : Types: * : 1 Glasses of wine per week * : * : Comment: social settings . * : Drug use: * : No . * : Sexual activity: * : Yes Other Topics * : Concern . * :  Not on file Social History Narrative * : Lives at home with husband and children. * : Right-handed. * : Caffeine use:  5 cups per day. Financial Resource Strain: High Risk (08/08/2023) * : Received from [facility] * : Overall Financial Resource Strain (CARDIA) * : . * : Difficulty of Paying Living Expenses: Hard Food Insecurity: Low Risk  (01/08/2024) * : Received from [facility] * : Hunger Vital Sign * : . * : Within the past 12 months, you worried that your food would run out before you got money to buy more: Never true * : . * : Within the past 12 months, the food you bought just didn't last and you didn't have money to get more. : Never true Transportation Needs: No Transportation Needs (01/08/2024) * : Received from [facility] * : Transportation * : . * : In the past 12 months, has lack of reliable transportation kept you from medical appointments, meetings, work or from getting things needed for daily living? : No Physical Activity: Insufficiently Active (08/08/2023) * : Received from [facility] * : Exercise Vital Sign * : . * : On average, how many days per week do you engage in moderate to  strenuous exercise (like a brisk walk)?: 1 day * : . * : On average, how many minutes do you engage in exercise at this level?: 20 min Stress: Stress Concern Present (08/08/2023) * : Received from [facility] * : [employer] of Occupational Health - Occupational Stress Questionnaire * : . * : Feeling of Stress : Very much Social Connections: Socially Integrated (08/08/2023) * : Received from [facility] * : Social Network * : . * : How would you rate your social network (family, work, friends)?: Good participation with social networks ECOG Status:2 - Symptomatic, <50% confined to bedReview of Systems: A 12 point ROS discussed and pertinent positives are indicated in the HPI above. All other systems are negative.Review of SystemsVital Signs:BP (!) 149/94 (BP Location: Left Arm, Patient Position: Sitting, Cuff Size: Normal)  Pulse 83  Temp 98.9 F (37.2 C) (Oral)  Resp 18 Physical ExamConstitutional: Oriented to person, place, and time. Well-developed and well-nourished. No distress. ? HENT: Head: Normocephalic and atraumatic. Eyes: Conjunctivae and EOM are normal. Right eye exhibits no discharge. Left eye exhibits no discharge. No scleral icterus. Neck: No JVD present. Pulmonary/Chest: Effort normal. No stridor. No respiratory distress. Abdomen: soft, non distendedNeurological: alert and oriented to person, place, and time. Skin: Skin is warm and dry. not diaphoretic. Psychiatric: normal mood and affect. behavior is normal. Judgment and thought content normal. Imaging: MR PELVIS W WO CONTRASTResult Date: 7/26/2025CLINICAL DATA: Uterine leiomyoma EXAM: MRI PELVIS WITHOUT AND WITH CONTRAST TECHNIQUE: Multiplanar multisequence MR imaging of the pelvis was performed both before and after administration of intravenous contrast. CONTRAST: 8 mL Vueway  gadolinium contrast IV COMPARISON: CT pelvis, 11/27/2023 FINDINGS: Urinary Tract: No abnormality visualized. Bowel: Unremarkable visualized pelvic bowel loops.  Vascular/Lymphatic: No pathologically enlarged lymph nodes. No significant vascular abnormality seen. Reproductive: Large exophytic fibroid arising from the anterior uterine fundus with heterogeneous internal contrast enhancement, measuring 8.4 x 7.2 x 7.3 cm (series 3, image 23, series 6, image 16). Additional much smaller intramural and subserosal fibroids of the uterine fundus. Multiple small cysts of the endometrium. Small nabothian cysts of the cervix. Small functional ovarian follicles. Small left-sided Bartholin cyst, incompletely imaged (series 3, image 40). Other: Trace free fluid in the pelvis. Musculoskeletal: No suspicious bone lesions identified. IMPRESSION: 1.  Large exophytic fibroid arising from the anterior uterine fundus with heterogeneous internal contrast enhancement, measuring 8.4 x 7.2 x 7.3 cm. 2. Additional much smaller intramural and subserosal fibroids of the uterine fundus. 3. Multiple small cysts of the endometrium. Electronically Signed By: Marolyn JONETTA Jaksch M.D. On: 06/15/2024 07:33 The exophytic fibroid has a broad based attachment.Labs:CBC: Recent Labs 01/06/25124201/08/25044801/09/25043603/02/251527 WBC11.4*9.48.75.4HGB11.1*10.9*11.5*11.8*HCT34.4*34.6*[id number] COAGS: Recent Labs 09/25/24043409/26/24041809/30/24110410/07/240759 INR1.01.11.00.9 BMP: Recent Labs 01/06/25124201/08/25044801/09/25043603/02/251527 [id number]*[id number] GLUCOSE236*275*278*[id number]*CALCIUM9.09.08.8*9.6CREATININE0.650.730.741.23*GFRNONAA>60>60>6056* LIVER FUNCTION TESTS: Recent Labs 10/07/24075912/08/24183001/06/25124203/02/251527 BILITOT3.3*0.70.41.1AST228*2614*22ALT473*112*3010ALKPHOS188*155*9060PROT6.66.96.77.6ALBUMIN3.63.4*3.2*4.1 TUMOR MARKERS: No results for input(s): AFPTM, CEA, CA199, CHROMGRNA in the last 8760 hours. Assessment and Plan:My impression is that this patient's menorrhagia and pain are likely secondary to uterine fibroids. We spent the  majority of the consultation discussing the pathophysiology of uterine leiomyomata, natural history, anticipated involution post menopause, and treatment options. We discussed myomectomy, hysterectomy, and uterine fibroid embolization. I described the technique of UFE, anticipated benefits, possible risks and complications including but not limited to bleeding, infection, vessel damage, nontarget embolization, and incomplete symptom relief. We discussed the 90% clinical success rate historically and at our experience with UFE. We discussed the post procedure course and time course of symptom resolution. We discussed the need for continued gynecologic care. She seemed to understand and did ask appropriate questions, which were answered.Based on her evaluation thus far, I think she would be an appropriate candidate for uterine fibroid embolization because of her symptomatology and uterine fibroids. The MRI shows multiple fibroids, no pedunculated fibroids on a narrow stalk.After our discussion, the patient was motivated proceed. Accordingly, we can schedule at her convenience as an outpatient to proceed with UFE.Thank you for this interesting consult. I greatly enjoyed meeting Michelle Rojas and look forward to participating in their care. A copy of this report was sent to the requesting provider on this date. Electronically Signed   By: JONETTA Faes M.D.   On: 06/28/2024 07:27   MR PELVIS W WO CONTRAST Result Date: 06/15/2024 CLINICAL DATA:  Uterine leiomyoma EXAM: MRI PELVIS WITHOUT AND WITH CONTRAST TECHNIQUE: Multiplanar multisequence MR imaging of the pelvis was performed both before and after administration of intravenous contrast. CONTRAST:  8 mL Vueway  gadolinium contrast IV COMPARISON:  CT pelvis, 11/27/2023 FINDINGS: Urinary Tract:  No abnormality visualized. Bowel:  Unremarkable visualized pelvic bowel loops. Vascular/Lymphatic: No pathologically enlarged lymph nodes. No significant vascular  abnormality seen. Reproductive: Large exophytic fibroid arising from the anterior uterine fundus with heterogeneous internal contrast enhancement, measuring 8.4 x 7.2 x 7.3 cm (series 3, image 23, series 6, image 16). Additional much smaller intramural and subserosal fibroids of the uterine fundus. Multiple small cysts of the endometrium. Small nabothian cysts of the cervix. Small functional ovarian follicles. Small left-sided Bartholin cyst, incompletely imaged (series 3, image 40). Other:  Trace free fluid in the pelvis. Musculoskeletal: No suspicious bone lesions identified. IMPRESSION: 1. Large exophytic fibroid arising from the anterior uterine fundus with heterogeneous internal contrast enhancement, measuring 8.4 x 7.2 x 7.3 cm. 2. Additional much smaller intramural and subserosal fibroids of the uterine fundus. 3. Multiple small cysts of the endometrium. Electronically Signed   By: Marolyn JONETTA Jaksch M.D.   On: 06/15/2024 07:33    Labs:  CBC: Recent Labs    11/27/23 1242 11/29/23 0448 11/30/23 0436 01/21/24 1527  WBC 11.4* 9.4 8.7 5.4  HGB 11.1* 10.9* 11.5* 11.8*  HCT 34.4* 34.6* 36.4 38.2  PLT 321 341 362 346    COAGS: Recent Labs    08/16/23 0434 08/17/23 0418 08/21/23 1104 08/28/23 0759  INR 1.0 1.1 1.0 0.9  BMP: Recent Labs    11/27/23 1242 11/29/23 0448 11/30/23 0436 01/21/24 1527  NA 137 135 133* 145  K 3.8 3.6 3.9 3.5  CL 104 100 100 110  CO2 25 26 22 25   GLUCOSE 236* 275* 278* 96  BUN 11 13 16  23*  CALCIUM 9.0 9.0 8.8* 9.6  CREATININE 0.65 0.73 0.74 1.23*  GFRNONAA >60 >60 >60 56*    LIVER FUNCTION TESTS: Recent Labs    08/28/23 0759 10/29/23 1830 11/27/23 1242 01/21/24 1527  BILITOT 3.3* 0.7 0.4 1.1  AST 228* 26 14* 22  ALT 473* 112* 30 10  ALKPHOS 188* 155* 90 60  PROT 6.6 6.9 6.7 7.6  ALBUMIN 3.6 3.4* 3.2* 4.1    TUMOR MARKERS: No results for input(s): AFPTM, CEA, CA199, CHROMGRNA in the last 8760 hours.  Assessment and Plan: 43  y.o. female with PMH sig for AIH, anxiety, fibromyalgia, GERD, HTN, lupus, mixed connective tissue disease and symptomatic uterine fibroids. She underwent consultation with Dr. Johann on 06/19/24 to discuss treatment options for her symptomatic uterine fibroids and was deemed an appropriate  candidate for bilateral uterine artery embolization. She presents today for the procedure. Risks and benefits of procedure were discussed with the patient including, but not limited to bleeding, infection, vascular injury or contrast induced renal failure.  This interventional procedure involves the use of X-rays and because of the nature of the planned procedure, it is possible that we will have prolonged use of X-ray fluoroscopy.  Potential radiation risks to you include (but are not limited to) the following: - A slightly elevated risk for cancer  several years later in life. This risk is typically less than 0.5% percent. This risk is low in comparison to the normal incidence of human cancer, which is 33% for women and 50% for men according to the American Cancer Society. - Radiation induced injury can include skin redness, resembling a rash, tissue breakdown / ulcers and hair loss (which can be temporary or permanent).   The likelihood of either of these occurring depends on the difficulty of the procedure and whether you are sensitive to radiation due to previous procedures, disease, or genetic conditions.   IF your procedure requires a prolonged use of radiation, you will be notified and given written instructions for further action.  It is your responsibility to monitor the irradiated area for the 2 weeks following the procedure and to notify your physician if you are concerned that you have suffered a radiation induced injury.    All of the patient's questions were answered, patient is agreeable to proceed.  Consent signed and in chart.      Thank you for allowing our service to participate in  Michelle Rojas 's care.  Electronically Signed: D. Franky Rakers, PA-C   07/06/2024, 12:03 PM      I spent a total of  30 Minutes   in face to face in clinical consultation, greater than 50% of which was counseling/coordinating care for bilateral uterine artery embolization

## 2024-07-08 ENCOUNTER — Other Ambulatory Visit: Payer: Self-pay | Admitting: Radiology

## 2024-07-08 ENCOUNTER — Other Ambulatory Visit (HOSPITAL_COMMUNITY): Payer: Self-pay

## 2024-07-08 ENCOUNTER — Encounter (HOSPITAL_COMMUNITY): Payer: Self-pay

## 2024-07-08 ENCOUNTER — Ambulatory Visit (HOSPITAL_COMMUNITY)
Admission: RE | Admit: 2024-07-08 | Discharge: 2024-07-08 | Disposition: A | Discharge status: Home / Self Care | Source: Ambulatory Visit | Attending: Interventional Radiology | Admitting: Interventional Radiology

## 2024-07-08 ENCOUNTER — Other Ambulatory Visit: Payer: Self-pay

## 2024-07-08 ENCOUNTER — Other Ambulatory Visit (HOSPITAL_COMMUNITY): Payer: Self-pay | Admitting: Interventional Radiology

## 2024-07-08 DIAGNOSIS — D259 Leiomyoma of uterus, unspecified: Secondary | ICD-10-CM

## 2024-07-08 HISTORY — PX: IR ANGIOGRAM SELECTIVE EACH ADDITIONAL VESSEL: IMG667

## 2024-07-08 HISTORY — PX: IR US GUIDE VASC ACCESS RIGHT: IMG2390

## 2024-07-08 HISTORY — PX: IR ANGIOGRAM PELVIS SELECTIVE OR SUPRASELECTIVE: IMG661

## 2024-07-08 HISTORY — PX: IR EMBO TUMOR ORGAN ISCHEMIA INFARCT INC GUIDE ROADMAPPING: IMG5449

## 2024-07-08 LAB — BASIC METABOLIC PANEL WITH GFR
Anion gap: 11 (ref 5–15)
BUN: 11 mg/dL (ref 6–20)
CO2: 26 mmol/L (ref 22–32)
Calcium: 9.2 mg/dL (ref 8.9–10.3)
Chloride: 105 mmol/L (ref 98–111)
Creatinine, Ser: 0.99 mg/dL (ref 0.44–1.00)
GFR, Estimated: >60 mL/min (ref >60)
Glucose, Bld: 95 mg/dL (ref 70–99)
Potassium: 3.7 mmol/L (ref 3.5–5.1)
Sodium: 142 mmol/L (ref 135–145)

## 2024-07-08 LAB — CBC WITH DIFFERENTIAL/PLATELET
Abs Immature Granulocytes: 0.01 K/uL (ref 0.00–0.07)
Basophils Absolute: 0 K/uL (ref 0.0–0.1)
Basophils Relative: 0 %
Eosinophils Absolute: 0 K/uL (ref 0.0–0.5)
Eosinophils Relative: 1 %
HCT: 35.1 % — ABNORMAL LOW (ref 36.0–46.0)
Hemoglobin: 10.8 g/dL — ABNORMAL LOW (ref 12.0–15.0)
Immature Granulocytes: 0 %
Lymphocytes Relative: 40 %
Lymphs Abs: 1.8 K/uL (ref 0.7–4.0)
MCH: 23.4 pg — ABNORMAL LOW (ref 26.0–34.0)
MCHC: 30.8 g/dL (ref 30.0–36.0)
MCV: 76 fL — ABNORMAL LOW (ref 80.0–100.0)
Monocytes Absolute: 0.5 K/uL (ref 0.1–1.0)
Monocytes Relative: 11 %
Neutro Abs: 2.1 K/uL (ref 1.7–7.7)
Neutrophils Relative %: 48 %
Platelets: 316 K/uL (ref 150–400)
RBC: 4.62 MIL/uL (ref 3.87–5.11)
RDW: 16.4 % — ABNORMAL HIGH (ref 11.5–15.5)
WBC: 4.6 K/uL (ref 4.0–10.5)
nRBC: 0 % (ref 0.0–0.2)

## 2024-07-08 LAB — POCT PREGNANCY, URINE: Preg Test, Ur: NEGATIVE

## 2024-07-08 LAB — PROTIME-INR
INR: 0.9 (ref 0.8–1.2)
Prothrombin Time: 12.2 s (ref 11.4–15.2)

## 2024-07-08 LAB — HCG, SERUM, QUALITATIVE: Preg, Serum: NEGATIVE

## 2024-07-08 MED ORDER — SODIUM CHLORIDE 0.9 % IV SOLN: 250.0000 mL | INTRAVENOUS | Status: DC | PRN

## 2024-07-08 MED ORDER — OXYCODONE HCL 5 MG PO TABS
10.0000 mg | ORAL_TABLET | Freq: Four times a day (QID) | ORAL | 0 refills | Status: AC | PRN
  Filled 2024-07-08: qty 30, 6d supply, fill #0

## 2024-07-08 MED ORDER — CEFAZOLIN SODIUM-DEXTROSE 2-4 GM/100ML-% IV SOLN
2.0000 g | INTRAVENOUS | Status: AC
  Administered 2024-07-08: 2 g via INTRAVENOUS
  Filled 2024-07-08: qty 100

## 2024-07-08 MED ORDER — DEXAMETHASONE SODIUM PHOSPHATE 10 MG/ML IJ SOLN
10.0000 mg | Freq: Once | INTRAMUSCULAR | Status: AC
  Administered 2024-07-08: 10 mg via INTRAVENOUS
  Filled 2024-07-08: qty 1

## 2024-07-08 MED ORDER — HYDROCODONE-ACETAMINOPHEN 7.5-325 MG PO TABS
1.0000 | ORAL_TABLET | Freq: Four times a day (QID) | ORAL | Status: DC | PRN
  Administered 2024-07-08: 1 via ORAL
  Filled 2024-07-08: qty 2
  Filled 2024-07-08: qty 1

## 2024-07-08 MED ORDER — PROMETHAZINE HCL 25 MG RE SUPP
25.0000 mg | Freq: Three times a day (TID) | RECTAL | Status: DC | PRN
  Administered 2024-07-08: 25 mg via RECTAL
  Filled 2024-07-08 (×2): qty 1

## 2024-07-08 MED ORDER — MIDAZOLAM HCL 2 MG/2ML IJ SOLN
INTRAMUSCULAR | Status: DC | PRN
  Administered 2024-07-08: 1 mg via INTRAVENOUS

## 2024-07-08 MED ORDER — LIDOCAINE HCL 1 % IJ SOLN
INTRAMUSCULAR | Status: AC
  Filled 2024-07-08: qty 20

## 2024-07-08 MED ORDER — KETOROLAC TROMETHAMINE 15 MG/ML IJ SOLN
INTRAMUSCULAR | Status: DC | PRN
  Administered 2024-07-08: 15 mg via INTRAVENOUS

## 2024-07-08 MED ORDER — ONDANSETRON HCL 4 MG/2ML IJ SOLN: 4.0000 mg | Freq: Four times a day (QID) | INTRAMUSCULAR | Status: DC | PRN

## 2024-07-08 MED ORDER — MIDAZOLAM HCL 2 MG/2ML IJ SOLN
INTRAMUSCULAR | Status: AC
  Filled 2024-07-08: qty 2

## 2024-07-08 MED ORDER — SODIUM CHLORIDE 0.9% FLUSH
3.0000 mL | INTRAVENOUS | Status: DC | PRN
Start: 2024-07-08 — End: 2024-07-09

## 2024-07-08 MED ORDER — LIDOCAINE HCL 1 % IJ SOLN
20.0000 mL | Freq: Once | INTRAMUSCULAR | Status: AC
  Administered 2024-07-08: 5 mL via INTRADERMAL

## 2024-07-08 MED ORDER — KETOROLAC TROMETHAMINE 30 MG/ML IJ SOLN
INTRAMUSCULAR | Status: AC
  Filled 2024-07-08: qty 1

## 2024-07-08 MED ORDER — FENTANYL CITRATE (PF) 100 MCG/2ML IJ SOLN
INTRAMUSCULAR | Status: DC | PRN
  Administered 2024-07-08: 50 ug via INTRAVENOUS

## 2024-07-08 MED ORDER — CHLORHEXIDINE GLUCONATE CLOTH 2 % EX PADS: 6.0000 | MEDICATED_PAD | Freq: Every day | CUTANEOUS | Status: DC

## 2024-07-08 MED ORDER — SODIUM CHLORIDE 0.9% FLUSH: 3.0000 mL | Freq: Two times a day (BID) | INTRAVENOUS | Status: DC

## 2024-07-08 MED ORDER — DOCUSATE SODIUM 100 MG PO CAPS: 100.0000 mg | ORAL_CAPSULE | Freq: Two times a day (BID) | ORAL | Status: DC

## 2024-07-08 MED ORDER — FENTANYL CITRATE (PF) 100 MCG/2ML IJ SOLN
INTRAMUSCULAR | Status: AC
  Filled 2024-07-08: qty 2

## 2024-07-08 MED ORDER — ACETAMINOPHEN 10 MG/ML IV SOLN
1000.0000 mg | Freq: Once | INTRAVENOUS | Status: AC
  Administered 2024-07-08: 1000 mg via INTRAVENOUS
  Filled 2024-07-08: qty 100

## 2024-07-08 MED ORDER — SODIUM CHLORIDE 0.9 % IV SOLN
8.0000 mg | Freq: Once | INTRAVENOUS | Status: AC
  Administered 2024-07-08: 8 mg via INTRAVENOUS
  Filled 2024-07-08: qty 4

## 2024-07-08 MED ORDER — SODIUM CHLORIDE 0.9 % IV SOLN: INTRAVENOUS | Status: DC

## 2024-07-08 MED ORDER — DOCUSATE SODIUM 100 MG PO CAPS
100.0000 mg | ORAL_CAPSULE | Freq: Two times a day (BID) | ORAL | 0 refills | Status: AC
  Filled 2024-07-08: qty 10, 5d supply, fill #0

## 2024-07-08 MED ORDER — IOHEXOL 300 MG/ML  SOLN
100.0000 mL | Freq: Once | INTRAMUSCULAR | Status: AC | PRN
  Administered 2024-07-08: 95 mL via INTRA_ARTERIAL

## 2024-07-08 MED ORDER — IBUPROFEN 800 MG PO TABS
800.0000 mg | ORAL_TABLET | Freq: Three times a day (TID) | ORAL | 0 refills | Status: AC | PRN
  Filled 2024-07-08: qty 12, 4d supply, fill #0

## 2024-07-08 MED ORDER — KETOROLAC TROMETHAMINE 30 MG/ML IJ SOLN: 30.0000 mg | Freq: Once | INTRAMUSCULAR | Status: AC

## 2024-07-08 MED ORDER — PROMETHAZINE HCL 25 MG PO TABS: 25.0000 mg | ORAL_TABLET | Freq: Three times a day (TID) | ORAL | Status: DC | PRN

## 2024-07-08 MED ORDER — ONDANSETRON HCL 4 MG PO TABS
4.0000 mg | ORAL_TABLET | Freq: Four times a day (QID) | ORAL | 0 refills | Status: AC
  Filled 2024-07-08: qty 20, 5d supply, fill #0

## 2024-07-08 NOTE — Progress Notes (Signed)
 1740 Phenergan  25 mg suppository inserted in rectum having nausea with emesis 150 cc. Greenish color. 1800 Emesis 150 cc greenish color. Resting less discomfort level 8.

## 2024-07-08 NOTE — Sedation Documentation (Signed)
 Patient is resting comfortably.

## 2024-07-08 NOTE — Discharge Instructions (Addendum)
 May resume home medications, stay well hydrated, avoid strenuous activity for 1 week; no sexual intercourse for 1 week; IR team will contact you for follow up with Dr. Johann in 4 weeks; call [phone]/ [phone] or [phone] with any questions  Discharge Instructions:   Please call Interventional Radiology clinic [phone] with any questions or concerns.  You may remove your dressing and shower tomorrow.  Take next dose of Hydrocodone  at 9 pm no t exceed 4000 mg's in 2400 hours.  Moderate Conscious Sedation, Adult, Care After This sheet gives you information about how to care for yourself after your procedure. Your health care provider may also give you more specific instructions. If you have problems or questions, contact your health care provider. What can I expect after the procedure? After the procedure, it is common to have: Sleepiness for several hours. Impaired judgment for several hours. Difficulty with balance. Vomiting if you eat too soon. Follow these instructions at home: For the time period you were told by your health care provider: Rest. Do not participate in activities where you could fall or become injured. Do not drive or use machinery. Do not drink alcohol. Do not take sleeping pills or medicines that cause drowsiness. Do not make important decisions or sign legal documents. Do not take care of children on your own. Eating and drinking  Follow the diet recommended by your health care provider. Drink enough fluid to keep your urine pale yellow. If you vomit: Drink water, juice, or soup when you can drink without vomiting. Make sure you have little or no nausea before eating solid foods. General instructions Take over-the-counter and prescription medicines only as told by your health care provider. Have a responsible adult stay with you for the time you are told. It is important to have someone help care for you until you are awake and alert. Do not  smoke. Keep all follow-up visits as told by your health care provider. This is important. Contact a health care provider if: You are still sleepy or having trouble with balance after 24 hours. You feel light-headed. You keep feeling nauseous or you keep vomiting. You develop a rash. You have a fever. You have redness or swelling around the IV site. Get help right away if: You have trouble breathing. You have new-onset confusion at home. Summary After the procedure, it is common to feel sleepy, have impaired judgment, or feel nauseous if you eat too soon. Rest after you get home. Know the things you should not do after the procedure. Follow the diet recommended by your health care provider and drink enough fluid to keep your urine pale yellow. Get help right away if you have trouble breathing or new-onset confusion at home. This information is not intended to replace advice given to you by your health care provider. Make sure you discuss any questions you have with your health care provider. Document Revised: 03/06/2020 Document Reviewed: 10/03/2019 Elsevier Patient Education  2023 [employer].

## 2024-07-08 NOTE — Procedures (Signed)
  Procedure:  Bilat uterine artery embolization  Rx2, Lx1 via R CFA Preprocedure diagnosis: The encounter diagnosis was Uterine leiomyoma, unspecified location. Postprocedure diagnosis: same EBL:    minimal Complications:   none immediate  See full dictation in [employer].  CHARM Toribio Faes MD Main # [phone] Pager  [phone] Mobile [phone]

## 2024-07-08 NOTE — Progress Notes (Signed)
 Patient ID: Michelle Rojas, female   DOB: [birth year], 43 y.o.   MRN: [id number] Pt doing fairly well; has some mild pelvic discomfort; no fever,N/V; BP elevated; rt groin puncture site clean and dry, NT, no hematoma; intact distal pulses; yellow urine in foley cath; will tent plan dc home later today; f/u with Dr. Johann in IR clinic in 4 weeks; electronic prescriptions for zofran , colace, oxycodone  and motrin  sent to [facility]; discharge instructions reviewed with pt/spouse

## 2024-07-08 NOTE — Sedation Documentation (Signed)
 Celt vascular closure device deployed by Dr Johann.

## 2024-07-08 NOTE — Progress Notes (Signed)
 1620 Spoke with Dr. Toribio Faes, Michelle Rojas has a pain level of 9, cramping to her lower abdominal. Ordered Hydrocodone  7.5-325 mg po. 1700 Hydrocodone  7.5 -325 mg given po given for lower abdominal cramping at level 9. 1715 Spoke with Dr. Faes updated about elevated blood pressure.  Gave permission to give Phenergan  suppository.

## 2024-08-09 ENCOUNTER — Telehealth

## 2024-08-19 ENCOUNTER — Inpatient Hospital Stay
Admission: RE | Admit: 2024-08-19 | Discharge: 2024-08-19 | Disposition: A | Discharge status: Home / Self Care | Source: Ambulatory Visit | Attending: Radiology | Admitting: Radiology

## 2024-08-19 DIAGNOSIS — D259 Leiomyoma of uterus, unspecified: Secondary | ICD-10-CM

## 2024-08-19 NOTE — Progress Notes (Signed)
 Patient ID: Michelle Rojas, female   DOB: [birth year], 43 y.o.   MRN: [id number]       Chief Complaint: Patient was seen in consultation today for f/u UFE at the request of Allred,Darrell K  Referring Physician(s): Morris,Megan A   History of Present Illness: Michelle Rojas is a 43 y.o. female G3P2 history of uterine fibroids symptomatic x5 years with significant menorrhagia for up to 30d, no anemia. TYpical menstrual cycle q month lasting 5-7days, 4 heavy days. Users both tampons and pads with Depends, changes q 7-8d with interperiod bleeding. Pain with her period. Some crampy bulk symptoms and pressure. No prior fibroid therapies or surgeries. No gyn infections. No plans for future pregnancy (IUD removed 1 yr ago).  05/09/2019. Normal pap smear.  No endometrial biopsy.  Surgery was planned but delayed due to autoimmune hepatitis. 06/13/24 MR pelvis confirmed Large exophytic fibroid arising from the anterior uterine fundus with heterogeneous internal contrast enhancement, measuring 8.4 x 7.2 x 7.3 cm.  Additional much smaller intramural and subserosal fibroids of the uterine fundus. 07/08/24 underwent technically success uterine fibroid embolization via R CFA approach. Anatomic variant of 1 left and 2 right uterine arteries.   She had moderate post embolization syndrome symptoms, which have resolved. No abnormal uterine bleeding. No passing tissue fragments or clots.   Past Medical History:  Diagnosis Date   AMA + Autoimmune hepatitis (HCC) 08/10/2023   Anxiety    PP treated with counseling no meds   Fibromyalgia    GERD (gastroesophageal reflux disease)    History of palpitations    negative work-up by cardiologist-- dr delford-- 2015   Hypertension    taking labetalol    Lupus (systemic lupus erythematosus) (HCC)    MCTD (mixed connective tissue disease)    dx 07/ 2015--  rheumotologist--  dr jon jacob   Nausea    Numbness and tingling    Wears contact lenses     Past  Surgical History:  Procedure Laterality Date   BIOPSY STOMACH     DILATION AND EVACUATION N/A 06/17/2016   Procedure: DILATATION AND EVACUATION;  Surgeon: Duwaine Blumenthal, DO;  Location: [facility];  Service: Gynecology;  Laterality: N/A;  WTIH SUCTION   IR ANGIOGRAM PELVIS SELECTIVE OR SUPRASELECTIVE  07/08/2024   IR ANGIOGRAM SELECTIVE EACH ADDITIONAL VESSEL  07/08/2024   IR ANGIOGRAM SELECTIVE EACH ADDITIONAL VESSEL  07/08/2024   IR ANGIOGRAM SELECTIVE EACH ADDITIONAL VESSEL  07/08/2024   IR EMBO TUMOR ORGAN ISCHEMIA INFARCT INC GUIDE ROADMAPPING  07/08/2024   IR RADIOLOGIST EVAL & MGMT  06/19/2024   IR US  GUIDE VASC ACCESS RIGHT  07/08/2024   IRRIGATION AND DEBRIDEMENT ABSCESS N/A 11/28/2023   Procedure: IRRIGATION AND DEBRIDEMENT ABSCESS;  Surgeon: Vernetta Berg, MD;  Location: [facility];  Service: General;  Laterality: N/A;   LIVER BIOPSY     WISDOM TOOTH EXTRACTION  2012    Allergies: Patient has no known allergies.  Medications: Prior to Admission medications   Medication Sig Start Date End Date Taking? Authorizing Provider  alum & mag hydroxide-simeth (MAALOX/MYLANTA) 200-200-20 MG/5ML suspension Take 15 mLs by mouth every 6 (six) hours as needed for indigestion or heartburn. 01/21/24   Nivia Colon, PA-C  amLODipine  (NORVASC ) 10 MG tablet Take 10 mg by mouth daily. 08/08/23   [provider]  atenolol  (TENORMIN ) 25 MG tablet Take 1 tablet by mouth daily. 10/17/23 10/16/24  [provider]  baclofen  (LIORESAL ) 10 MG tablet Take 10 mg by mouth 3 (  three) times daily. 10/17/23   [provider]  BENLYSTA 200 MG/ML SOAJ Inject 200 mg into the skin once a week. 02/03/19   [provider]  docusate sodium  (COLACE) 100 MG capsule Take 1 capsule (100 mg total) by mouth 2 (two) times daily. 07/08/24   Allred, Darrell K, PA-C  famotidine  (PEPCID ) 40 MG tablet Take 1 tablet (40 mg total) by mouth 2 (two) times daily. 01/24/24   Zehr, Jessica D, PA-C   FIASP  100 UNIT/ML SOLN Inject 6 Units into the skin 3 (three) times daily as needed (high blood sugar). Patient not taking: Reported on 02/02/2024 10/23/23   [provider]  fluticasone  (FLONASE ) 50 MCG/ACT nasal spray Place 1 spray into both nostrils daily as needed for allergies. 05/30/23   [provider]  hydrOXYzine  (ATARAX ) 25 MG tablet TAKE ONE TO TWO, AT BEDTIME AS NEEDED FOR ITCHING. 05/27/24   Avram Lupita BRAVO, MD  Insulin  Glargine (BASAGLAR  [facility]) 100 UNIT/ML Inject 40 Units into the skin in the morning. Patient not taking: Reported on 01/16/2024 10/26/23 01/26/24  [provider]  meclizine  (ANTIVERT ) 25 MG tablet Take 1 tablet (25 mg total) by mouth 3 (three) times daily as needed for dizziness. 12/13/21   Venter, Margaux, PA-C  omeprazole  (PRILOSEC) 40 MG capsule Take 40 mg by mouth daily. 01/10/24 01/09/25  [provider]  ondansetron  (ZOFRAN ) 4 MG tablet Take 1 tablet (4 mg total) by mouth every 6 (six) hours. 07/08/24   Allred, Darrell K, PA-C  ondansetron  (ZOFRAN -ODT) 4 MG disintegrating tablet Take 1 tablet (4 mg total) by mouth every 6 (six) hours. 01/26/24   Zehr, Jessica D, PA-C  oxyCODONE  (OXY IR/ROXICODONE ) 5 MG immediate release tablet Take 2 tablets (10 mg total) by mouth every 6 (six) hours as needed. 07/08/24   Allred, Darrell K, PA-C  tacrolimus  (PROGRAF ) 1 MG capsule Take 2-3 mg by mouth 2 (two) times daily. Take 3 capsules (3 mg) in the morning and Take 2 capsules (2 mg) at bedtime    [provider]  valACYclovir (VALTREX) 500 MG tablet Take 500 mg by mouth as needed (fever blisters).    [provider]  Vitamin D, Ergocalciferol, (DRISDOL) 1.25 MG (50000 UNIT) CAPS capsule Take 50,000 Units by mouth every 7 (seven) days. 03/01/21   [provider]     Family History  Problem Relation Age of Onset   Hypertension Mother    Hypertension Father    Healthy Brother    Healthy Brother    Hypertension Maternal  Grandmother    Healthy Daughter    Healthy Daughter    Colon cancer Neg Hx    Esophageal cancer Neg Hx    Stomach cancer Neg Hx     Social History   Socioeconomic History   Marital status: Married    Spouse name: Not on file   Number of children: 2   Years of education: some college   Highest education level: Not on file  Occupational History   Occupation: Home maker  Tobacco Use   Smoking status: Never   Smokeless tobacco: Never  Vaping Use   Vaping status: Never Used  Substance and Sexual Activity   Alcohol use: Yes    Alcohol/week: 1.0 standard drink of alcohol    Types: 1 Glasses of wine per week    Comment: social settings   Drug use: No   Sexual activity: Yes  Other Topics Concern   Not on file  Social History Narrative  Lives at home with husband and children.   Right-handed.   Caffeine use:  5 cups per day.   Social Drivers of Health   Financial Resource Strain: High Risk (08/08/2023)   Received from [facility]   Overall Financial Resource Strain (CARDIA)    Difficulty of Paying Living Expenses: Hard  Food Insecurity: Low Risk  (01/08/2024)   Received from [facility]   Hunger Vital Sign    Within the past 12 months, you worried that your food would run out before you got money to buy more: Never true    Within the past 12 months, the food you bought just didn't last and you didn't have money to get more. : Never true  Transportation Needs: No Transportation Needs (01/08/2024)   Received from [facility]    In the past 12 months, has lack of reliable transportation kept you from medical appointments, meetings, work or from getting things needed for daily living? : No  Physical Activity: Insufficiently Active (08/08/2023)   Received from [facility]   Exercise Vital Sign    On average, how many days per week do you engage in moderate to strenuous exercise (like a brisk walk)?: 1 day    On average, how many minutes do you engage in  exercise at this level?: 20 min  Stress: Stress Concern Present (08/08/2023)   Received from [facility] Occup[facility] - Occupational Stress Questionnaire    Feeling of Stress : Very much  Social Connections: Socially Integrated (08/08/2023)   Received from [facility]   Social Network    How would you rate your social network (family, work, friends)?: Good participation with social networks    ECOG Status: 1 - Symptomatic but completely ambulatory  Review of Systems: A 12 point ROS discussed and pertinent positives are indicated in the HPI above.  All other systems are negative.  Review of Systems  Vital Signs: There were no vitals taken for this visit.    Physical Exam  Mallampati Score:     Imaging: No results found.  Labs:  CBC: Recent Labs    11/29/23 0448 11/30/23 0436 01/21/24 1527 07/08/24 1200  WBC 9.4 8.7 5.4 4.6  HGB 10.9* 11.5* 11.8* 10.8*  HCT 34.6* 36.4 38.2 35.1*  PLT 341 362 346 316    COAGS: Recent Labs    08/21/23 1104 08/28/23 0759 07/08/24 1200  INR 1.0 0.9 0.9    BMP: Recent Labs    11/29/23 0448 11/30/23 0436 01/21/24 1527 07/08/24 1200  NA 135 133* 145 142  K 3.6 3.9 3.5 3.7  CL 100 100 110 105  CO2 26 22 25 26   GLUCOSE 275* 278* 96 95  BUN 13 16 23* 11  CALCIUM 9.0 8.8* 9.6 9.2  CREATININE 0.73 0.74 1.23* 0.99  GFRNONAA >60 >60 56* >60    LIVER FUNCTION TESTS: Recent Labs    08/28/23 0759 10/29/23 1830 11/27/23 1242 01/21/24 1527  BILITOT 3.3* 0.7 0.4 1.1  AST 228* 26 14* 22  ALT 473* 112* 30 10  ALKPHOS 188* 155* 90 60  PROT 6.6 6.9 6.7 7.6  ALBUMIN 3.6 3.4* 3.2* 4.1    TUMOR MARKERS: No results for input(s): AFPTM, CEA, CA199, CHROMGRNA in the last 8760 hours.  Assessment and Plan:  My impression is that this patient has done very well in the first  month post uterine fibroid embolization.  Her relatively   post embolization syndrome seems  to been well  controlled and she is back to full activity.  We discussed the expectation that the fibroids will continue to involute over 3-6 months with continued improvement of symptoms over that timeframe.  I have no activity restrictions for her at this time.  I did again caution her that should she notice passage of any tissue fragments which might suggest fibroid fragmentation instead of involution, she would let need to let me know ASAP as this would put her at risk for possible infection and could be easily treated with D&C, a relatively unusual delayed complication but worth being aware of.  Otherwise, we will will follow up with her at the 64-month mark to make sure she is doing well, probably with MRI.  She knows to call in the interval should she have any questions or problems possibly related to the uterine fibroid embolization procedure. I reminded her to continue her routine schedule gynecologic care as well.    Thank you for this interesting consult.  I greatly enjoyed meeting Ladeidra A Jaggers and look forward to participating in their care.  A copy of this report was sent to the requesting provider on this date.  Electronically Signed: Dayne Jacqueline Delapena 08/19/2024, 2:29 PM   I spent a total of    25 Minutes in face to face in clinical consultation, greater than 50% of which was counseling/coordinating care for fibroids, post [place].

## 2025-01-22 ENCOUNTER — Ambulatory Visit: Admitting: Gastroenterology
